# Patient Record
Sex: Female | Born: 1950
Health system: Southern US, Community
[De-identification: ages and names within clinical notes are randomized; demographics above are authoritative.]

## PROBLEM LIST (undated history)

## (undated) DIAGNOSIS — Z8742 Personal history of other diseases of the female genital tract: Secondary | ICD-10-CM

## (undated) DIAGNOSIS — C689 Malignant neoplasm of urinary organ, unspecified: Secondary | ICD-10-CM

## (undated) DIAGNOSIS — T4145XA Adverse effect of unspecified anesthetic, initial encounter: Secondary | ICD-10-CM

## (undated) DIAGNOSIS — F32A Depression, unspecified: Secondary | ICD-10-CM

## (undated) DIAGNOSIS — I2510 Atherosclerotic heart disease of native coronary artery without angina pectoris: Secondary | ICD-10-CM

## (undated) DIAGNOSIS — Z9989 Dependence on other enabling machines and devices: Secondary | ICD-10-CM

## (undated) DIAGNOSIS — T8859XA Other complications of anesthesia, initial encounter: Secondary | ICD-10-CM

## (undated) DIAGNOSIS — IMO0002 Reserved for concepts with insufficient information to code with codable children: Secondary | ICD-10-CM

## (undated) DIAGNOSIS — T7840XA Allergy, unspecified, initial encounter: Secondary | ICD-10-CM

## (undated) DIAGNOSIS — Z9889 Other specified postprocedural states: Secondary | ICD-10-CM

## (undated) DIAGNOSIS — E119 Type 2 diabetes mellitus without complications: Secondary | ICD-10-CM

## (undated) DIAGNOSIS — K219 Gastro-esophageal reflux disease without esophagitis: Secondary | ICD-10-CM

## (undated) DIAGNOSIS — J449 Chronic obstructive pulmonary disease, unspecified: Secondary | ICD-10-CM

## (undated) DIAGNOSIS — M1990 Unspecified osteoarthritis, unspecified site: Secondary | ICD-10-CM

## (undated) DIAGNOSIS — G4733 Obstructive sleep apnea (adult) (pediatric): Secondary | ICD-10-CM

## (undated) DIAGNOSIS — Z872 Personal history of diseases of the skin and subcutaneous tissue: Secondary | ICD-10-CM

## (undated) DIAGNOSIS — G4730 Sleep apnea, unspecified: Secondary | ICD-10-CM

## (undated) DIAGNOSIS — F329 Major depressive disorder, single episode, unspecified: Secondary | ICD-10-CM

## (undated) DIAGNOSIS — B029 Zoster without complications: Secondary | ICD-10-CM

## (undated) DIAGNOSIS — Z8719 Personal history of other diseases of the digestive system: Secondary | ICD-10-CM

## (undated) DIAGNOSIS — K5730 Diverticulosis of large intestine without perforation or abscess without bleeding: Secondary | ICD-10-CM

## (undated) DIAGNOSIS — E785 Hyperlipidemia, unspecified: Secondary | ICD-10-CM

## (undated) DIAGNOSIS — R112 Nausea with vomiting, unspecified: Secondary | ICD-10-CM

## (undated) DIAGNOSIS — F419 Anxiety disorder, unspecified: Secondary | ICD-10-CM

## (undated) HISTORY — DX: Gastro-esophageal reflux disease without esophagitis: K21.9

## (undated) HISTORY — PX: ESOPHAGOGASTRODUODENOSCOPY: SHX1529

## (undated) HISTORY — DX: Major depressive disorder, single episode, unspecified: F32.9

## (undated) HISTORY — DX: Atherosclerotic heart disease of native coronary artery without angina pectoris: I25.10

## (undated) HISTORY — DX: Obstructive sleep apnea (adult) (pediatric): Z99.89

## (undated) HISTORY — DX: Anxiety disorder, unspecified: F41.9

## (undated) HISTORY — DX: Obstructive sleep apnea (adult) (pediatric): G47.33

## (undated) HISTORY — PX: COLONOSCOPY: SHX174

## (undated) HISTORY — DX: Type 2 diabetes mellitus without complications: E11.9

## (undated) HISTORY — DX: Depression, unspecified: F32.A

## (undated) HISTORY — DX: Zoster without complications: B02.9

## (undated) HISTORY — DX: Sleep apnea, unspecified: G47.30

## (undated) HISTORY — DX: Malignant neoplasm of urinary organ, unspecified: C68.9

## (undated) HISTORY — DX: Allergy, unspecified, initial encounter: T78.40XA

## (undated) HISTORY — PX: SPINE SURGERY: SHX786

---

## 1981-08-26 HISTORY — PX: ABDOMINAL HYSTERECTOMY: SHX81

## 1998-10-05 ENCOUNTER — Inpatient Hospital Stay (HOSPITAL_COMMUNITY): Admission: AD | Admit: 1998-10-05 | Discharge: 1998-10-06 | Payer: Self-pay | Admitting: Cardiology

## 1998-10-06 HISTORY — PX: CARDIOVASCULAR STRESS TEST: SHX262

## 2000-04-07 ENCOUNTER — Ambulatory Visit (HOSPITAL_COMMUNITY): Admission: RE | Admit: 2000-04-07 | Discharge: 2000-04-07 | Payer: Self-pay | Admitting: Gastroenterology

## 2000-04-07 ENCOUNTER — Encounter: Payer: Self-pay | Admitting: Gastroenterology

## 2000-05-09 ENCOUNTER — Encounter: Payer: Self-pay | Admitting: Gastroenterology

## 2000-05-09 ENCOUNTER — Ambulatory Visit (HOSPITAL_COMMUNITY): Admission: RE | Admit: 2000-05-09 | Discharge: 2000-05-09 | Payer: Self-pay | Admitting: Gastroenterology

## 2000-10-03 ENCOUNTER — Encounter (INDEPENDENT_AMBULATORY_CARE_PROVIDER_SITE_OTHER): Payer: Self-pay

## 2000-10-03 ENCOUNTER — Other Ambulatory Visit: Admission: RE | Admit: 2000-10-03 | Discharge: 2000-10-03 | Payer: Self-pay | Admitting: Otolaryngology

## 2000-10-24 HISTORY — PX: SEPTOPLASTY: SUR1290

## 2001-10-16 ENCOUNTER — Other Ambulatory Visit: Admission: RE | Admit: 2001-10-16 | Discharge: 2001-10-16 | Payer: Self-pay | Admitting: Family Medicine

## 2001-12-15 ENCOUNTER — Encounter: Payer: Self-pay | Admitting: Gastroenterology

## 2001-12-15 ENCOUNTER — Ambulatory Visit (HOSPITAL_COMMUNITY): Admission: RE | Admit: 2001-12-15 | Discharge: 2001-12-15 | Payer: Self-pay | Admitting: Gastroenterology

## 2004-06-14 ENCOUNTER — Ambulatory Visit: Payer: Self-pay | Admitting: Family Medicine

## 2004-08-17 ENCOUNTER — Ambulatory Visit: Payer: Self-pay | Admitting: Family Medicine

## 2004-09-24 ENCOUNTER — Ambulatory Visit: Payer: Self-pay | Admitting: Family Medicine

## 2004-10-22 ENCOUNTER — Ambulatory Visit: Payer: Self-pay | Admitting: Family Medicine

## 2005-01-22 ENCOUNTER — Ambulatory Visit: Payer: Self-pay | Admitting: Family Medicine

## 2005-09-23 ENCOUNTER — Ambulatory Visit: Payer: Self-pay | Admitting: Internal Medicine

## 2005-11-25 ENCOUNTER — Ambulatory Visit: Payer: Self-pay | Admitting: Family Medicine

## 2006-03-20 ENCOUNTER — Ambulatory Visit: Payer: Self-pay | Admitting: Family Medicine

## 2006-05-09 ENCOUNTER — Ambulatory Visit: Payer: Self-pay | Admitting: Family Medicine

## 2006-05-15 ENCOUNTER — Ambulatory Visit: Payer: Self-pay | Admitting: Family Medicine

## 2006-06-02 ENCOUNTER — Ambulatory Visit: Payer: Self-pay | Admitting: Family Medicine

## 2006-08-13 ENCOUNTER — Ambulatory Visit: Payer: Self-pay | Admitting: Chiropractic Medicine

## 2006-12-26 ENCOUNTER — Telehealth (INDEPENDENT_AMBULATORY_CARE_PROVIDER_SITE_OTHER): Payer: Self-pay | Admitting: *Deleted

## 2006-12-29 ENCOUNTER — Ambulatory Visit: Payer: Self-pay | Admitting: Family Medicine

## 2006-12-29 DIAGNOSIS — B029 Zoster without complications: Secondary | ICD-10-CM

## 2006-12-29 HISTORY — DX: Zoster without complications: B02.9

## 2007-01-23 ENCOUNTER — Telehealth: Payer: Self-pay | Admitting: Family Medicine

## 2007-05-26 ENCOUNTER — Telehealth (INDEPENDENT_AMBULATORY_CARE_PROVIDER_SITE_OTHER): Payer: Self-pay | Admitting: *Deleted

## 2007-06-30 ENCOUNTER — Ambulatory Visit: Payer: Self-pay | Admitting: Family Medicine

## 2007-06-30 ENCOUNTER — Telehealth: Payer: Self-pay | Admitting: Family Medicine

## 2007-07-20 ENCOUNTER — Telehealth (INDEPENDENT_AMBULATORY_CARE_PROVIDER_SITE_OTHER): Payer: Self-pay | Admitting: *Deleted

## 2007-09-03 ENCOUNTER — Telehealth (INDEPENDENT_AMBULATORY_CARE_PROVIDER_SITE_OTHER): Payer: Self-pay | Admitting: *Deleted

## 2007-09-22 ENCOUNTER — Ambulatory Visit: Payer: Self-pay | Admitting: Family Medicine

## 2008-03-15 ENCOUNTER — Encounter: Payer: Self-pay | Admitting: Family Medicine

## 2008-03-15 ENCOUNTER — Telehealth: Payer: Self-pay | Admitting: Family Medicine

## 2008-03-15 DIAGNOSIS — K219 Gastro-esophageal reflux disease without esophagitis: Secondary | ICD-10-CM | POA: Insufficient documentation

## 2008-03-15 DIAGNOSIS — F341 Dysthymic disorder: Secondary | ICD-10-CM | POA: Insufficient documentation

## 2008-03-15 DIAGNOSIS — E1169 Type 2 diabetes mellitus with other specified complication: Secondary | ICD-10-CM | POA: Insufficient documentation

## 2008-03-15 DIAGNOSIS — K2990 Gastroduodenitis, unspecified, without bleeding: Secondary | ICD-10-CM

## 2008-03-15 DIAGNOSIS — K2980 Duodenitis without bleeding: Secondary | ICD-10-CM | POA: Insufficient documentation

## 2008-03-15 DIAGNOSIS — K2970 Gastritis, unspecified, without bleeding: Secondary | ICD-10-CM | POA: Insufficient documentation

## 2008-03-15 DIAGNOSIS — Z87448 Personal history of other diseases of urinary system: Secondary | ICD-10-CM | POA: Insufficient documentation

## 2008-03-15 DIAGNOSIS — I10 Essential (primary) hypertension: Secondary | ICD-10-CM | POA: Insufficient documentation

## 2008-03-15 DIAGNOSIS — E7800 Pure hypercholesterolemia, unspecified: Secondary | ICD-10-CM

## 2008-06-28 ENCOUNTER — Ambulatory Visit: Payer: Self-pay | Admitting: Family Medicine

## 2008-07-05 ENCOUNTER — Encounter: Payer: Self-pay | Admitting: Family Medicine

## 2008-07-05 ENCOUNTER — Ambulatory Visit: Payer: Self-pay | Admitting: Family Medicine

## 2008-07-07 ENCOUNTER — Encounter (INDEPENDENT_AMBULATORY_CARE_PROVIDER_SITE_OTHER): Payer: Self-pay | Admitting: *Deleted

## 2008-08-29 ENCOUNTER — Ambulatory Visit: Payer: Self-pay | Admitting: Family Medicine

## 2008-11-08 ENCOUNTER — Ambulatory Visit: Payer: Self-pay | Admitting: Family Medicine

## 2008-11-08 LAB — CONVERTED CEMR LAB
ALT: 15 units/L (ref 0–35)
AST: 18 units/L (ref 0–37)
Albumin: 3.4 g/dL — ABNORMAL LOW (ref 3.5–5.2)
Alkaline Phosphatase: 68 units/L (ref 39–117)
BUN: 13 mg/dL (ref 6–23)
Basophils Absolute: 0 10*3/uL (ref 0.0–0.1)
Basophils Relative: 0.2 % (ref 0.0–3.0)
Bilirubin, Direct: 0 mg/dL (ref 0.0–0.3)
CO2: 32 meq/L (ref 19–32)
Calcium: 9 mg/dL (ref 8.4–10.5)
Chloride: 104 meq/L (ref 96–112)
Cholesterol: 177 mg/dL (ref 0–200)
Creatinine, Ser: 0.8 mg/dL (ref 0.4–1.2)
Eosinophils Absolute: 0.3 10*3/uL (ref 0.0–0.7)
Eosinophils Relative: 3.6 % (ref 0.0–5.0)
GFR calc non Af Amer: 77.88 mL/min (ref >60)
Glucose, Bld: 100 mg/dL — ABNORMAL HIGH (ref 70–99)
HCT: 38.8 % (ref 36.0–46.0)
HDL: 54.2 mg/dL (ref >39.00)
Hemoglobin: 13.4 g/dL (ref 12.0–15.0)
LDL Cholesterol: 86 mg/dL (ref 0–99)
Lymphocytes Relative: 25.2 % (ref 12.0–46.0)
Lymphs Abs: 2.2 10*3/uL (ref 0.7–4.0)
MCHC: 34.5 g/dL (ref 30.0–36.0)
MCV: 91.7 fL (ref 78.0–100.0)
Monocytes Absolute: 0.6 10*3/uL (ref 0.1–1.0)
Monocytes Relative: 7.2 % (ref 3.0–12.0)
Neutro Abs: 5.6 10*3/uL (ref 1.4–7.7)
Neutrophils Relative %: 63.8 % (ref 43.0–77.0)
Platelets: 246 10*3/uL (ref 150.0–400.0)
Potassium: 3.9 meq/L (ref 3.5–5.1)
RBC: 4.24 M/uL (ref 3.87–5.11)
RDW: 12.4 % (ref 11.5–14.6)
Sodium: 143 meq/L (ref 135–145)
TSH: 1.28 microintl units/mL (ref 0.35–5.50)
Total Bilirubin: 0.7 mg/dL (ref 0.3–1.2)
Total CHOL/HDL Ratio: 3
Total Protein: 7.1 g/dL (ref 6.0–8.3)
Triglycerides: 183 mg/dL — ABNORMAL HIGH (ref 0.0–149.0)
VLDL: 36.6 mg/dL (ref 0.0–40.0)
WBC: 8.7 10*3/uL (ref 4.5–10.5)

## 2008-11-15 ENCOUNTER — Encounter: Payer: Self-pay | Admitting: Family Medicine

## 2008-11-15 ENCOUNTER — Other Ambulatory Visit: Admission: RE | Admit: 2008-11-15 | Discharge: 2008-11-15 | Payer: Self-pay | Admitting: Family Medicine

## 2008-11-15 ENCOUNTER — Ambulatory Visit: Payer: Self-pay | Admitting: Family Medicine

## 2008-11-17 ENCOUNTER — Encounter (INDEPENDENT_AMBULATORY_CARE_PROVIDER_SITE_OTHER): Payer: Self-pay | Admitting: *Deleted

## 2008-11-22 ENCOUNTER — Ambulatory Visit: Payer: Self-pay | Admitting: Family Medicine

## 2008-11-22 ENCOUNTER — Encounter (INDEPENDENT_AMBULATORY_CARE_PROVIDER_SITE_OTHER): Payer: Self-pay | Admitting: *Deleted

## 2008-11-22 LAB — CONVERTED CEMR LAB
OCCULT 1: NEGATIVE
OCCULT 2: NEGATIVE
OCCULT 3: NEGATIVE

## 2009-02-07 ENCOUNTER — Ambulatory Visit: Payer: Self-pay | Admitting: Family Medicine

## 2009-04-28 ENCOUNTER — Ambulatory Visit: Payer: Self-pay | Admitting: Family Medicine

## 2009-04-28 LAB — CONVERTED CEMR LAB: Rapid Strep: NEGATIVE

## 2009-05-05 ENCOUNTER — Telehealth (INDEPENDENT_AMBULATORY_CARE_PROVIDER_SITE_OTHER): Payer: Self-pay | Admitting: Internal Medicine

## 2009-05-22 ENCOUNTER — Ambulatory Visit: Payer: Self-pay | Admitting: Family Medicine

## 2009-05-22 DIAGNOSIS — T7840XA Allergy, unspecified, initial encounter: Secondary | ICD-10-CM | POA: Insufficient documentation

## 2009-06-16 ENCOUNTER — Telehealth: Payer: Self-pay | Admitting: Internal Medicine

## 2009-08-03 ENCOUNTER — Encounter: Payer: Self-pay | Admitting: Family Medicine

## 2009-10-09 ENCOUNTER — Ambulatory Visit: Payer: Self-pay | Admitting: Family Medicine

## 2009-11-14 ENCOUNTER — Ambulatory Visit: Payer: Self-pay | Admitting: Family Medicine

## 2009-11-14 LAB — CONVERTED CEMR LAB
ALT: 15 units/L (ref 0–35)
Albumin: 3.4 g/dL — ABNORMAL LOW (ref 3.5–5.2)
Alkaline Phosphatase: 72 units/L (ref 39–117)
Basophils Relative: 0.7 % (ref 0.0–3.0)
Bilirubin, Direct: 0.1 mg/dL (ref 0.0–0.3)
CO2: 31 meq/L (ref 19–32)
Chloride: 105 meq/L (ref 96–112)
Creatinine, Ser: 0.8 mg/dL (ref 0.4–1.2)
Eosinophils Relative: 4 % (ref 0.0–5.0)
Hemoglobin: 11.7 g/dL — ABNORMAL LOW (ref 12.0–15.0)
LDL Cholesterol: 74 mg/dL (ref 0–99)
MCHC: 33.4 g/dL (ref 30.0–36.0)
MCV: 91.3 fL (ref 78.0–100.0)
Monocytes Absolute: 0.6 10*3/uL (ref 0.1–1.0)
Neutro Abs: 4.5 10*3/uL (ref 1.4–7.7)
Neutrophils Relative %: 58.5 % (ref 43.0–77.0)
Potassium: 4 meq/L (ref 3.5–5.1)
RBC: 3.84 M/uL — ABNORMAL LOW (ref 3.87–5.11)
Sodium: 141 meq/L (ref 135–145)
Total CHOL/HDL Ratio: 3
Total Protein: 7.2 g/dL (ref 6.0–8.3)
Triglycerides: 159 mg/dL — ABNORMAL HIGH (ref 0.0–149.0)
WBC: 7.8 10*3/uL (ref 4.5–10.5)

## 2009-11-20 ENCOUNTER — Ambulatory Visit: Payer: Self-pay | Admitting: Family Medicine

## 2009-12-08 ENCOUNTER — Ambulatory Visit: Payer: Self-pay | Admitting: Family Medicine

## 2009-12-08 LAB — CONVERTED CEMR LAB: OCCULT 1: NEGATIVE

## 2009-12-08 LAB — FECAL OCCULT BLOOD, GUAIAC: Fecal Occult Blood: NEGATIVE

## 2009-12-11 ENCOUNTER — Encounter (INDEPENDENT_AMBULATORY_CARE_PROVIDER_SITE_OTHER): Payer: Self-pay | Admitting: *Deleted

## 2009-12-19 ENCOUNTER — Telehealth: Payer: Self-pay | Admitting: Family Medicine

## 2009-12-27 ENCOUNTER — Ambulatory Visit: Payer: Self-pay | Admitting: Family Medicine

## 2010-01-02 ENCOUNTER — Ambulatory Visit: Payer: Self-pay | Admitting: Family Medicine

## 2010-01-02 ENCOUNTER — Encounter: Payer: Self-pay | Admitting: Family Medicine

## 2010-01-03 ENCOUNTER — Encounter (INDEPENDENT_AMBULATORY_CARE_PROVIDER_SITE_OTHER): Payer: Self-pay | Admitting: *Deleted

## 2010-03-01 ENCOUNTER — Ambulatory Visit: Payer: Self-pay | Admitting: Family Medicine

## 2010-03-01 DIAGNOSIS — J309 Allergic rhinitis, unspecified: Secondary | ICD-10-CM

## 2010-03-01 DIAGNOSIS — G4733 Obstructive sleep apnea (adult) (pediatric): Secondary | ICD-10-CM | POA: Insufficient documentation

## 2010-04-03 ENCOUNTER — Telehealth: Payer: Self-pay | Admitting: Family Medicine

## 2010-04-03 ENCOUNTER — Encounter (INDEPENDENT_AMBULATORY_CARE_PROVIDER_SITE_OTHER): Payer: Self-pay | Admitting: *Deleted

## 2010-04-05 ENCOUNTER — Ambulatory Visit: Payer: Self-pay | Admitting: Pulmonary Disease

## 2010-05-09 ENCOUNTER — Encounter: Payer: Self-pay | Admitting: Pulmonary Disease

## 2010-05-09 ENCOUNTER — Ambulatory Visit (HOSPITAL_BASED_OUTPATIENT_CLINIC_OR_DEPARTMENT_OTHER): Admission: RE | Admit: 2010-05-09 | Discharge: 2010-05-09 | Payer: Self-pay | Admitting: Pulmonary Disease

## 2010-05-25 ENCOUNTER — Telehealth (INDEPENDENT_AMBULATORY_CARE_PROVIDER_SITE_OTHER): Payer: Self-pay | Admitting: *Deleted

## 2010-05-25 ENCOUNTER — Ambulatory Visit: Payer: Self-pay | Admitting: Pulmonary Disease

## 2010-06-12 ENCOUNTER — Ambulatory Visit: Payer: Self-pay | Admitting: Pulmonary Disease

## 2010-07-23 ENCOUNTER — Ambulatory Visit: Payer: Self-pay | Admitting: Pulmonary Disease

## 2010-08-02 ENCOUNTER — Ambulatory Visit: Payer: Self-pay | Admitting: Family Medicine

## 2010-08-02 ENCOUNTER — Telehealth: Payer: Self-pay | Admitting: Family Medicine

## 2010-09-20 ENCOUNTER — Ambulatory Visit
Admission: RE | Admit: 2010-09-20 | Discharge: 2010-09-20 | Payer: Self-pay | Source: Home / Self Care | Attending: Family Medicine | Admitting: Family Medicine

## 2010-09-25 NOTE — Progress Notes (Signed)
Summary: pt needs ov with kc to discuss sleep study results.   Phone Note Outgoing Call   Call placed by: Carver Fila,  May 25, 2010 9:02 AM Call placed to: Patient Summary of Call: [facility] x1. Pt needs ov with kc to discuss sleep study results.  Carver Fila  May 25, 2010 9:02 AM   Follow-up for Phone Call        left message with husband to give me a call back to schedule a follow up with kc to discuss sleep study results.  Carver Fila  May 28, 2010 9:14 AM   Additional Follow-up for Phone Call Additional follow up Details #1::        [email or web address] x 3 Mindy Silva  May 29, 2010 5:22 PM   pt called back and is coming in on 10/18 at 11:15 a.m. Carver Fila  May 30, 2010 9:39 AM

## 2010-09-25 NOTE — Assessment & Plan Note (Signed)
Summary: CHECK ? MOLE ON BACK   Vital Signs:  Patient profile:   60 year old female Weight:      187.75 pounds Temp:     98.4 degrees F oral Pulse rate:   76 / minute Pulse rhythm:   regular BP sitting:   136 / 66  (left arm) Cuff size:   regular  Vitals Entered By: Sydell Axon LPN ([birth year] 28, 1882 12:01 PM) CC: Check mole on back, itches at times   History of Present Illness: Pt here for itchy lesion she can feel on her back which due to the itching, she has scratched for the last week or so. She cannot see it so wanted it checked. She has no other problems and feels well.  Problems Prior to Update: 1)  Uri  (ICD-465.9) 2)  Allergy  (ICD-995.3) 3)  Special Screening Malig Neoplasms Other Sites  (ICD-V76.49) 4)  Health Maintenance Exam  (ICD-V70.0) 5)  Other Screening Mammogram  (ICD-V76.12) 6)  Neck Pain w/ Ue Numbness  (ICD-723.1) 7)  Hematuria, Hx of  (ICD-V13.09) 8)  Duodenitis  (ICD-535.60) 9)  Gastritis  (ICD-535.50) 10)  Gerd  (ICD-530.81) 11)  Essential Hypertension  (ICD-401.9) 12)  Hypercholesterolemia  (ICD-272.0) 13)  Anxiety Depression  (ICD-300.4) 14)  Dermatitis  (ICD-692.9) 15)  Herpes Zoster, Uncomplicated  (ICD-053.9)  Medications Prior to Update: 1)  Lipitor 10 Mg  Tabs (Atorvastatin Calcium) .Marland Kitchen.. 1 By Mouth At Bedtime 2)  Prilosec 40 Mg  Cpdr (Omeprazole) .... 45 Mins Prior To Brkfst Daily By Mouth 3)  Menest 0.625 Mg  Tabs (Esterified Estrogens) .... One Tab By Mouth Daily 4)  Amlodipine Besylate 10 Mg Tabs (Amlodipine Besylate) .... One Tab By Mouth At Night 5)  Prozac 20 Mg  Caps (Fluoxetine Hcl) .Marland Kitchen.. 1 Daily By Mouth 6)  Maxzide-25 37.5-25 Mg  Tabs (Triamterene-Hctz) .... Take One By Mouth Daily 7)  Guaifenesin 400 Mg Tabs (Guaifenesin) .... As Needed 8)  Clarithromycin 500 Mg Tabs (Clarithromycin) .... One Tab By Mouth Two Times A Day  Allergies: 1)  ! Ampicillin (Ampicillin) 2)  ! Vibramycin (Doxycycline Hyclate) 3)  ! Doxycycline Hyclate  (Doxycycline Hyclate) 4)  ! Tetracycline Hcl (Tetracycline Hcl) 5)  ! Toprol Xl  Physical Exam  General:  alert, well-developed, well-nourished, and well-hydrated.  Mildly congested. Head:  Normocephalic and atraumatic without obvious abnormalities. No apparent alopecia or balding. Sinuses NT. Eyes:  Conjunctiva clear bilaterally.  Skin:  5mm maculopapulr poorly demarcated reasonably round flesh-colored lesion in the mid back. No dimpling noted.   Impression & Recommendations:  Problem # 1:  MOLE OF SKIN OF BACK (ICD-216.9) Assessment New Looks benign. Will follow. Reassured. Remind when next seen to look again.  Complete Medication List: 1)  Lipitor 10 Mg Tabs (Atorvastatin calcium) .Marland Kitchen.. 1 by mouth at bedtime 2)  Prilosec 40 Mg Cpdr (Omeprazole) .... 45 mins prior to brkfst daily by mouth 3)  Menest 0.625 Mg Tabs (Esterified estrogens) .... One tab by mouth daily 4)  Amlodipine Besylate 10 Mg Tabs (Amlodipine besylate) .... One tab by mouth at night 5)  Prozac 20 Mg Caps (Fluoxetine hcl) .Marland Kitchen.. 1 daily by mouth 6)  Maxzide-25 37.5-25 Mg Tabs (Triamterene-hctz) .... Take one by mouth daily 7)  Guaifenesin 400 Mg Tabs (Guaifenesin) .... As needed  Current Allergies (reviewed today): ! AMPICILLIN (AMPICILLIN) ! VIBRAMYCIN (DOXYCYCLINE HYCLATE) ! DOXYCYCLINE HYCLATE (DOXYCYCLINE HYCLATE) ! TETRACYCLINE HCL (TETRACYCLINE HCL) ! TOPROL XL

## 2010-09-25 NOTE — Progress Notes (Signed)
Summary: Rx Fluroxetine  Phone Note Refill Request Message from:  [facility] on April 03, 2010 8:42 AM  Refills Requested: Medication #1:  PROZAC 20 MG  CAPS 1 daily by mouth No last refill date sent.   Method Requested: Electronic Initial call taken by: Sydell Axon LPN,  April 03, 2010 8:42 AM  Follow-up for Phone Call       Follow-up by: Crawford Givens MD,  April 03, 2010 9:26 AM    Prescriptions: PROZAC 20 MG  CAPS (FLUOXETINE HCL) 1 daily by mouth  #90 x 3   Entered and Authorized by:   Crawford Givens MD   Signed by:   Crawford Givens MD on 04/03/2010   Method used:   Faxed to ...       [facility] (mail-order)             , [place]         Ph: [phone]       Fax: [phone]   RxID:   [id number]

## 2010-09-25 NOTE — Miscellaneous (Signed)
  Clinical Lists Changes  Orders: Added new Service order of Est. Patient Level IV (99214) - Signed 

## 2010-09-25 NOTE — Letter (Signed)
Summary: Results Follow up Letter  [place] at [facility]  [address] [place], [place] [address]   Phone: [phone]  Fax: [phone]    01/03/2010 MRN: [id number]  [facility] [address] [place], [place]  [address]  Dear Ms. Wessels,  The following are the results of your recent test(s):  Test         Result    Pap Smear:        Normal _____  Not Normal _____ Comments: ______________________________________________________ Cholesterol: LDL(Bad cholesterol):         Your goal is less than:         HDL (Good cholesterol):       Your goal is more than: Comments:  ______________________________________________________ Mammogram:        Normal _X____  Not Normal _____ Comments: Please repeat in one year.  ___________________________________________________________________ Hemoccult:        Normal _____  Not normal _______ Comments:    _____________________________________________________________________ Other Tests:    We routinely do not discuss normal results over the telephone.  If you desire a copy of the results, or you have any questions about this information we can discuss them at your next office visit.   Sincerely,     Laurita Quint, MD

## 2010-09-25 NOTE — Progress Notes (Signed)
Summary: needs order for mammogram  Phone Note Call from Patient Call back at [phone]   Caller: Patient Call For: Shaune Leeks MD Summary of Call: Pt needs order for mammogram, she goes to [facility] clinic.   Initial call taken by: Lowella Petties CMA,  December 19, 2009 11:46 AM  Follow-up for Phone Call        Mammogram ordered...left message to inform pt.Daine Gip  December 20, 2009 5:02 PM Follow-up by: Daine Gip,  December 20, 2009 5:02 PM

## 2010-09-25 NOTE — Assessment & Plan Note (Signed)
Summary: consult for possible osa   Copy to:  Karleen Hampshire Copland Primary Provider/Referring Provider:  Hannah Beat  CC:  Sleep Consult.  History of Present Illness: The pt is a 60 y/o female who I have been asked to see for possible osa.  She has been noted to have loud snoring, as well as pauses in her breathing during sleep.  She goes to bed at 11pm, and arises at 6:30 am to start her day.  She is not rested upon arising, and has frequent awakenings at night.  She has significant sleep pressure with inactivity during the day, and has fallen asleep in front of her computer at work.  She is not satisfied with her degree of alertness and concentration  during the day.  She can doze if she sits down in the evening, and gets sleepy driving longer distances.  Her weight is up 10 pounds over the last 2 years, and her epworth score today is 17.    Medications Prior to Update: 1)  Lipitor 10 Mg  Tabs (Atorvastatin Calcium) .Marland Kitchen.. 1 By Mouth At Bedtime 2)  Prilosec 40 Mg  Cpdr (Omeprazole) .... 45 Mins Prior To Brkfst Daily By Mouth 3)  Menest 0.625 Mg  Tabs (Esterified Estrogens) .... One Tab By Mouth Daily 4)  Amlodipine Besylate 10 Mg Tabs (Amlodipine Besylate) .... One Tab By Mouth At Night 5)  Prozac 20 Mg  Caps (Fluoxetine Hcl) .Marland Kitchen.. 1 Daily By Mouth 6)  Maxzide-25 37.5-25 Mg  Tabs (Triamterene-Hctz) .... Take One By Mouth Daily 7)  Guaifenesin 400 Mg Tabs (Guaifenesin) .... As Needed 8)  Fluticasone Propionate 50 Mcg/act  Susp (Fluticasone Propionate) .... 2 Sprays Each Nostril Once Daily  Allergies (verified): 1)  ! Ampicillin (Ampicillin) 2)  ! Vibramycin (Doxycycline Hyclate) 3)  ! Doxycycline Hyclate (Doxycycline Hyclate) 4)  ! Tetracycline Hcl (Tetracycline Hcl) 5)  ! Toprol Xl  Past History:  Past Medical History: Reviewed history from 03/01/2010 and no changes required. ALLERGY (ICD-995.3) GERD (ICD-530.81) ESSENTIAL HYPERTENSION (ICD-401.9) HYPERCHOLESTEROLEMIA  (ICD-272.0) ANXIETY DEPRESSION (ICD-300.4)    Past Surgical History: NSVD X 2 (BREECH, MACRO) HYSTERECTOMY OVARIES INTACT  ENDOMETRIOSIS :(1983) ABD. ULTRASOUND -- NORMAL:(02/2000) EGD HIATAL HERNIA , ESOPH. :(03/2000) SEPTOPLASTY WITHWINDOWS(?) DR. CROSSLEY:(10/2000) HEMATURIA- FH  TRANSITIONAL  CELL CA OF KIDNEY ? WORK UP NEG (DR. SCOTT DONALDSON )(03/27/2001) Appendectomy 1983  Family History: Father: dec 62  ANEURYSM ? IN THROAT; DM; + MI  Mother: A 34 :(1974) KIDNEY and  LYMPHOMA CA/ NEPRECTOMY TUMOR OF LUNG Now smoking, deceased, T-cell cancer in kidney BROTHER A 64  MI AT 50YOA ETOH Smoke again BROTHER A 49 T-Cell RENAL CANCER AT 38 YOA REMOVED SISTER A 61 ELEVATED CHOLESTEROL;   DM SMOKER CV: + GM MI// + FATHER MI M AUNTS MI BROTHER MILD MI HBP: + BROTHERS X 2, THROUGHOUT FAMILY DM: + SISTER// + FATHER GOUT/ARTHRITIS: PROSTATE CANCER: MOTHER AND BROTHER RENAL CELL , CANCER  //LYMPHOMA BREAST/OVARIAN/UTERINE CANCER:  COLON CANCER: DEPRESSION: + SELF ETOH/DRUG ABUSE: NEGATIVE OTHER : NEGATIVE STROKE  Social History: Marital Status: DIVORCED and now single. 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  2 ppd.  quit 1988.   Review of Systems       The patient complains of hand/feet swelling and joint stiffness or pain.  The patient denies shortness of breath with activity, shortness of  breath at rest, productive cough, non-productive cough, coughing up blood, chest pain, irregular heartbeats, acid heartburn, indigestion, loss of appetite, weight change, abdominal pain, difficulty swallowing, sore throat, tooth/dental problems, headaches, nasal congestion/difficulty breathing through nose, sneezing, itching, ear ache, anxiety, depression, rash, change in color of mucus, and fever.    Vital Signs:  Patient profile:   60  year old female Height:      64 inches Weight:      185.13 pounds BMI:     31.89 O2 Sat:      94 % on Room air Temp:     97.8 degrees F oral Pulse rate:   67 / minute BP sitting:   132 / 64  (left arm) Cuff size:   regular  Vitals Entered By: Arman Filter LPN (April 05, 2010 2:58 PM)  O2 Flow:  Room air CC: Sleep Consult Comments Medications reviewed with patient Arman Filter LPN  April 05, 2010 2:58 PM    Physical Exam  General:  ow female in nad Eyes:  PERRLA and EOMI.   Nose:  deviated septum to left with narrowing right clear Mouth:  mild to moderate elongation of soft palate, normal uvula Neck:  no jvd, tmg, LN Lungs:  clear to auscultation Heart:  rrr, no mrg Abdomen:  soft and nontender, bs+ Extremities:  no edema noted, pulses intact distally no cyanosis Neurologic:  alert and oriented, moves all 4.   Impression & Recommendations:  Problem # 1:  OBSTRUCTIVE SLEEP APNEA (ICD-327.23) the pt's history is very suggestive of osa.  She has loud snoring, an abnormal breathing pattern during sleep, nonrestorative sleep, and inappropriate daytime sleepiness.  I have had a long discussion with the pt about sleep apnea, including its impact on QOL and CV health.  I think she needs to have a sleep study for diagnosis, and the pt is agreeable.    Other Orders: Consultation Level IV (04540) Sleep Disorder Referral (Sleep Disorder)  Patient Instructions: 1)  will set up for a sleep study, and will schedule followup once results available.

## 2010-09-25 NOTE — Assessment & Plan Note (Signed)
Summary: rov for osa   Visit Type:  Follow-up Copy to:  Karleen Hampshire Copland Primary Provider/Referring Provider:  Karleen Hampshire Copland  CC:  5 week f/u. pt states she wears cpap 5 days a week x 4-6 hrs a night. pt states she has air seeping through the mask. Pt states her mask feels loose occas then feels to tight. .  History of Present Illness: The pt comes in today for f/u of her severe osa.  She was started on cpap last visit, and has been trying to wear compliantly.  She is having issues with mask fit and leaks, and this is hurting compliance.  When she is able to wear most of the night, she sees a big difference in quality of sleep and daytime alertness.  She has no issue with pressure or humidity.  I have reminded her that she is on a moderate pressure level, and that we still need to optimize this for her.  Current Medications (verified): 1)  Lipitor 10 Mg  Tabs (Atorvastatin Calcium) .Marland Kitchen.. 1 By Mouth At Bedtime 2)  Prilosec 40 Mg  Cpdr (Omeprazole) .... 45 Mins Prior To Brkfst Daily By Mouth 3)  Menest 0.625 Mg  Tabs (Esterified Estrogens) .... One Tab By Mouth Daily 4)  Amlodipine Besylate 10 Mg Tabs (Amlodipine Besylate) .... One Tab By Mouth At Night 5)  Prozac 20 Mg  Caps (Fluoxetine Hcl) .Marland Kitchen.. 1 Daily By Mouth 6)  Maxzide-25 37.5-25 Mg  Tabs (Triamterene-Hctz) .... Take One By Mouth Daily 7)  Guaifenesin 400 Mg Tabs (Guaifenesin) .... As Needed 8)  Fluticasone Propionate 50 Mcg/act  Susp (Fluticasone Propionate) .... 2 Sprays Each Nostril Once Daily  Allergies (verified): 1)  ! Ampicillin (Ampicillin) 2)  ! Vibramycin (Doxycycline Hyclate) 3)  ! Doxycycline Hyclate (Doxycycline Hyclate) 4)  ! Tetracycline Hcl (Tetracycline Hcl) 5)  ! Toprol Xl  Past History:  Past medical, surgical, family and social histories (including risk factors) reviewed, and no changes noted (except as noted below).  Past Medical History: Reviewed history from 03/01/2010 and no changes required. ALLERGY  (ICD-995.3) GERD (ICD-530.81) ESSENTIAL HYPERTENSION (ICD-401.9) HYPERCHOLESTEROLEMIA (ICD-272.0) ANXIETY DEPRESSION (ICD-300.4)    Past Surgical History: Reviewed history from 04/05/2010 and no changes required. NSVD X 2 (BREECH, MACRO) HYSTERECTOMY OVARIES INTACT  ENDOMETRIOSIS :(1983) ABD. ULTRASOUND -- NORMAL:(02/2000) EGD HIATAL HERNIA , ESOPH. :(03/2000) SEPTOPLASTY WITHWINDOWS(?) DR. CROSSLEY:(10/2000) HEMATURIA- FH  TRANSITIONAL  CELL CA OF KIDNEY ? WORK UP NEG (DR. SCOTT DONALDSON )(03/27/2001) Appendectomy 1983  Family History: Reviewed history from 04/05/2010 and no changes required. Father: dec 62  ANEURYSM ? IN THROAT; DM; + MI  Mother: A 48 :(1974) KIDNEY and  LYMPHOMA CA/ NEPRECTOMY TUMOR OF LUNG Now smoking, deceased, T-cell cancer in kidney BROTHER A 64  MI AT 50YOA ETOH Smoke again BROTHER A 49 T-Cell RENAL CANCER AT 38 YOA REMOVED SISTER A 61 ELEVATED CHOLESTEROL;   DM SMOKER CV: + GM MI// + FATHER MI M AUNTS MI BROTHER MILD MI HBP: + BROTHERS X 2, THROUGHOUT FAMILY DM: + SISTER// + FATHER GOUT/ARTHRITIS: PROSTATE CANCER: MOTHER AND BROTHER RENAL CELL , CANCER  //LYMPHOMA BREAST/OVARIAN/UTERINE CANCER:  COLON CANCER: DEPRESSION: + SELF ETOH/DRUG ABUSE: NEGATIVE OTHER : NEGATIVE STROKE  Social History: Reviewed history from 04/05/2010 and no changes required. Marital Status: DIVORCED and now single. 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  2 ppd.  quit 1988.   Review of Systems       The patient complains of productive cough, headaches, nasal congestion/difficulty breathing through nose, sneezing, and joint stiffness or pain.  The patient denies shortness of breath with activity, shortness of breath at rest, non-productive cough, coughing up blood, chest pain, irregular heartbeats, acid heartburn,  indigestion, loss of appetite, weight change, abdominal pain, difficulty swallowing, sore throat, tooth/dental problems, itching, ear ache, anxiety, depression, hand/feet swelling, rash, change in color of mucus, and fever.    Vital Signs:  Patient profile:   60 year old female Height:      64 inches Weight:      182.50 pounds BMI:     31.44 O2 Sat:      98 % on Room air Temp:     97.8 degrees F oral Pulse rate:   63 / minute BP sitting:   126 / 68  (left arm) Cuff size:   regular  Vitals Entered By: Carver Fila (July 23, 2010 12:07 PM)  O2 Flow:  Room air CC: 5 week f/u. pt states she wears cpap 5 days a week x 4-6 hrs a night. pt states she has air seeping through the mask. Pt states her mask feels loose occas then feels to tight.  Comments meds and allergies updated Phone number updated Carver Fila  July 23, 2010 12:08 PM    Physical Exam  General:  ow female in nad Nose:  no skin breakdown or pressure necrosis from cpap mask Lungs:  clear Extremities:  no edema noted, no cyanosis  Neurologic:  alert and oriented, does not appear sleepy moves all 4.   Impression & Recommendations:  Problem # 1:  OBSTRUCTIVE SLEEP APNEA (ICD-327.23) the pt is trying to wear cpap as much as possible, but is having issues with mask fit.  When she is able to wear consistently, her sleep and daytime alertness is much improved.  We need to work on mask fit, and will send her to the sleep center for evaluation.  Once this is done, will need to optimize pressure for her with auto setting.  I have also encouraged the pt to work on weight loss. Care Plan:  At this point, will arrange for the patient's machine to be changed over to auto mode for 2 weeks to optimize their pressure.  I will review the downloaded data once sent by dme, and also evaluate for compliance, leaks, and residual osa.  I will call the patient and dme to discuss the results, and have the patient's machine set appropriately.   This will serve as the pt's cpap pressure titration.  Other Orders: Est. Patient Level IV ([address]) DME Referral (DME) Sleep Disorder Referral (Sleep Disorder)  Patient Instructions: 1)  will refer to sleep center to work on mask fit 2)  will have dme change your machine over to automatic mode for the next 2 weeks to optimize your pressure.  Will call you with the results. 3)  work on weight loss 4)  if doing well, will see you back in 6mos.

## 2010-09-25 NOTE — Letter (Signed)
Summary: Nadara Eaton letter  [place] at [facility]  [address] [place], [place] [address]   Phone: [phone]  Fax: [phone]       04/03/2010 MRN: [id number]  [facility] [address] [place], [place]  [address]  Dear Ms. Crigler,  [facility] [place], and [place] announce the retirement of Arta Silence, M.D., from full-time practice at the [facility] office effective February 22, 2010 and his plans of returning part-time.  It is important to Dr. Hetty Ely and to our practice that you understand that [facility] has seven physicians in our office for your health care needs.  We will continue to offer the same exceptional care that you have today.    Dr. Hetty Ely has spoken to many of you about his plans for retirement and returning part-time in the fall.   We will continue to work with you through the transition to schedule appointments for you in the office and meet the high standards that [place] is committed to.   Again, it is with great pleasure that we share the news that Dr. Hetty Ely will return to [facility] in October of 2011 with a reduced schedule.    If you have any questions, or would like to request an appointment with one of our physicians, please call us at [phone] and press the option for Scheduling an appointment.  We take pleasure in providing you with excellent patient care and look forward to seeing you at your next office visit.  Our [facility] Physicians are:  Tillman Abide, M.D. Laurita Quint, M.D. Roxy Manns, M.D. Kerby Nora, M.D. Hannah Beat, M.D. Ruthe Mannan, M.D. We proudly welcomed Raechel Ache, M.D. and Eustaquio Boyden, M.D. to the practice in July/August 2011.  Sincerely,   [facility]

## 2010-09-25 NOTE — Letter (Signed)
Summary: Results Follow up Letter  [place] at [facility]  [address] [place], [place] [address]   Phone: [phone]  Fax: [phone]    12/11/2009 MRN: [id number]  [facility] [address] [place], [place]  [address]  Dear Ms. Sundell,  The following are the results of your recent test(s):  Test         Result    Pap Smear:        Normal _____  Not Normal _____ Comments: ______________________________________________________ Cholesterol: LDL(Bad cholesterol):         Your goal is less than:         HDL (Good cholesterol):       Your goal is more than: Comments:  ______________________________________________________ Mammogram:        Normal _____  Not Normal _____ Comments:  ___________________________________________________________________ Hemoccult:        Normal _X____  Not normal _______ Comments:  Please repeat in one year.  _____________________________________________________________________ Other Tests:    We routinely do not discuss normal results over the telephone.  If you desire a copy of the results, or you have any questions about this information we can discuss them at your next office visit.   Sincerely,     Laurita Quint, MD

## 2010-09-25 NOTE — Assessment & Plan Note (Signed)
Summary: SINUS SYMPTOMS   Vital Signs:  Patient profile:   60 year old female Weight:      186 pounds Temp:     98.6 degrees F oral Pulse rate:   76 / minute Pulse rhythm:   regular BP sitting:   140 / 70  (left arm) Cuff size:   regular  Vitals Entered By: Sydell Axon LPN (October 09, 2009 11:52 AM) CC: Sinus pressure, sinus congestion, diarrhea and starting with a non-productive cough this morning, Hypertension Management   History of Present Illness: Pt has had congestive sxs for a month. She has been using [employer] and it has not cleared. Shehas not had fever or chills. She has headache right sided periorbital. No ear pain, minimal rhinitis but lots of nasal coingestion. Some ST with drainage, minimal cough, nonproductive wth some SOB, minimal nausea, no vomiting, diarrhea began yesterday.   Hypertension History:      Positive major cardiovascular risk factors include female age 6 years old or older, hyperlipidemia, and hypertension.  Negative major cardiovascular risk factors include non-tobacco-user status.     Problems Prior to Update: 1)  Allergy  (ICD-995.3) 2)  Special Screening Malig Neoplasms Other Sites  (ICD-V76.49) 3)  Health Maintenance Exam  (ICD-V70.0) 4)  Other Screening Mammogram  (ICD-V76.12) 5)  Neck Pain w/ Ue Numbness  (ICD-723.1) 6)  Hematuria, Hx of  (ICD-V13.09) 7)  Duodenitis  (ICD-535.60) 8)  Gastritis  (ICD-535.50) 9)  Gerd  (ICD-530.81) 10)  Essential Hypertension  (ICD-401.9) 11)  Hypercholesterolemia  (ICD-272.0) 12)  Anxiety Depression  (ICD-300.4) 13)  Dermatitis  (ICD-692.9) 14)  Herpes Zoster, Uncomplicated  (ICD-053.9)  Medications Prior to Update: 1)  Lipitor 10 Mg  Tabs (Atorvastatin Calcium) .Marland Kitchen.. 1 By Mouth At Bedtime 2)  Prilosec 40 Mg  Cpdr (Omeprazole) .... 45 Mins Prior To Brkfst Daily By Mouth 3)  Menest 0.625 Mg  Tabs (Esterified Estrogens) .... One Tab By Mouth Daily 4)  Amlodipine Besylate 10 Mg Tabs (Amlodipine  Besylate) .... One Tab By Mouth At Night 5)  Prozac 20 Mg  Caps (Fluoxetine Hcl) .Marland Kitchen.. 1 Daily By Mouth 6)  Maxzide-25 37.5-25 Mg  Tabs (Triamterene-Hctz) .... Take One By Mouth Daily 7)  Nasonex 50 Mcg/act Susp (Mometasone Furoate) .... One Inhalation Each Nostril Once A Day.  Allergies: 1)  ! Ampicillin (Ampicillin) 2)  ! Vibramycin (Doxycycline Hyclate) 3)  ! Doxycycline Hyclate (Doxycycline Hyclate) 4)  ! Tetracycline Hcl (Tetracycline Hcl) 5)  ! Toprol Xl  Physical Exam  General:  alert, well-developed, well-nourished, and well-hydrated.  Mildly congested. Head:  Normocephalic and atraumatic without obvious abnormalities. No apparent alopecia or balding. Sinuses exquisitely tender over right max and frontal. Eyes:  Conjunctiva clear bilaterally.  Ears:  External ear exam shows no significant lesions or deformities.  Otoscopic examination reveals clear canals, tympanic membranes are intact bilaterally without bulging, retraction, inflammation or discharge. Hearing is grossly normal bilaterally. Nose:  External nasal examination shows no deformity or inflammation. Nasal mucosa are pink and moist without lesions or exudates. Mouth:  Oral mucosa and oropharynx without lesions or exudates.  Teeth in good repair. Neck:  No deformities, masses, or tenderness noted. Lungs:  moist harsh cough, no crackles and no wheezes.   Heart:  Normal rate and regular rhythm. S1 and S2 normal without gallop, murmur, click, rub or other extra sounds.   Impression & Recommendations:  Problem # 1:  SINUSITIS - ACUTE-NOS (ICD-461.9) Assessment New Cont everything she is doing.  Add Biaxin generic for 2 weeks and add Vicks at night. The following medications were removed from the medication list:    Nasonex 50 Mcg/act Susp (Mometasone furoate) ..... One inhalation each nostril once a day. Her updated medication list for this problem includes:    Guaifenesin 400 Mg Tabs (Guaifenesin) .Marland Kitchen... As needed     Clarithromycin 500 Mg Tabs (Clarithromycin) ..... One tab by mouth two times a day  Complete Medication List: 1)  Lipitor 10 Mg Tabs (Atorvastatin calcium) .Marland Kitchen.. 1 by mouth at bedtime 2)  Prilosec 40 Mg Cpdr (Omeprazole) .... 45 mins prior to brkfst daily by mouth 3)  Menest 0.625 Mg Tabs (Esterified estrogens) .... One tab by mouth daily 4)  Amlodipine Besylate 10 Mg Tabs (Amlodipine besylate) .... One tab by mouth at night 5)  Prozac 20 Mg Caps (Fluoxetine hcl) .Marland Kitchen.. 1 daily by mouth 6)  Maxzide-25 37.5-25 Mg Tabs (Triamterene-hctz) .... Take one by mouth daily 7)  Guaifenesin 400 Mg Tabs (Guaifenesin) .... As needed 8)  Clarithromycin 500 Mg Tabs (Clarithromycin) .... One tab by mouth two times a day  Hypertension Assessment/Plan:      The patient's hypertensive risk group is category B: At least one risk factor (excluding diabetes) with no target organ damage.  Her calculated 10 year risk of coronary heart disease is 9 %.  Today's blood pressure is 140/70.    Patient Instructions: 1)  RTC or call if sxs increase. Prescriptions: CLARITHROMYCIN 500 MG TABS (CLARITHROMYCIN) one tab by mouth two times a day  #28 x 0   Entered and Authorized by:   Shaune Leeks MD   Signed by:   Shaune Leeks MD on 10/09/2009   Method used:   Electronically to        [facility] [address] (retail)       [address]       [place], [place]  [address]       Ph: [phone]       Fax: [phone]   RxID:   [id number]   Current Allergies (reviewed today): ! AMPICILLIN (AMPICILLIN) ! VIBRAMYCIN (DOXYCYCLINE HYCLATE) ! DOXYCYCLINE HYCLATE (DOXYCYCLINE HYCLATE) ! TETRACYCLINE HCL (TETRACYCLINE HCL) ! TOPROL XL

## 2010-09-25 NOTE — Assessment & Plan Note (Signed)
Summary: CPX/DLO   Vital Signs:  Patient profile:   60 year old female Weight:      185.75 pounds Temp:     98.1 degrees F oral Pulse rate:   68 / minute Pulse rhythm:   regular BP sitting:   132 / 68  (left arm) Cuff size:   regular  Vitals Entered By: Sydell Axon LPN (November 20, 2009 1:55 PM) CC: 30 minute checkup, hemoccult cards given to patient, has had a hysterectomy   History of Present Illness: Pt's grandaughter has a cold and was with her over the weekend. She has now started  coughing. She is taking Guaifenesin. She is slightly congested but otherwise doing well.  Shre otherwise has no complaints.  Preventive Screening-Counseling & Management  Alcohol-Tobacco     Alcohol drinks/day: <1     Alcohol type: wine     Smoking Status: quit     Packs/Day: 11/2     Year Started: 1969     Year Quit: 1988     Pack years: 27  Caffeine-Diet-Exercise     Caffeine use/day: 2     Does Patient Exercise: no  Problems Prior to Update: 1)  Allergy  (ICD-995.3) 2)  Special Screening Malig Neoplasms Other Sites  (ICD-V76.49) 3)  Health Maintenance Exam  (ICD-V70.0) 4)  Other Screening Mammogram  (ICD-V76.12) 5)  Neck Pain w/ Ue Numbness  (ICD-723.1) 6)  Hematuria, Hx of  (ICD-V13.09) 7)  Duodenitis  (ICD-535.60) 8)  Gastritis  (ICD-535.50) 9)  Gerd  (ICD-530.81) 10)  Essential Hypertension  (ICD-401.9) 11)  Hypercholesterolemia  (ICD-272.0) 12)  Anxiety Depression  (ICD-300.4) 13)  Dermatitis  (ICD-692.9) 14)  Herpes Zoster, Uncomplicated  (ICD-053.9)  Medications Prior to Update: 1)  Lipitor 10 Mg  Tabs (Atorvastatin Calcium) .Marland Kitchen.. 1 By Mouth At Bedtime 2)  Prilosec 40 Mg  Cpdr (Omeprazole) .... 45 Mins Prior To Brkfst Daily By Mouth 3)  Menest 0.625 Mg  Tabs (Esterified Estrogens) .... One Tab By Mouth Daily 4)  Amlodipine Besylate 10 Mg Tabs (Amlodipine Besylate) .... One Tab By Mouth At Night 5)  Prozac 20 Mg  Caps (Fluoxetine Hcl) .Marland Kitchen.. 1 Daily By Mouth 6)  Maxzide-25  37.5-25 Mg  Tabs (Triamterene-Hctz) .... Take One By Mouth Daily 7)  Guaifenesin 400 Mg Tabs (Guaifenesin) .... As Needed 8)  Clarithromycin 500 Mg Tabs (Clarithromycin) .... One Tab By Mouth Two Times A Day  Allergies: 1)  ! Ampicillin (Ampicillin) 2)  ! Vibramycin (Doxycycline Hyclate) 3)  ! Doxycycline Hyclate (Doxycycline Hyclate) 4)  ! Tetracycline Hcl (Tetracycline Hcl) 5)  ! Toprol Xl  Past History:  Past Surgical History: Last updated: 03/15/2008 NSVD X 2 (BREECH, MACRO) HYSTERECTOMY OVARIES INTACT  ENDOMETRIOSIS :(1983) ABD. ULTRASOUND -- NORMAL:(02/2000) EGD HIATAL HERNIA , ESOPH. :(03/2000) SEPTOPLASTY WITHWINDOWS(?) DR. CROSSLEY:(10/2000) HEMATURIA- FH  TRANSITIONAL  CELL CA OF KIDNEY ? WORK UP NEG (DR. SCOTT DONALDSON )(03/27/2001)  Family History: Last updated: 11/20/2009 Father: dec 62  ANEURYSM ? IN THROAT; DM; + MI  Mother: A 52 :(1974) KIDNEY and  LYMPHOMA CA/ NEPRECTOMY TUMOR OF LUNG Now smoking BROTHER A 62  MI AT 50YOA ETOH Smoke again BROTHER A 49 RENAL CANCER AT 38 YOA REMOVED SISTER A 61 ELEVATED CHOLESTEROL;   DM SMOKER CV: + GM MI// + FATHER MI M AUNTS MI BROTHER MILD MI HBP: + BROTHERS X 2, THROUGHOUT FAMILY DM: + SISTER// + FATHER GOUT/ARTHRITIS: PROSTATE CANCER: MOTHER AND BROTHER RENAL CELL , CANCER  //LYMPHOMA BREAST/OVARIAN/UTERINE CANCER:  COLON CANCER: DEPRESSION: + SELF ETOH/DRUG ABUSE: NEGATIVE OTHER : NEGATIVE STROKE  Social History: Last updated: 11/15/2008 Marital Status: DIVORCED Daughter lives w/ her  Children: 2 CHILDREN //1 DAUGHTER LIVES WITH MOTHER/SON LIVES WITH FATHER Occupation: [employer]  Risk Factors: Alcohol Use: <1 (11/20/2009) Caffeine Use: 2 (11/20/2009) Exercise: no (11/20/2009)  Risk Factors: Smoking Status: quit (11/20/2009) Packs/Day: 11/2 (11/20/2009)  Family History: Father: dec 62  ANEURYSM ? IN THROAT; DM; + MI  Mother: A 27 :(1974) KIDNEY and  LYMPHOMA CA/ NEPRECTOMY TUMOR OF LUNG Now  smoking BROTHER A 62  MI AT 50YOA ETOH Smoke again BROTHER A 49 RENAL CANCER AT 38 YOA REMOVED SISTER A 61 ELEVATED CHOLESTEROL;   DM SMOKER CV: + GM MI// + FATHER MI M AUNTS MI BROTHER MILD MI HBP: + BROTHERS X 2, THROUGHOUT FAMILY DM: + SISTER// + FATHER GOUT/ARTHRITIS: PROSTATE CANCER: MOTHER AND BROTHER RENAL CELL , CANCER  //LYMPHOMA BREAST/OVARIAN/UTERINE CANCER:  COLON CANCER: DEPRESSION: + SELF ETOH/DRUG ABUSE: NEGATIVE OTHER : NEGATIVE STROKE  Review of Systems General:  Denies chills, fatigue, fever, sweats, weakness, and weight loss. Eyes:  Denies blurring, discharge, and eye pain. ENT:  Denies decreased hearing, earache, and ringing in ears. CV:  Denies chest pain or discomfort, fainting, fatigue, palpitations, shortness of breath with exertion, swelling of feet, and swelling of hands. Resp:  Denies cough, shortness of breath, and wheezing. GI:  Denies abdominal pain, bloody stools, change in bowel habits, constipation, dark tarry stools, diarrhea, excessive appetite, loss of appetite, nausea, vomiting, vomiting blood, and yellowish skin color. GU:  Complains of nocturia; denies discharge, incontinence, urinary frequency, and urinary hesitancy; one to two. MS:  Complains of joint pain and low back pain; denies muscle aches, cramps, and stiffness; rigtht knee  right hand. Derm:  Denies dryness, itching, and rash. Neuro:  Denies numbness, poor balance, tingling, and tremors; mild foot neuropathy. She has plantar fasciitis.  Physical Exam  General:  alert, well-developed, well-nourished, and well-hydrated.  Mildly congested. Head:  Normocephalic and atraumatic without obvious abnormalities. No apparent alopecia or balding. Sinuses NT. Eyes:  Conjunctiva clear bilaterally.  Ears:  External ear exam shows no significant lesions or deformities.  Otoscopic examination reveals clear canals, tympanic membranes are intact bilaterally without bulging, retraction, inflammation or  discharge. Hearing is grossly normal bilaterally. Nose:  External nasal examination shows no deformity or inflammation. Nasal mucosa are pink and moist without lesions or exudates. Mouth:  Oral mucosa and oropharynx without lesions or exudates.  Teeth in good repair. Neck:  No deformities, masses, or tenderness noted. Chest Wall:  No deformities, masses, or tenderness noted. Breasts:  No mass, nodules, thickening, tenderness, bulging, retraction, inflamation, nipple discharge or skin changes noted.   Lungs:  Moist harsh cough, no crackles and no wheezes.   Heart:  Normal rate and regular rhythm. S1 and S2 normal without gallop, murmur, click, rub or other extra sounds. Abdomen:  Bowel sounds positive,abdomen soft and non-tender without masses, organomegaly or hernias noted. Rectal:  No external abnormalities noted. Normal sphincter tone. No rectal masses or tenderness. G neg. Genitalia:  Bimanual only done Introitus wnl, Uterus and Cervix absent, Adnexa nontender w/o mass, ovaries not felt.  Msk:  No deformity or scoliosis noted of thoracic or lumbar spine.   Pulses:  R and L carotid,radial,femoral,dorsalis pedis and posterior tibial pulses are full and equal bilaterally Extremities:  No clubbing, cyanosis, edema, or deformity noted with normal full range of motion of all joints.   Neurologic:  No cranial nerve deficits noted. Station and gait are normal. Sensory, motor and coordinative functions appear intact. Skin:  Intact without suspicious lesions or rashes Cervical Nodes:  No lymphadenopathy noted Axillary Nodes:  No palpable lymphadenopathy Inguinal Nodes:  No significant adenopathy Psych:  Cognition and judgment appear intact. Alert and cooperative with normal attention span and concentration. No apparent delusions, illusions, hallucinations   Impression & Recommendations:  Problem # 1:  HEALTH MAINTENANCE EXAM (ICD-V70.0) Assessment Comment Only Discussed diet and exercise.  Discussed labs and weight loss.  Problem # 2:  OTHER SCREENING MAMMOGRAM (ICD-V76.12) Assessment: Unchanged Call and reschedule mammo she did not go for.  Problem # 3:  NECK PAIN W/ UE NUMBNESS (ICD-723.1) Assessment: Improved  Reasonably stable. Has minimal discomfort today and most days.  Discussed exercises and use of moist heat or cold and medication.   Problem # 4:  GERD (ICD-530.81) Assessment: Unchanged  Stable and well controlled on Prilosec. Her updated medication list for this problem includes:    Prilosec 40 Mg Cpdr (Omeprazole) .Marland KitchenMarland KitchenMarland KitchenMarland Kitchen 45 mins prior to brkfst daily by mouth  Diagnostics Reviewed:  Discussed lifestyle modifications, diet, antacids/medications, and preventive measures. Handout provided.   Problem # 5:  ESSENTIAL HYPERTENSION (ICD-401.9) Assessment: Unchanged Stable. Her updated medication list for this problem includes:    Amlodipine Besylate 10 Mg Tabs (Amlodipine besylate) ..... One tab by mouth at night    Maxzide-25 37.5-25 Mg Tabs (Triamterene-hctz) .Marland Kitchen... Take one by mouth daily  BP today: 132/68 Prior BP: 140/70 (10/09/2009)  Prior 10 Yr Risk Heart Disease: 9 % (10/09/2009)  Labs Reviewed: K+: 4.0 (11/14/2009) Creat: : 0.8 (11/14/2009)   Chol: 169 (11/14/2009)   HDL: 62.90 (11/14/2009)   LDL: 74 (11/14/2009)   TG: 159.0 (11/14/2009)  Problem # 6:  HYPERCHOLESTEROLEMIA (ICD-272.0) Assessment: Unchanged Well controlled. Cont Lip[itor and discussed diet. Her updated medication list for this problem includes:    Lipitor 10 Mg Tabs (Atorvastatin calcium) .Marland Kitchen... 1 by mouth at bedtime  Labs Reviewed: SGOT: 18 (11/14/2009)   SGPT: 15 (11/14/2009)  Prior 10 Yr Risk Heart Disease: 9 % (10/09/2009)   HDL:62.90 (11/14/2009), 54.20 (11/08/2008)  LDL:74 (11/14/2009), 86 (11/08/2008)  Chol:169 (11/14/2009), 177 (11/08/2008)  Trig:159.0 (11/14/2009), 183.0 (11/08/2008)  Problem # 7:  ANXIETY DEPRESSION (ICD-300.4) Assessment: Unchanged Stable, cont  meds. She has tried tapering in the past and did not go well.  Cont.  Problem # 8:  HERPES ZOSTER, UNCOMPLICATED (ICD-053.9) Assessment: Unchanged Suggested getting Zostavax...at drug store is cheapest.  Problem # 9:  URI (ICD-465.9) Assessment: New Cont Taking  Guaifenesin by [facility] or [facility] and getting MUCOUS RELIEF EXPECTORANT (400mg ), take 11/2 tabs by mouth AM and NOON. Tyl as needed. Gargle and lozenge as discussed. Call  if worsens. Drink lots of fluids anytime taking Guaifenesin.  Her updated medication list for this problem includes:    Guaifenesin 400 Mg Tabs (Guaifenesin) .Marland Kitchen... As needed  Complete Medication List: 1)  Lipitor 10 Mg Tabs (Atorvastatin calcium) .Marland Kitchen.. 1 by mouth at bedtime 2)  Prilosec 40 Mg Cpdr (Omeprazole) .... 45 mins prior to brkfst daily by mouth 3)  Menest 0.625 Mg Tabs (Esterified estrogens) .... One tab by mouth daily 4)  Amlodipine Besylate 10 Mg Tabs (Amlodipine besylate) .... One tab by mouth at night 5)  Prozac 20 Mg Caps (Fluoxetine hcl) .Marland Kitchen.. 1 daily by mouth 6)  Maxzide-25 37.5-25 Mg Tabs (Triamterene-hctz) .... Take one by mouth daily 7)  Guaifenesin 400 Mg Tabs (Guaifenesin) .Marland KitchenMarland KitchenMarland Kitchen  As needed 8)  Clarithromycin 500 Mg Tabs (Clarithromycin) .... One tab by mouth two times a day  PAP Screening:    Last PAP smear:  11/15/2008  Mammogram Screening:    Last Mammogram:  07/05/2008  Osteoporosis Risk Assessment:  Risk Factors for Fracture or Low Bone Density:   Race (White or Asian):     yes   Smoking status:       quit  Immunization & Chemoprophylaxis:    Tetanus vaccine: Td  (02/11/2004)  Patient Instructions: 1)  RTC one year, sooner as needed.

## 2010-09-25 NOTE — Assessment & Plan Note (Signed)
Summary: SINUS PROBLEMS   Vital Signs:  Patient profile:   60 year old female Height:      64 inches Weight:      180.0 pounds BMI:     31.01 Temp:     98.5 degrees F oral Pulse rate:   76 / minute Pulse rhythm:   regular BP sitting:   138 / 70  (left arm) Cuff size:   regular  Vitals Entered By: Benny Lennert CMA Duncan Dull) (March 01, 2010 9:02 AM)  History of Present Illness: Chief complaint sinus problems  60 year old female:  sinusitis R sided eye bad, throat pain, diarrhea, throwing up.  Sinuses have been bothering her about all the the time  AR Nasonex does help. Takes some Allegra if she needs it. Also the neti pot.     ? OSA - snores really bad. told stops breathing in the night. wakes up 4-5 times during the night.   Acute Visit History:      The patient complains of diarrhea, earache, headache, nasal discharge, sinus problems, and sore throat.  These symptoms began 1 week ago.  She denies abdominal pain, chest pain, cough, fever, and vomiting.        She complains of sinus pressure, teeth aching, ears being blocked, nasal congestion, purulent drainage, and frontal headache.  The patient has had a past history of sinusitis and previous sinus surgery.        Urine output has been normal.  She is tolerating clear liquids.        Allergies: 1)  ! Ampicillin (Ampicillin) 2)  ! Vibramycin (Doxycycline Hyclate) 3)  ! Doxycycline Hyclate (Doxycycline Hyclate) 4)  ! Tetracycline Hcl (Tetracycline Hcl) 5)  ! Toprol Xl  Past History:  Past medical, surgical, family and social histories (including risk factors) reviewed, and no changes noted (except as noted below).  Past Medical History: ALLERGY (ICD-995.3) GERD (ICD-530.81) ESSENTIAL HYPERTENSION (ICD-401.9) HYPERCHOLESTEROLEMIA (ICD-272.0) ANXIETY DEPRESSION (ICD-300.4)    Past Surgical History: Reviewed history from 03/15/2008 and no changes required. NSVD X 2 (BREECH, MACRO) HYSTERECTOMY OVARIES INTACT   ENDOMETRIOSIS :(1983) ABD. ULTRASOUND -- NORMAL:(02/2000) EGD HIATAL HERNIA , ESOPH. :(03/2000) SEPTOPLASTY WITHWINDOWS(?) DR. CROSSLEY:(10/2000) HEMATURIA- FH  TRANSITIONAL  CELL CA OF KIDNEY ? WORK UP NEG (DR. SCOTT DONALDSON )(03/27/2001)  Family History: Reviewed history from 11/20/2009 and no changes required. Father: dec 62  ANEURYSM ? IN THROAT; DM; + MI  Mother: A 69 :(1974) KIDNEY and  LYMPHOMA CA/ NEPRECTOMY TUMOR OF LUNG Now smoking BROTHER A 62  MI AT 50YOA ETOH Smoke again BROTHER A 47 RENAL CANCER AT 38 YOA REMOVED SISTER A 61 ELEVATED CHOLESTEROL;   DM SMOKER CV: + GM MI// + FATHER MI M AUNTS MI BROTHER MILD MI HBP: + BROTHERS X 2, THROUGHOUT FAMILY DM: + SISTER// + FATHER GOUT/ARTHRITIS: PROSTATE CANCER: MOTHER AND BROTHER RENAL CELL , CANCER  //LYMPHOMA BREAST/OVARIAN/UTERINE CANCER:  COLON CANCER: DEPRESSION: + SELF ETOH/DRUG ABUSE: NEGATIVE OTHER : NEGATIVE STROKE  Social History: Reviewed history from 11/15/2008 and no changes required. Marital Status: DIVORCED Daughter lives w/ her  Children: 2 CHILDREN //1 DAUGHTER LIVES WITH MOTHER/SON LIVES WITH FATHER Occupation: [employer]  Review of Systems      See HPI General:  See HPI; Complains of fatigue and sleep disorder. ENT:  See HPI. GI:  Denies nausea and vomiting.  Physical Exam  Additional Exam:  Gen: WDWN, NAD; alert,appropriate and cooperative throughout exam  HEENT: Normocephalic and atraumatic. Throat clear, w/o  exudate, no LAD, R TM clear, L TM - good landmarks, No fluid present. rhinnorhea.  Left frontal and maxillary sinuses: nonender Right frontal and maxillary sinuses: Tender, R max  Neck: No ant or post LAD  CV: RRR, No M/G/R  Pulm: Breathing comfortably in no resp distress. no w/c/r  Abd: S,NT,ND,+BS  Extr: no c/c/e  Psych: full affect, pleasant    Impression & Recommendations:  Problem # 1:  SINUSITIS - ACUTE-NOS (ICD-461.9) Assessment New  Her updated medication list for  this problem includes:    Guaifenesin 400 Mg Tabs (Guaifenesin) .Marland Kitchen... As needed    Azithromycin 250 Mg Tabs (Azithromycin) .Marland Kitchen... 2 by  mouth today and then 1 daily for 4 days    Fluticasone Propionate 50 Mcg/act Susp (Fluticasone propionate) .Marland Kitchen... 2 sprays each nostril once daily  Instructed on treatment. Call if symptoms persist or worsen.   Problem # 2:  ALLERGIC RHINITIS (ICD-477.9) Assessment: Deteriorated  Her updated medication list for this problem includes:    Fluticasone Propionate 50 Mcg/act Susp (Fluticasone propionate) .Marland Kitchen... 2 sprays each nostril once daily  Problem # 3:  SLEEP APNEA (ICD-780.57) Concern for OSA given history as above, ? stopping breathing with sleeping, bad snoring, waking up multiple time in night  consult sleep medicine  Complete Medication List: 1)  Lipitor 10 Mg Tabs (Atorvastatin calcium) .Marland Kitchen.. 1 by mouth at bedtime 2)  Prilosec 40 Mg Cpdr (Omeprazole) .... 45 mins prior to brkfst daily by mouth 3)  Menest 0.625 Mg Tabs (Esterified estrogens) .... One tab by mouth daily 4)  Amlodipine Besylate 10 Mg Tabs (Amlodipine besylate) .... One tab by mouth at night 5)  Prozac 20 Mg Caps (Fluoxetine hcl) .Marland Kitchen.. 1 daily by mouth 6)  Maxzide-25 37.5-25 Mg Tabs (Triamterene-hctz) .... Take one by mouth daily 7)  Guaifenesin 400 Mg Tabs (Guaifenesin) .... As needed 8)  Azithromycin 250 Mg Tabs (Azithromycin) .... 2 by  mouth today and then 1 daily for 4 days 9)  Fluticasone Propionate 50 Mcg/act Susp (Fluticasone propionate) .... 2 sprays each nostril once daily  Other Orders: Sleep Disorder Referral (Sleep Disorder)  Patient Instructions: 1)  Referral Appointment Information 2)  Day/Date: 3)  Time: 4)  Place/MD: 5)  Address: 6)  Phone/Fax: 7)  Patient given appointment information. Information/Orders faxed/mailed.  Prescriptions: FLUTICASONE PROPIONATE 50 MCG/ACT  SUSP (FLUTICASONE PROPIONATE) 2 sprays each nostril once daily  #1 vial x 3   Entered and  Authorized by:   Hannah Beat MD   Signed by:   Hannah Beat MD on 03/01/2010   Method used:   Electronically to        [facility] [address] (retail)       [address], [address]       [place], [place]  [address]       Ph: [phone]       Fax: [phone]   RxID:   [id number] AZITHROMYCIN 250 MG  TABS (AZITHROMYCIN) 2 by  mouth today and then 1 daily for 4 days  #6 x 0   Entered and Authorized by:   Hannah Beat MD   Signed by:   Hannah Beat MD on 03/01/2010   Method used:   Electronically to        [facility] [address] (retail)       [address], [address]       [place]  [place], [place]  [address]       Ph: [phone]       Fax: [phone]   RxID:   [id number]   Current Allergies (reviewed today): ! AMPICILLIN (AMPICILLIN) ! VIBRAMYCIN (DOXYCYCLINE HYCLATE) ! DOXYCYCLINE HYCLATE (DOXYCYCLINE HYCLATE) ! TETRACYCLINE HCL (TETRACYCLINE HCL) ! TOPROL XL

## 2010-09-25 NOTE — Assessment & Plan Note (Signed)
Summary: rov to review sleep study results.   Visit Type:  Follow-up Copy to:  Karleen Hampshire Copland Primary Wynn Alldredge/Referring Siren Porrata:  Karleen Hampshire Copland  CC:  Jennifer Pineda here to discuss sleep study results. .  History of Present Illness: the Jennifer Pineda comes in today for f/u of her recent sleep study.  She was found to have an AHI of 42/hr with desat as low as 73%.  I have gone over the study with her in detail, and answered all of her questions.    Current Medications (verified): 1)  Lipitor 10 Mg  Tabs (Atorvastatin Calcium) .Marland Kitchen.. 1 By Mouth At Bedtime 2)  Prilosec 40 Mg  Cpdr (Omeprazole) .... 45 Mins Prior To Brkfst Daily By Mouth 3)  Menest 0.625 Mg  Tabs (Esterified Estrogens) .... One Tab By Mouth Daily 4)  Amlodipine Besylate 10 Mg Tabs (Amlodipine Besylate) .... One Tab By Mouth At Night 5)  Prozac 20 Mg  Caps (Fluoxetine Hcl) .Marland Kitchen.. 1 Daily By Mouth 6)  Maxzide-25 37.5-25 Mg  Tabs (Triamterene-Hctz) .... Take One By Mouth Daily 7)  Guaifenesin 400 Mg Tabs (Guaifenesin) .... As Needed 8)  Fluticasone Propionate 50 Mcg/act  Susp (Fluticasone Propionate) .... 2 Sprays Each Nostril Once Daily  Allergies (verified): 1)  ! Ampicillin (Ampicillin) 2)  ! Vibramycin (Doxycycline Hyclate) 3)  ! Doxycycline Hyclate (Doxycycline Hyclate) 4)  ! Tetracycline Hcl (Tetracycline Hcl) 5)  ! Toprol Xl  Past History:  Past medical, surgical, family and social histories (including risk factors) reviewed, and no changes noted (except as noted below).  Past Medical History: Reviewed history from 03/01/2010 and no changes required. ALLERGY (ICD-995.3) GERD (ICD-530.81) ESSENTIAL HYPERTENSION (ICD-401.9) HYPERCHOLESTEROLEMIA (ICD-272.0) ANXIETY DEPRESSION (ICD-300.4)    Past Surgical History: Reviewed history from 04/05/2010 and no changes required. NSVD X 2 ([facility], MACRO) HYSTERECTOMY OVARIES INTACT  ENDOMETRIOSIS :(1983) ABD. ULTRASOUND -- NORMAL:(02/2000) EGD HIATAL HERNIA , ESOPH.  :(03/2000) SEPTOPLASTY WITHWINDOWS(?) DR. CROSSLEY:(10/2000) HEMATURIA- FH  TRANSITIONAL  CELL CA OF KIDNEY ? WORK UP NEG (DR. SCOTT DONALDSON )(03/27/2001) Appendectomy 1983  Family History: Reviewed history from 04/05/2010 and no changes required. Father: dec 62  ANEURYSM ? IN THROAT; DM; + MI  Mother: A 56 :(1974) KIDNEY and  LYMPHOMA CA/ NEPRECTOMY TUMOR OF LUNG Now smoking, deceased, T-cell cancer in kidney BROTHER A 64  MI AT 50YOA ETOH Smoke again BROTHER A 49 T-Cell RENAL CANCER AT 38 YOA REMOVED SISTER A 61 ELEVATED CHOLESTEROL;   DM SMOKER CV: + GM MI// + FATHER MI M AUNTS MI BROTHER MILD MI HBP: + BROTHERS X 2, THROUGHOUT FAMILY DM: + SISTER// + FATHER GOUT/ARTHRITIS: PROSTATE CANCER: MOTHER AND BROTHER RENAL CELL , CANCER  //LYMPHOMA BREAST/OVARIAN/UTERINE CANCER:  COLON CANCER: DEPRESSION: + SELF ETOH/DRUG ABUSE: NEGATIVE OTHER : NEGATIVE STROKE  Social History: Reviewed history from 04/05/2010 and no changes required. Marital Status: DIVORCED and now single. 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  started at age 31.  2 ppd.  quit 1988.   Review of Systems       The patient complains of productive cough, nasal congestion/difficulty breathing through nose, sneezing, and joint stiffness or pain.  The patient denies shortness of breath with activity, shortness of breath at rest, non-productive cough, coughing up blood, chest pain, irregular heartbeats, acid heartburn, indigestion, loss of appetite, weight change, abdominal pain, difficulty swallowing, sore throat, tooth/dental problems, headaches, itching, ear ache,  anxiety, depression, hand/feet swelling, rash, change in color of mucus, and fever.    Vital Signs:  Patient profile:   60 year old female Height:      64 inches Weight:      182.38 pounds BMI:     31.42 O2 Sat:       99 % on Room air Temp:     97.8 degrees F oral Pulse rate:   63 / minute BP sitting:   142 / 70  (left arm) Cuff size:   regular  Vitals Entered By: Carver Fila (June 12, 2010 11:18 AM)  O2 Flow:  Room air CC: Jennifer Pineda here to discuss sleep study results.  Comments meds and allergies updated Phone number updated Carver Fila  June 12, 2010 11:18 AM    Physical Exam  General:  ow female in nad Nose:  no discharge or purulence. Extremities:  no edema or cyanosis  Neurologic:  alert, does not appear sleepy, moves all 4.   Impression & Recommendations:  Problem # 1:  OBSTRUCTIVE SLEEP APNEA (ICD-327.23) the Jennifer Pineda has severe osa by her recent sleep study, and will require cpap and weight loss if we are to treat this aggressively.  She is agreeable to this.   I will set the patient up on cpap at a moderate pressure level to allow for desensitization, and will troubleshoot the device over the next 4-6weeks if needed.  The Jennifer Pineda is to call me if having issues with tolerance.  Will then optimize the pressure once patient is able to wear cpap on a consistent basis.  Other Orders: DME Referral (DME) Est. Patient Level III ([address])  Patient Instructions: 1)  will start on cpap with moderate pressure level...please call if having tolerance issues. 2)  work on weight loss 3)  followup with me in 4-5 weeks.

## 2010-09-25 NOTE — Progress Notes (Signed)
Summary: need to change abx  Phone Note From Pharmacy   Caller: [facility] Summary of Call: Generic clarithromycin is not available right now and pt has no insurance so she cant afford the brand name.  Pharmacy is asking if that can be changed to something else. Initial call taken by: Lowella Petties CMA, AAMA,  August 02, 2010 3:19 PM  Follow-up for Phone Call        Use erythromyucin 333mg  three times a day for two weeks. Follow-up by: Shaune Leeks MD,  August 02, 2010 3:47 PM  Additional Follow-up for Phone Call Additional follow up Details #1::        Medicine called to pharmacy, changed in emr. Additional Follow-up by: Lowella Petties CMA, AAMA,  August 02, 2010 5:21 PM    New/Updated Medications: ERY-TAB 333 MG TBEC (ERYTHROMYCIN BASE) take one by mouth three times a day for 2 weeks  Prior Medications: LIPITOR 10 MG  TABS (ATORVASTATIN CALCIUM) 1 by mouth at bedtime PRILOSEC 40 MG  CPDR (OMEPRAZOLE) 45 mins prior to brkfst daily by mouth MENEST 0.625 MG  TABS (ESTERIFIED ESTROGENS) one tab by mouth daily AMLODIPINE BESYLATE 10 MG TABS (AMLODIPINE BESYLATE) one tab by mouth at night PROZAC 20 MG  CAPS (FLUOXETINE HCL) 1 daily by mouth MAXZIDE-25 37.5-25 MG  TABS (TRIAMTERENE-HCTZ) take one by mouth daily GUAIFENESIN 400 MG TABS (GUAIFENESIN) as needed FLUTICASONE PROPIONATE 50 MCG/ACT  SUSP (FLUTICASONE PROPIONATE) 2 sprays each nostril once daily Current Allergies: ! AMPICILLIN (AMPICILLIN) ! VIBRAMYCIN (DOXYCYCLINE HYCLATE) ! DOXYCYCLINE HYCLATE (DOXYCYCLINE HYCLATE) ! TETRACYCLINE HCL (TETRACYCLINE HCL) ! TOPROL XL

## 2010-09-25 NOTE — Assessment & Plan Note (Signed)
Summary: sinus infection/alc   Vital Signs:  Patient profile:   60 year old female Weight:      183.4 pounds Temp:     99.1 degrees F oral Pulse rate:   60 / minute Pulse rhythm:   regular BP sitting:   128 / 70  (left arm) Cuff size:   regular  Vitals Entered By: Sydell Axon LPN (August 02, 2010 9:04 AM) CC: Head and face hurts and nose is bleeding, ? sinus infection   History of Present Illness: Pt here for maxillary pain on the right and mild generalized headache. She has been warm and chilled but has not taken her temperature. She also has mild cough, minimally productive, some sneezing but no itchy eyes. She has been taking Advil and Guaifenesin.  Problems Prior to Update: 1)  Obstructive Sleep Apnea  (ICD-327.23) 2)  Allergic Rhinitis  (ICD-477.9) 3)  Sinusitis - Acute-nos  (ICD-461.9) 4)  Sleep Apnea  (ICD-780.57) 5)  Mole of Skin of Back  (ICD-216.9) 6)  Allergy  (ICD-995.3) 7)  Special Screening Malig Neoplasms Other Sites  (ICD-V76.49) 8)  Health Maintenance Exam  (ICD-V70.0) 9)  Other Screening Mammogram  (ICD-V76.12) 10)  Neck Pain w/ Ue Numbness  (ICD-723.1) 11)  Hematuria, Hx of  (ICD-V13.09) 12)  Duodenitis  (ICD-535.60) 13)  Gastritis  (ICD-535.50) 14)  Gerd  (ICD-530.81) 15)  Essential Hypertension  (ICD-401.9) 16)  Hypercholesterolemia  (ICD-272.0) 17)  Anxiety Depression  (ICD-300.4) 18)  Dermatitis  (ICD-692.9) 19)  Herpes Zoster, Uncomplicated  (ICD-053.9)  Medications Prior to Update: 1)  Lipitor 10 Mg  Tabs (Atorvastatin Calcium) .Marland Kitchen.. 1 By Mouth At Bedtime 2)  Prilosec 40 Mg  Cpdr (Omeprazole) .... 45 Mins Prior To Brkfst Daily By Mouth 3)  Menest 0.625 Mg  Tabs (Esterified Estrogens) .... One Tab By Mouth Daily 4)  Amlodipine Besylate 10 Mg Tabs (Amlodipine Besylate) .... One Tab By Mouth At Night 5)  Prozac 20 Mg  Caps (Fluoxetine Hcl) .Marland Kitchen.. 1 Daily By Mouth 6)  Maxzide-25 37.5-25 Mg  Tabs (Triamterene-Hctz) .... Take One By Mouth Daily 7)   Guaifenesin 400 Mg Tabs (Guaifenesin) .... As Needed 8)  Fluticasone Propionate 50 Mcg/act  Susp (Fluticasone Propionate) .... 2 Sprays Each Nostril Once Daily  Current Medications (verified): 1)  Lipitor 10 Mg  Tabs (Atorvastatin Calcium) .Marland Kitchen.. 1 By Mouth At Bedtime 2)  Prilosec 40 Mg  Cpdr (Omeprazole) .... 45 Mins Prior To Brkfst Daily By Mouth 3)  Menest 0.625 Mg  Tabs (Esterified Estrogens) .... One Tab By Mouth Daily 4)  Amlodipine Besylate 10 Mg Tabs (Amlodipine Besylate) .... One Tab By Mouth At Night 5)  Prozac 20 Mg  Caps (Fluoxetine Hcl) .Marland Kitchen.. 1 Daily By Mouth 6)  Maxzide-25 37.5-25 Mg  Tabs (Triamterene-Hctz) .... Take One By Mouth Daily 7)  Guaifenesin 400 Mg Tabs (Guaifenesin) .... As Needed 8)  Fluticasone Propionate 50 Mcg/act  Susp (Fluticasone Propionate) .... 2 Sprays Each Nostril Once Daily 9)  Clarithromycin 500 Mg Tabs (Clarithromycin) .... One Tab By Mouth Two Times A Day  Allergies: 1)  ! Ampicillin (Ampicillin) 2)  ! Vibramycin (Doxycycline Hyclate) 3)  ! Doxycycline Hyclate (Doxycycline Hyclate) 4)  ! Tetracycline Hcl (Tetracycline Hcl) 5)  ! Toprol Xl  Physical Exam  General:  alert, well-developed, well-nourished, and well-hydrated.  Mildly congested. Head:  Normocephalic and atraumatic without obvious abnormalities. No apparent alopecia or balding. Sinuses tender in max distrib, R>L. Eyes:  Conjunctiva clear bilaterally except mild palpebral erythema.Marland Kitchen  Ears:  External ear exam shows no significant lesions or deformities.  Otoscopic examination reveals clear canals, tympanic membranes are intact bilaterally without bulging, retraction, inflammation or discharge. Hearing is grossly normal bilaterally. Nose:  External nasal examination shows no deformity or inflammation. Nasal mucosa are pink and moist without lesions or exudates. Mouth:  Oral mucosa and oropharynx without lesions or exudates.  Teeth in good repair. Neck:  No deformities, masses, or tenderness  noted. Chest Wall:  No deformities, masses, or tenderness noted. Lungs:  Normal respiratory effort, chest expands symmetrically. Lungs are clear to auscultation, no crackles or wheezes. Heart:  Normal rate and regular rhythm. S1 and S2 normal without gallop, murmur, click, rub or other extra sounds.   Impression & Recommendations:  Problem # 1:  SINUSITIS - ACUTE-NOS (ICD-461.9) Assessment [address] [place], cont Guaif, Advil with food and nasal inh. Her updated medication list for this problem includes:    Guaifenesin 400 Mg Tabs (Guaifenesin) .Marland Kitchen... As needed    Fluticasone Propionate 50 Mcg/act Susp (Fluticasone propionate) .Marland Kitchen... 2 sprays each nostril once daily    Clarithromycin 500 Mg Tabs (Clarithromycin) ..... One tab by mouth two times a day  Instructed on treatment. Call if symptoms persist or worsen.   Complete Medication List: 1)  Lipitor 10 Mg Tabs (Atorvastatin calcium) .Marland Kitchen.. 1 by mouth at bedtime 2)  Prilosec 40 Mg Cpdr (Omeprazole) .... 45 mins prior to brkfst daily by mouth 3)  Menest 0.625 Mg Tabs (Esterified estrogens) .... One tab by mouth daily 4)  Amlodipine Besylate 10 Mg Tabs (Amlodipine besylate) .... One tab by mouth at night 5)  Prozac 20 Mg Caps (Fluoxetine hcl) .Marland Kitchen.. 1 daily by mouth 6)  Maxzide-25 37.5-25 Mg Tabs (Triamterene-hctz) .... Take one by mouth daily 7)  Guaifenesin 400 Mg Tabs (Guaifenesin) .... As needed 8)  Fluticasone Propionate 50 Mcg/act Susp (Fluticasone propionate) .... 2 sprays each nostril once daily 9)  Clarithromycin 500 Mg Tabs (Clarithromycin) .... One tab by mouth two times a day  Patient Instructions: 1)  RTC if sxs don't resolve. Prescriptions: CLARITHROMYCIN 500 MG TABS (CLARITHROMYCIN) one tab by mouth two times a day  #28 x 0   Entered and Authorized by:   Shaune Leeks MD   Signed by:   Shaune Leeks MD on 08/02/2010   Method used:   Electronically to        [facility] (retail)       [address], [address]       [place], [place]  [address]       Ph: [phone]       Fax: [phone]   RxID:   [id number]    Orders Added: 1)  Est. Patient Level III [[address]]    Current Allergies (reviewed today): ! AMPICILLIN (AMPICILLIN) ! VIBRAMYCIN (DOXYCYCLINE HYCLATE) ! DOXYCYCLINE HYCLATE (DOXYCYCLINE HYCLATE) ! TETRACYCLINE HCL (TETRACYCLINE HCL) ! TOPROL XL

## 2010-09-27 NOTE — Assessment & Plan Note (Signed)
Summary: ?SINUS INFECTION/CLE   Vital Signs:  Patient profile:   60 year old female Weight:      191.16 pounds Temp:     76 degrees F oral Pulse rate:   76 / minute Pulse rhythm:   regular BP sitting:   138 / 60  (left arm) Cuff size:   large  Vitals Entered By: Sydell Axon LPN (September 20, 2010 2:41 PM) CC: ? sinus infection, sneezing, stuffy nose, sinus pressure and drainage   History of Present Illness: Pt here again for facial congestion. She has had bloody discharge from the nose. She denies fever or chills, some headache in the paranasal area. She has no ear pain, significant clear runny nose, blowing the nose delivers bloody yellow/green mucous. She has ST and PND, minimal cough. There is no N/V or diarrhea. She has taken guaif and used Neti Pot and Aleve and nose spray ( Fluticasone) She has had septoplasty with antral windows done 10 years ago.  Problems Prior to Update: 1)  Sinusitis - Acute-nos  (ICD-461.9) 2)  Obstructive Sleep Apnea  (ICD-327.23) 3)  Allergic Rhinitis  (ICD-477.9) 4)  Mole of Skin of Back  (ICD-216.9) 5)  Allergy  (ICD-995.3) 6)  Special Screening Malig Neoplasms Other Sites  (ICD-V76.49) 7)  Health Maintenance Exam  (ICD-V70.0) 8)  Other Screening Mammogram  (ICD-V76.12) 9)  Neck Pain w/ Ue Numbness  (ICD-723.1) 10)  Hematuria, Hx of  (ICD-V13.09) 11)  Duodenitis  (ICD-535.60) 12)  Gastritis  (ICD-535.50) 13)  Gerd  (ICD-530.81) 14)  Essential Hypertension  (ICD-401.9) 15)  Hypercholesterolemia  (ICD-272.0) 16)  Anxiety Depression  (ICD-300.4) 17)  Dermatitis  (ICD-692.9) 18)  Herpes Zoster, Uncomplicated  (ICD-053.9)  Medications Prior to Update: 1)  Lipitor 10 Mg  Tabs (Atorvastatin Calcium) .Marland Kitchen.. 1 By Mouth At Bedtime 2)  Prilosec 40 Mg  Cpdr (Omeprazole) .... 45 Mins Prior To Brkfst Daily By Mouth 3)  Menest 0.625 Mg  Tabs (Esterified Estrogens) .... One Tab By Mouth Daily 4)  Amlodipine Besylate 10 Mg Tabs (Amlodipine Besylate) .... One  Tab By Mouth At Night 5)  Prozac 20 Mg  Caps (Fluoxetine Hcl) .Marland Kitchen.. 1 Daily By Mouth 6)  Maxzide-25 37.5-25 Mg  Tabs (Triamterene-Hctz) .... Take One By Mouth Daily 7)  Guaifenesin 400 Mg Tabs (Guaifenesin) .... As Needed 8)  Fluticasone Propionate 50 Mcg/act  Susp (Fluticasone Propionate) .... 2 Sprays Each Nostril Once Daily 9)  Ery-Tab 333 Mg Tbec (Erythromycin Base) .... Take One By Mouth Three Times A Day For 2 Weeks  Allergies: 1)  ! Ampicillin (Ampicillin) 2)  ! Vibramycin (Doxycycline Hyclate) 3)  ! Doxycycline Hyclate (Doxycycline Hyclate) 4)  ! Tetracycline Hcl (Tetracycline Hcl) 5)  ! Toprol Xl  Physical Exam  General:  alert, well-developed, well-nourished, and well-hydrated.  Mildly congested. Head:  Normocephalic and atraumatic without obvious abnormalities. No apparent alopecia or balding. Sinuses tender in max distrib, bilaterally. Eyes:  Conjunctiva clear bilaterally except mild palpebral erythema..  Ears:  External ear exam shows no significant lesions or deformities.  Otoscopic examination reveals clear canals, tympanic membranes are intact bilaterally without bulging, retraction, inflammation or discharge. Hearing is grossly normal bilaterally. Nose:  External nasal examination shows no deformity or inflammation. Nasal mucosa are pink and moist without lesions or exudates. Mild congestion. Mouth:  Oral mucosa and oropharynx without lesions or exudates.  Teeth in good repair. Neck:  No deformities, masses, or tenderness noted. Lungs:  Normal respiratory effort, chest expands symmetrically. Lungs are clear  to auscultation, no crackles or wheezes. Heart:  Normal rate and regular rhythm. S1 and S2 normal without gallop, murmur, click, rub or other extra sounds.   Impression & Recommendations:  Problem # 1:  SINUSITIS - ACUTE-NOS (ICD-461.9) Assessment Deteriorated  Recurrent. Repeat E mycin for two weeks. Cont Guaif AM and Noon. Cont Aleve after brfst and supper.  Prednisone in AM for four days. See Allergy due to frequent recurrent sinus congestion and eventual infection. Medications were removed from the medication list:    Ery-tab 333 Mg Tbec (Erythromycin base) .Marland Kitchen... Take one by mouth three times a day for 2 weeks Her updated medication list for this problem includes:    Guaifenesin 400 Mg Tabs (Guaifenesin) .Marland Kitchen... As needed    Fluticasone Propionate 50 Mcg/act Susp (Fluticasone propionate) .Marland Kitchen... 2 sprays each nostril once daily    Erythromycin Base 333 Mg Tbec (Erythromycin base) ..... One tab by mouth three times a day  Orders: Allergy Referral  (Allergy)  Instructed on treatment. Call if symptoms persist or worsen.   Complete Medication List: 1)  Lipitor 10 Mg Tabs (Atorvastatin calcium) .Marland Kitchen.. 1 by mouth at bedtime 2)  Prilosec 40 Mg Cpdr (Omeprazole) .... 45 mins prior to brkfst daily by mouth 3)  Menest 0.625 Mg Tabs (Esterified estrogens) .... One tab by mouth daily 4)  Amlodipine Besylate 10 Mg Tabs (Amlodipine besylate) .... One tab by mouth at night 5)  Prozac 20 Mg Caps (Fluoxetine hcl) .Marland Kitchen.. 1 daily by mouth 6)  Maxzide-25 37.5-25 Mg Tabs (Triamterene-hctz) .... Take one by mouth daily 7)  Guaifenesin 400 Mg Tabs (Guaifenesin) .... As needed 8)  Fluticasone Propionate 50 Mcg/act Susp (Fluticasone propionate) .... 2 sprays each nostril once daily 9)  Erythromycin Base 333 Mg Tbec (Erythromycin base) .... One tab by mouth three times a day 10)  Prednisone 50 Mg Tabs (Prednisone) .... One tab by mouth in am  Patient Instructions: 1)  Refer to allergist. Prescriptions: PREDNISONE 50 MG TABS (PREDNISONE) one tab by mouth in AM  #4 x 0   Entered and Authorized by:   Shaune Leeks MD   Signed by:   Shaune Leeks MD on 09/20/2010   Method used:   Electronically to        [facility] (retail)       [address], [address]       [place], [place]  [address]       Ph: [phone]        Fax: [phone]   RxID:   [id number] ERYTHROMYCIN BASE 333 MG TBEC (ERYTHROMYCIN BASE) one tab by mouth three times a day  #42 x 0   Entered and Authorized by:   Shaune Leeks MD   Signed by:   Shaune Leeks MD on 09/20/2010   Method used:   Electronically to        [facility] (retail)       [address], [address]       [place], [place]  [address]       Ph: [phone]       Fax: [phone]   RxID:   [id number]    Orders Added: 1)  Allergy Referral  [Allergy] 2)  Est. Patient Level III [[address]]    Current Allergies (reviewed today): ! AMPICILLIN (AMPICILLIN) ! VIBRAMYCIN (DOXYCYCLINE HYCLATE) ! DOXYCYCLINE HYCLATE (  DOXYCYCLINE HYCLATE) ! TETRACYCLINE HCL (TETRACYCLINE HCL) ! TOPROL XL  Appended Document: ?SINUS INFECTION/CLE    Clinical Lists Changes  Medications: Rx of FLUTICASONE PROPIONATE 50 MCG/ACT  SUSP (FLUTICASONE PROPIONATE) 2 sprays each nostril once daily;  #1 vial x 3;  Signed;  Entered by: Shaune Leeks MD;  Authorized by: Shaune Leeks MD;  Method used: Electronically to [facility], [place], [place], [place]  [address], Ph: [phone], Fax: [phone]    Prescriptions: FLUTICASONE PROPIONATE 50 MCG/ACT  SUSP (FLUTICASONE PROPIONATE) 2 sprays each nostril once daily  #1 vial x 3   Entered and Authorized by:   Shaune Leeks MD   Signed by:   Shaune Leeks MD on 09/20/2010   Method used:   Electronically to        [facility] (retail)       [address], [address]       [place], [place]  [address]       Ph: [phone]       Fax: [phone]   RxID:   [id number]

## 2010-10-03 ENCOUNTER — Encounter: Payer: Self-pay | Admitting: Family Medicine

## 2010-10-03 ENCOUNTER — Emergency Department: Payer: Self-pay | Admitting: Emergency Medicine

## 2010-10-03 ENCOUNTER — Ambulatory Visit: Payer: Self-pay | Admitting: Family Medicine

## 2010-10-03 DIAGNOSIS — J019 Acute sinusitis, unspecified: Secondary | ICD-10-CM

## 2010-10-03 DIAGNOSIS — R0602 Shortness of breath: Secondary | ICD-10-CM

## 2010-10-03 DIAGNOSIS — J209 Acute bronchitis, unspecified: Secondary | ICD-10-CM

## 2010-10-03 DIAGNOSIS — J159 Unspecified bacterial pneumonia: Secondary | ICD-10-CM

## 2010-10-04 ENCOUNTER — Encounter: Payer: Self-pay | Admitting: Family Medicine

## 2010-10-09 ENCOUNTER — Encounter: Payer: Self-pay | Admitting: Family Medicine

## 2010-10-11 NOTE — Assessment & Plan Note (Signed)
Summary: cough,chest congestion/jbb   Vital Signs:  Patient Profile:   60 Years Old Female CC:      Cold & URI symptoms Height:     64 inches (163.19 cm) Weight:      182 pounds BMI:     31.35 O2 Sat:      96 % O2 treatment:    Room Air Temp:     98.4 degrees F oral Pulse rate:   79 / minute Pulse rhythm:   regular Resp:     20 per minute BP sitting:   153 / 76  (right arm)  Pt. in pain?   no  Vitals Entered By: Levonne Spiller EMT-P (October 03, 2010 1:06 PM)              Is Patient Diabetic? No  Does patient need assistance? Functional Status Self care Ambulation Normal      Current Allergies: ! AMPICILLIN (AMPICILLIN) ! VIBRAMYCIN (DOXYCYCLINE HYCLATE) ! DOXYCYCLINE HYCLATE (DOXYCYCLINE HYCLATE) ! TETRACYCLINE HCL (TETRACYCLINE HCL) ! TOPROL XL Lab Results    Ordered by:  Dr. Maryln Manuel    Date tests performed: 10/03/2010    Performed by:  Levonne Spiller EMT-P    R. Flu (A & B): Neg  History of Present Illness Chief Complaint: Cold & URI symptoms  REVIEW OF SYSTEMS Constitutional Symptoms       Complains of fever, chills, and fatigue.     Denies night sweats, weight loss, and weight gain.  Eyes       Denies change in vision, eye pain, eye discharge, glasses, contact lenses, and eye surgery. Ear/Nose/Throat/Mouth       Complains of sinus problems, sore throat, and hoarseness.      Denies hearing loss/aids, change in hearing, ear pain, ear discharge, dizziness, frequent runny nose, frequent nose bleeds, and tooth pain or bleeding.  Respiratory       Complains of productive cough, wheezing, shortness of breath, and bronchitis.      Denies dry cough, asthma, and emphysema/COPD.      Comments: Yellow colored sputum Cardiovascular       Denies murmurs, chest pain, and tires easily with exhertion.    Gastrointestinal       Denies stomach pain, nausea/vomiting, diarrhea, constipation, blood in bowel movements, and indigestion. Genitourniary       Denies painful  urination, blood or discharge from vagina, kidney stones, and loss of urinary control. Neurological       Denies paralysis, seizures, and fainting/blackouts. Musculoskeletal       Denies muscle pain, joint pain, joint stiffness, decreased range of motion, redness, swelling, muscle weakness, and gout.  Skin       Denies bruising, unusual mles/lumps or sores, and hair/skin or nail changes.  Psych       Denies mood changes, temper/anger issues, anxiety/stress, speech problems, depression, and sleep problems.  Past History:  Family History: Last updated: 04/05/2010 Father: dec 62  ANEURYSM ? IN THROAT; DM; + MI  Mother: A 28 :(1974) KIDNEY and  LYMPHOMA CA/ NEPRECTOMY TUMOR OF LUNG Now smoking, deceased, T-cell cancer in kidney BROTHER A 64  MI AT 50YOA ETOH Smoke again BROTHER A 49 T-Cell RENAL CANCER AT 38 YOA REMOVED SISTER A 61 ELEVATED CHOLESTEROL;   DM SMOKER CV: + GM MI// + FATHER MI M AUNTS MI BROTHER MILD MI HBP: + BROTHERS X 2, THROUGHOUT FAMILY DM: + SISTER// + FATHER GOUT/ARTHRITIS: PROSTATE CANCER: MOTHER AND BROTHER RENAL CELL , CANCER  //  LYMPHOMA BREAST/OVARIAN/UTERINE CANCER:  COLON CANCER: DEPRESSION: + SELF ETOH/DRUG ABUSE: NEGATIVE OTHER : NEGATIVE STROKE  Social History: Last updated: 04/05/2010 Marital Status: DIVORCED and now single. 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  started at age 13.  2 ppd.  quit 1988.   Risk Factors: Alcohol Use: <1 (11/20/2009) Caffeine Use: 2 (11/20/2009) Exercise: no (11/20/2009)  Risk Factors: Smoking Status: quit (11/20/2009) Packs/Day: 11/2 (11/20/2009)  Past Medical History: Reviewed history from 03/01/2010 and no changes required. ALLERGY (ICD-995.3) GERD (ICD-530.81) ESSENTIAL HYPERTENSION (ICD-401.9) HYPERCHOLESTEROLEMIA (ICD-272.0) ANXIETY DEPRESSION (ICD-300.4)     Past Surgical History: Reviewed history from 04/05/2010 and no changes required. NSVD X 2 ([facility], MACRO) HYSTERECTOMY OVARIES INTACT  ENDOMETRIOSIS :(1983) ABD. ULTRASOUND -- NORMAL:(02/2000) EGD HIATAL HERNIA , ESOPH. :(03/2000) SEPTOPLASTY WITHWINDOWS(?) DR. CROSSLEY:(10/2000) HEMATURIA- FH  TRANSITIONAL  CELL CA OF KIDNEY ? WORK UP NEG (DR. SCOTT DONALDSON )(03/27/2001) Appendectomy 1983  Family History: Reviewed history from 04/05/2010 and no changes required. Father: dec 62  ANEURYSM ? IN THROAT; DM; + MI  Mother: A 31 :(1974) KIDNEY and  LYMPHOMA CA/ NEPRECTOMY TUMOR OF LUNG Now smoking, deceased, T-cell cancer in kidney BROTHER A 64  MI AT 50YOA ETOH Smoke again BROTHER A 49 T-Cell RENAL CANCER AT 38 YOA REMOVED SISTER A 61 ELEVATED CHOLESTEROL;   DM SMOKER CV: + GM MI// + FATHER MI M AUNTS MI BROTHER MILD MI HBP: + BROTHERS X 2, THROUGHOUT FAMILY DM: + SISTER// + FATHER GOUT/ARTHRITIS: PROSTATE CANCER: MOTHER AND BROTHER RENAL CELL , CANCER  //LYMPHOMA BREAST/OVARIAN/UTERINE CANCER:  COLON CANCER: DEPRESSION: + SELF ETOH/DRUG ABUSE: NEGATIVE OTHER : NEGATIVE STROKE  Social History: Reviewed history from 04/05/2010 and no changes required. Marital Status: DIVORCED and now single. 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  started at age 65.  2 ppd.  quit 1988.  Physical Exam General appearance: well developed, well nourished, mild distress, loud deep chest cough Head: normocephalic, atraumatic Eyes: conjunctivae and lids normal Pupils: equal, round, reactive to light Ears: normal, no lesions or deformities Nasal: swollen red turbinates with congestion Oral/Pharynx: tongue normal, posterior pharynx with erythema, no exudate Neck: neck supple,  trachea midline, no masses Chest/Lungs: inspiratory/expiratory wheezing and anterior  rales heard, ant upper airway congestion noises Heart: regular rate and  rhythm, no murmur Abdomen: soft, non-tender without obvious organomegaly Extremities: normal extremities Neurological: grossly intact and non-focal Skin: no obvious rashes or lesions MSE: oriented to time, place, and person Assessment Problems:   SINUSITIS - ACUTE-NOS (ICD-461.9) OBSTRUCTIVE SLEEP APNEA (ICD-327.23) ALLERGIC RHINITIS (ICD-477.9) MOLE OF SKIN OF BACK (ICD-216.9) ALLERGY (ICD-995.3) SPECIAL SCREENING MALIG NEOPLASMS OTHER SITES (ICD-V76.49) HEALTH MAINTENANCE EXAM (ICD-V70.0) OTHER SCREENING MAMMOGRAM (ICD-V76.12) NECK PAIN W/ UE NUMBNESS (ICD-723.1) HEMATURIA, HX OF (ICD-V13.09) DUODENITIS (ICD-535.60) GASTRITIS (ICD-535.50) GERD (ICD-530.81) ESSENTIAL HYPERTENSION (ICD-401.9) HYPERCHOLESTEROLEMIA (ICD-272.0) ANXIETY DEPRESSION (ICD-300.4) DERMATITIS (ICD-692.9) HERPES ZOSTER, UNCOMPLICATED (ICD-053.9) New Problems: ? of BACTERIAL PNEUMONIA (ICD-482.9) SHORTNESS OF BREATH (ICD-786.05) ACUTE SINUSITIS, UNSPECIFIED (ICD-461.9) ACUTE BRONCHITIS (ICD-466.0)   Patient Education: Patient and/or caregiver instructed in the following: rest, fluids. The risks, benefits and possible side effects were clearly explained and discussed with the patient.  The patient verbalized clear understanding.  The patient was given instructions to return if symptoms don't improve, worsen or new changes develop.  If it is not during clinic hours and the patient cannot get back to this clinic then the patient was told to seek medical care at an available urgent care or emergency department.  The patient verbalized understanding.   Demonstrates willingness to comply.  Plan New Medications/Changes: LORATADINE 10 MG TABS (LORATADINE) take 1 by mouth daily  #30 x 0, 10/03/2010, Clanford Johnson MD VENTOLIN HFA 108 (90 BASE) MCG/ACT AERS (ALBUTEROL SULFATE) 2 puffs inhaled every 3 hours as needed for cough, wheezing,  congestion  #1 x 1, 10/03/2010, Clanford Johnson MD PREDNISONE 20 MG TABS (PREDNISONE) take 3 tabs by mouth daily x 3 days then 2 tabs by mouth daily x 4 days  #17 x 0, 10/03/2010, Clanford Johnson MD LEVOFLOXACIN 500 MG TABS (LEVOFLOXACIN) take 1 by mouth daily until completed  #10 x 0, 10/03/2010, Clanford Johnson MD  Planning Comments:   [facility] to [facility] to get Chest XRAY PA/LAT  Follow Up: Follow up in 1-2 days if no improvement, Follow up on an as needed basis, Follow up with Primary Physician  The patient and/or caregiver has been counseled thoroughly with regard to medications prescribed including dosage, schedule, interactions, rationale for use, and possible side effects and they verbalize understanding.  Diagnoses and expected course of recovery discussed and will return if not improved as expected or if the condition worsens. Patient and/or caregiver verbalized understanding.  Prescriptions: LORATADINE 10 MG TABS (LORATADINE) take 1 by mouth daily  #30 x 0   Entered and Authorized by:   Standley Dakins MD   Signed by:   Standley Dakins MD on 10/03/2010   Method used:   Electronically to        [facility] [address] (retail)       [address], [address]       [place], [place]  [address]       Ph: [phone]       Fax: [phone]   RxID:   [id number] VENTOLIN HFA 108 (90 BASE) MCG/ACT AERS (ALBUTEROL SULFATE) 2 puffs inhaled every 3 hours as needed for cough, wheezing, congestion  #1 x 1   Entered and Authorized by:   Standley Dakins MD   Signed by:   Standley Dakins MD on 10/03/2010   Method used:   Electronically to        [facility] [address] (retail)       [address], [address]       [place], [place]  [address]       Ph: [phone]       Fax: [phone]   RxID:   [id number] PREDNISONE 20 MG TABS (PREDNISONE) take 3 tabs by mouth daily x 3 days then 2 tabs by mouth daily x 4 days   #17 x 0   Entered and Authorized by:   Standley Dakins MD   Signed by:   Standley Dakins MD on 10/03/2010   Method used:   Electronically to        [facility] [address] (retail)       [address], [address]       [place], [place]  [address]       Ph: [phone]       Fax: [phone]   RxID:   [id number] LEVOFLOXACIN 500 MG TABS (LEVOFLOXACIN) take 1  by mouth daily until completed  #10 x 0   Entered and Authorized by:   Standley Dakins MD   Signed by:   Standley Dakins MD on 10/03/2010   Method used:   Electronically to        [facility] [address] (retail)       [address], [address]       [place], [place]  [address]       Ph: [phone]       Fax: [phone]   RxID:   [id number]   Patient Instructions: 1)  Go to the pharmacy and pick up your prescription (s).  It may take up to 30 mins for electronic prescriptions to be delivered to the pharmacy.  Please call if your pharmacy has not received your prescriptions after 30 minutes.   2)  Go to get your xrays done.  Please call us at [phone] if you have not heard about your results in 3 days.   3)  Take your antibiotic as prescribed until ALL of it is gone, but stop if you develop a rash or swelling and contact our office as soon as possible. 4)  Acute sinusitis symptoms for less than 10 days are not helped by antibiotics.Use warm moist compresses, and over the counter decongestants ( only as directed). Call if no improvement in 5-7 days, sooner if increasing pain, fever, or new symptoms. 5)  Acute bronchitis symptoms for less than 10 days are not helped by antibiotics. take over the counter cough medications. call if no improvment in  5-7 days, sooner if increasing cough, fever, or new symptoms( shortness of breath, chest pain). 6)  Return or go to the ER if no improvement or symptoms getting worse.   7)  The patient was informed that there is no  on-call provider or services available at this clinic during off-hours (when the clinic is closed).  If the patient developed a problem or concern that required immediate attention, the patient was advised to go the the nearest available urgent care or emergency department for medical care.  The patient verbalized understanding.    8)  We will be calling you about your xray results soon, please call us if you have not heard about your results in 3 days.    Medication Administration  Injection # 1:    Medication: Solumedrol up to 125mg     Diagnosis: Resp. S.O.B.    Route: IM    Site: RUOQ gluteus    Exp Date: 02/22/2013    Lot #: [id number]    Patient tolerated injection without complications    Given by: Levonne Spiller EMT-P (October 03, 2010 1:57 PM)  Medication # 1:    Medication: Albuterol Sulfate Sol 3.0mg  unit dose    Diagnosis: RESP. S.O.B.    Dose: 2    Route: inhaled    Exp Date: 06/26/2011    Lot #: [id number]    Mfr: DEY    Patient tolerated medication without complications    Given by: Levonne Spiller EMT-P (October 03, 2010 2:02 PM)  Medication # 2:    Medication: Ipratropium inhalation sol. 0.5mg  unit dose    Diagnosis: RESP. S.O.B.    Dose: 2    Route: inhaled    Exp Date: 06/26/2011    Lot #: [id number]    Mfr: DEY    Patient tolerated medication without complications    Given by: Perlie Gold  Laveda Norman EMT-P (October 03, 2010 2:03 PM)    The patient's respiratory status improved after the nebulizer treatments.  The patient was monitored in the office for 25 mins and repeat pulse ox was 99% on RA.   The patient was advised to Return or go to the ER if no improvement or symptoms getting worse.  The patient verbalized clear understanding.     Appended Document: cough,chest congestion/jbb Pt. was notified of her current chest xray results. Pt. also advised this office that the med given to her, Levoflaxacin, Had caused a severe allergic reaction. Pt. went to [facility] for care on  10/03/2010. Pt. was sent home from [facility] and was given a Z-pak for her infection. / rwt

## 2010-10-11 NOTE — Letter (Signed)
Summary: Out of Work  [facility]  [address]   [place], [place] [address]   Phone: [phone]  Fax: [phone]    October 03, 2010   Employee:  TRAMAINE SNELL    To Whom It May Concern:   For Medical reasons, please excuse the above named employee from work for the following dates:  Start:   October 03, 2010   End:   October 08, 2010  If you need additional information, please feel free to contact our office.         Sincerely,    Standley Dakins MD

## 2010-10-11 NOTE — Letter (Signed)
Summary: out of work excuse  out of work excuse   Imported By: Erskine Squibb Breitmeier 10/04/2010 13:00:20  _____________________________________________________________________  External Attachment:    Type:   Image     Comment:   External Document

## 2010-10-11 NOTE — Miscellaneous (Signed)
Summary: Reaction to Levofloxacin  Clinical Lists Changes  Allergies: Added new allergy or adverse reaction of LEVAQUIN  When we called to speak with the patient we learned that she had an allergic reaction to the levofloxacin antibiotic.  She had to go to the ER and be treated.  She did fine and was sent home on Zithromax and seems to be tolerating it well at this time.  Rodney Langton, MD, CDE, [employer]

## 2010-10-14 ENCOUNTER — Encounter: Payer: Self-pay | Admitting: Pulmonary Disease

## 2010-10-23 NOTE — Miscellaneous (Signed)
Summary: optimal pressure 17cm for cpap   Clinical Lists Changes  Orders: Added new Referral order of DME Referral (DME) - Signed auto shows optimal pressure 17cm, but only averaged 3hrs a night

## 2010-11-01 NOTE — Letter (Signed)
Summary: [facility]  [facility]   Imported By: Maryln Gottron 10/22/2010 14:59:36  _____________________________________________________________________  External Attachment:    Type:   Image     Comment:   External Document

## 2010-11-29 ENCOUNTER — Other Ambulatory Visit: Payer: Self-pay | Admitting: *Deleted

## 2010-11-29 MED ORDER — ESTERIFIED ESTROGENS 0.625 MG PO TABS: 0.6250 mg | ORAL_TABLET | Freq: Every day | ORAL | Status: DC

## 2010-12-10 ENCOUNTER — Encounter: Payer: Self-pay | Admitting: Family Medicine

## 2010-12-12 ENCOUNTER — Ambulatory Visit (INDEPENDENT_AMBULATORY_CARE_PROVIDER_SITE_OTHER): Payer: BC Managed Care – PPO | Admitting: Family Medicine

## 2010-12-12 ENCOUNTER — Encounter: Payer: Self-pay | Admitting: Family Medicine

## 2010-12-12 DIAGNOSIS — J069 Acute upper respiratory infection, unspecified: Secondary | ICD-10-CM

## 2010-12-12 MED ORDER — AZITHROMYCIN 250 MG PO TABS: ORAL_TABLET | ORAL | Status: AC

## 2010-12-12 MED ORDER — HYDROCOD POLST-CHLORPHEN POLST 10-8 MG/5ML PO LQCR: 5.0000 mL | Freq: Every evening | ORAL | Status: DC | PRN

## 2010-12-12 NOTE — Assessment & Plan Note (Signed)
Chest congestion biggest problem. Pt tolerating thusfar. See instructions.

## 2010-12-12 NOTE — Progress Notes (Signed)
  Subjective:    Patient ID: Jennifer Pineda, female    DOB: 02/02/1951, 60 y.o.   MRN: [id number]  HPI Pt here for cough and congestion. She has no fever or chills but did over the weekend. She has no headache, no ear pain, no rhinitis, some ST from the drainage. She has cough with some production occas that is white to yellow that has gradually worsened. She denies N/V, some diarrhea off and on. She is minimally achey except in the chest and the middle of the back. She is also very tired. She has had trouble sleepinhg due to the cough, at times noyt being able to quit. She was seen by allergy in Feb and given Advair which she has been using regularly..can't tell that this has made a difference.     Review of SystemsNoncontributory except as above.       Objective:   Physical Exam  Constitutional: She appears well-developed and well-nourished. No distress.       Congested slightly in the nose.  HENT:  Head: Normocephalic and atraumatic.  Right Ear: External ear normal.  Left Ear: External ear normal.  Nose: Nose normal.  Mouth/Throat: Oropharynx is clear and moist. No oropharyngeal exudate.  Eyes: Conjunctivae and EOM are normal. Pupils are equal, round, and reactive to light.  Neck: Normal range of motion. Neck supple. No thyromegaly present.  Cardiovascular: Normal rate, regular rhythm and normal heart sounds.   Pulmonary/Chest: Effort normal and breath sounds normal. She has no wheezes. She has no rales.       Slightly wet cough with overt chest congestion.  Lymphadenopathy:    She has no cervical adenopathy.  Skin: She is not diaphoretic.          Assessment & Plan:

## 2010-12-12 NOTE — Patient Instructions (Signed)
Take Zithro Take Guaifenesin (400mg ), take 11/2 tabs by mouth AM and NOON. Get GUAIFENESIN by  [facility] or [facility] and getting MUCOUS RELIEF EXPECTORANT/CONGESTION. DO NOT GET MUCINEX (Timed Release Guaifenesin)  Drink lots of fluids. Tylenol 1-2 tabs po q4h prn Keep lozenge in mouth all day long while awake to decrease reactive cough.  Take Tussionex at night as needed. Cont Advair.

## 2011-01-08 ENCOUNTER — Other Ambulatory Visit: Payer: Self-pay | Admitting: *Deleted

## 2011-01-08 MED ORDER — FLUOXETINE HCL 20 MG PO CAPS: 20.0000 mg | ORAL_CAPSULE | Freq: Every day | ORAL | Status: DC

## 2011-01-08 MED ORDER — ATORVASTATIN CALCIUM 10 MG PO TABS: 10.0000 mg | ORAL_TABLET | Freq: Every day | ORAL | Status: DC

## 2011-01-08 MED ORDER — OMEPRAZOLE 40 MG PO CPDR: 40.0000 mg | DELAYED_RELEASE_CAPSULE | Freq: Every day | ORAL | Status: DC

## 2011-01-08 NOTE — Telephone Encounter (Signed)
Faxed form from [facility] is on your desk, they are asking for new scripts for meds.

## 2011-01-09 ENCOUNTER — Encounter: Payer: Self-pay | Admitting: Pulmonary Disease

## 2011-01-09 ENCOUNTER — Other Ambulatory Visit: Payer: Self-pay | Admitting: *Deleted

## 2011-01-09 MED ORDER — OMEPRAZOLE 40 MG PO CPDR: 40.0000 mg | DELAYED_RELEASE_CAPSULE | Freq: Every day | ORAL | Status: DC

## 2011-01-09 MED ORDER — FLUOXETINE HCL 20 MG PO CAPS: 20.0000 mg | ORAL_CAPSULE | Freq: Every day | ORAL | Status: DC

## 2011-01-09 MED ORDER — ATORVASTATIN CALCIUM 10 MG PO TABS: 10.0000 mg | ORAL_TABLET | Freq: Every day | ORAL | Status: DC

## 2011-01-09 NOTE — Telephone Encounter (Signed)
Completed form faxed to [facility] as instructed

## 2011-01-10 NOTE — Telephone Encounter (Signed)
Forms faxed by Rene Kocher.

## 2011-01-16 ENCOUNTER — Encounter: Payer: Self-pay | Admitting: Pulmonary Disease

## 2011-01-16 ENCOUNTER — Ambulatory Visit (INDEPENDENT_AMBULATORY_CARE_PROVIDER_SITE_OTHER): Payer: BC Managed Care – PPO | Admitting: Pulmonary Disease

## 2011-01-16 VITALS — BP 124/60 | HR 64 | Temp 98.0°F | Ht 64.0 in | Wt 189.6 lb

## 2011-01-16 DIAGNOSIS — G4733 Obstructive sleep apnea (adult) (pediatric): Secondary | ICD-10-CM

## 2011-01-16 NOTE — Progress Notes (Signed)
  Subjective:    Patient ID: Jennifer Pineda, female    DOB: 30-Sep-1950, 60 y.o.   MRN: [id number]  HPI The pt comes in today for f/u of her severe osa.  She is wearing cpap compliantly, but is continuing to have issues with mask leaks.  She has tried different types without success.  She has no issues with pressure tolerance.  When she is able to wear, sees a big difference in her sleep    Review of Systems  Constitutional: Negative for fever and unexpected weight change.  HENT: Positive for congestion, sore throat, sneezing, trouble swallowing and sinus pressure. Negative for ear pain, nosebleeds, rhinorrhea, dental problem and postnasal drip.   Eyes: Positive for redness and itching.  Respiratory: Positive for shortness of breath and wheezing. Negative for cough and chest tightness.   Cardiovascular: Positive for leg swelling. Negative for palpitations.  Gastrointestinal: Negative for nausea and vomiting.  Genitourinary: Negative for dysuria.  Musculoskeletal: Positive for joint swelling.  Skin: Negative for rash.  Neurological: Positive for headaches.  Hematological: Does not bruise/bleed easily.  Psychiatric/Behavioral: Negative for dysphoric mood. The patient is not nervous/anxious.        Objective:   Physical Exam Ow female in nad No skin breakdown or pressure necrosis from cpap mask LE without edema, no cyanosis Alert, not sleepy, moves all 4        Assessment & Plan:

## 2011-01-16 NOTE — Patient Instructions (Signed)
Will try changing to a nasal mask or nasal pillows Please call me in 2 weeks and let me know how things are going Continue to work on weight loss Make an appt to followup with me in one year, but we will troubleshoot the cpap issues as needed.

## 2011-01-16 NOTE — Assessment & Plan Note (Signed)
The pt is tolerating cpap well from a pressure standpoint, but is still having mask issues.  She has tried different full face masks without success.  I think we should try a nasal mask to see if she tolerates better.  I have also asked her to work more aggressively on weight loss.

## 2011-02-05 ENCOUNTER — Other Ambulatory Visit: Payer: Self-pay | Admitting: *Deleted

## 2011-02-05 MED ORDER — TRIAMTERENE-HCTZ 37.5-25 MG PO TABS: 1.0000 | ORAL_TABLET | Freq: Every day | ORAL | Status: DC

## 2011-02-26 ENCOUNTER — Other Ambulatory Visit: Payer: Self-pay | Admitting: *Deleted

## 2011-02-26 MED ORDER — AMLODIPINE BESYLATE 10 MG PO TABS: 10.0000 mg | ORAL_TABLET | Freq: Every day | ORAL | Status: DC

## 2011-02-26 NOTE — Telephone Encounter (Signed)
Received faxed refill request from pharmacy. Refill sent in electronically. 

## 2011-03-13 ENCOUNTER — Encounter: Payer: Self-pay | Admitting: Family Medicine

## 2011-03-13 ENCOUNTER — Ambulatory Visit (INDEPENDENT_AMBULATORY_CARE_PROVIDER_SITE_OTHER): Payer: BC Managed Care – PPO | Admitting: Family Medicine

## 2011-03-13 VITALS — BP 160/70 | HR 68 | Temp 98.7°F | Wt 187.0 lb

## 2011-03-13 DIAGNOSIS — J069 Acute upper respiratory infection, unspecified: Secondary | ICD-10-CM

## 2011-03-13 MED ORDER — AZITHROMYCIN 250 MG PO TABS: 250.0000 mg | ORAL_TABLET | Freq: Every day | ORAL | Status: AC

## 2011-03-13 NOTE — Progress Notes (Signed)
Jennifer Pineda, a 60 y.o. female presents today in the office for the following:    Has a history of chronic bronchitis, maybe asthma. Takes an inhaler. Has been sick a few days  No wheezing  Some white phlegm. Eating and drinking ok.   Patent presents with runny nose, sneezing, cough, sore throat, malaise and minimal / low-grade fever .   ? recent exposure to others with similar symptoms.   The patent denies sore throat as the primary complaint. Denies sthortness of breath/wheezing, high fever, chest pain, rhinits for more than 14 days, significant myalgia, otalgia, facial pain, abdominal pain, changes in bowel or bladder.  PMH, PHS, Allergies, Problem List, Medications, Family History, and Social History have all been reviewed.  ROS: as above, eating and drinking - tolerating PO. Urinating normally. No excessive vomitting or diarrhea. O/w as above.  PHYSICAL EXAM  Blood pressure 160/70, pulse 68, temperature 98.7 F (37.1 C), temperature source Oral, weight 187 lb (84.823 kg).  PE: GEN: WDWN, Non-toxic, Atraumatic, normocephalic. A and O x 3. HEENT: Oropharynx clear without exudate, MMM, no significant LAD, mild rhinnorhea Ears: TM clear, COL visualized with good landmarks CV: RRR, no m/g/r. Pulm: CTA B, no wheezes, rhonchi, or crackles, normal respiratory effort. EXT: no c/c/e Psych: well oriented, neither depressed nor anxious in appearance  A/P: 1. URI. Supportive care reviewed with patient. See patient instruction section. Given h/o pulmonary disease dx at allergist -- i am going to give some azithro to hold in case worsens. Discussed I suspect viral and treat with supportive care.

## 2011-04-17 ENCOUNTER — Other Ambulatory Visit: Payer: Self-pay | Admitting: *Deleted

## 2011-04-17 MED ORDER — FLUOXETINE HCL 20 MG PO CAPS: 20.0000 mg | ORAL_CAPSULE | Freq: Every day | ORAL | Status: DC

## 2011-04-17 MED ORDER — ATORVASTATIN CALCIUM 10 MG PO TABS: 10.0000 mg | ORAL_TABLET | Freq: Every day | ORAL | Status: DC

## 2011-04-18 ENCOUNTER — Other Ambulatory Visit: Payer: Self-pay | Admitting: Family Medicine

## 2011-04-18 DIAGNOSIS — I10 Essential (primary) hypertension: Secondary | ICD-10-CM

## 2011-04-18 DIAGNOSIS — K2980 Duodenitis without bleeding: Secondary | ICD-10-CM

## 2011-04-18 DIAGNOSIS — E7800 Pure hypercholesterolemia, unspecified: Secondary | ICD-10-CM

## 2011-04-25 ENCOUNTER — Other Ambulatory Visit (INDEPENDENT_AMBULATORY_CARE_PROVIDER_SITE_OTHER): Payer: BC Managed Care – PPO

## 2011-04-25 DIAGNOSIS — I10 Essential (primary) hypertension: Secondary | ICD-10-CM

## 2011-04-25 DIAGNOSIS — K2980 Duodenitis without bleeding: Secondary | ICD-10-CM

## 2011-04-25 DIAGNOSIS — E7800 Pure hypercholesterolemia, unspecified: Secondary | ICD-10-CM

## 2011-04-25 LAB — CBC WITH DIFFERENTIAL/PLATELET
Basophils Absolute: 0 10*3/uL (ref 0.0–0.1)
Eosinophils Absolute: 0.4 10*3/uL (ref 0.0–0.7)
Hemoglobin: 10.2 g/dL — ABNORMAL LOW (ref 12.0–15.0)
Lymphocytes Relative: 28.5 % (ref 12.0–46.0)
Lymphs Abs: 2.7 10*3/uL (ref 0.7–4.0)
MCHC: 32.1 g/dL (ref 30.0–36.0)
MCV: 77.3 fl — ABNORMAL LOW (ref 78.0–100.0)
Monocytes Absolute: 0.7 10*3/uL (ref 0.1–1.0)
Neutro Abs: 5.6 10*3/uL (ref 1.4–7.7)
RDW: 17.3 % — ABNORMAL HIGH (ref 11.5–14.6)

## 2011-04-25 LAB — BASIC METABOLIC PANEL
CO2: 29 mEq/L (ref 19–32)
Calcium: 8.6 mg/dL (ref 8.4–10.5)
Chloride: 103 mEq/L (ref 96–112)
Glucose, Bld: 138 mg/dL — ABNORMAL HIGH (ref 70–99)
Sodium: 139 mEq/L (ref 135–145)

## 2011-04-25 LAB — HEPATIC FUNCTION PANEL
ALT: 14 U/L (ref 0–35)
AST: 19 U/L (ref 0–37)
Bilirubin, Direct: 0 mg/dL (ref 0.0–0.3)
Total Bilirubin: 0.1 mg/dL — ABNORMAL LOW (ref 0.3–1.2)

## 2011-04-25 LAB — LIPID PANEL: VLDL: 18.2 mg/dL (ref 0.0–40.0)

## 2011-05-01 ENCOUNTER — Ambulatory Visit (INDEPENDENT_AMBULATORY_CARE_PROVIDER_SITE_OTHER): Payer: BC Managed Care – PPO | Admitting: Family Medicine

## 2011-05-01 ENCOUNTER — Encounter: Payer: Self-pay | Admitting: Family Medicine

## 2011-05-01 DIAGNOSIS — F341 Dysthymic disorder: Secondary | ICD-10-CM

## 2011-05-01 DIAGNOSIS — I10 Essential (primary) hypertension: Secondary | ICD-10-CM

## 2011-05-01 DIAGNOSIS — G4733 Obstructive sleep apnea (adult) (pediatric): Secondary | ICD-10-CM

## 2011-05-01 DIAGNOSIS — R739 Hyperglycemia, unspecified: Secondary | ICD-10-CM

## 2011-05-01 DIAGNOSIS — E7800 Pure hypercholesterolemia, unspecified: Secondary | ICD-10-CM

## 2011-05-01 DIAGNOSIS — J309 Allergic rhinitis, unspecified: Secondary | ICD-10-CM

## 2011-05-01 DIAGNOSIS — B029 Zoster without complications: Secondary | ICD-10-CM

## 2011-05-01 DIAGNOSIS — M129 Arthropathy, unspecified: Secondary | ICD-10-CM

## 2011-05-01 DIAGNOSIS — E119 Type 2 diabetes mellitus without complications: Secondary | ICD-10-CM | POA: Insufficient documentation

## 2011-05-01 DIAGNOSIS — M1990 Unspecified osteoarthritis, unspecified site: Secondary | ICD-10-CM

## 2011-05-01 DIAGNOSIS — E118 Type 2 diabetes mellitus with unspecified complications: Secondary | ICD-10-CM | POA: Insufficient documentation

## 2011-05-01 DIAGNOSIS — R7309 Other abnormal glucose: Secondary | ICD-10-CM

## 2011-05-01 HISTORY — DX: Type 2 diabetes mellitus without complications: E11.9

## 2011-05-01 NOTE — Patient Instructions (Signed)
RTC one year with new doctor for Comp Exam and reasess of hyperglycemia.

## 2011-05-01 NOTE — Assessment & Plan Note (Signed)
Per Dr Shelle Iron. Is trying to deal with air leaks. Hasn't really lost weight but knows she needs to .

## 2011-05-01 NOTE — Progress Notes (Signed)
  Subjective:    Patient ID: Jennifer Pineda, female    DOB: Aug 19, 1951, 60 y.o.   MRN: [id number]  HPI Pt here for Comp Exasm. She has had hysterectomy not for cerviacl cancer in the past w/o abnormal Pap since. She has recently been worked up, diagnosed and treated for Sleep Apnea. She is still having leak. Has tried multiple masks. Her son also uses CPAP. She also has a bony protuberance on her right distal phalanx of the thumb....has had injection but came back.    Review of Systems  Constitutional: Negative for fever, chills, diaphoresis, activity change, appetite change, fatigue and unexpected weight change.  HENT: Negative for hearing loss, ear pain, nosebleeds, rhinorrhea, trouble swallowing, tinnitus and ear discharge.   Eyes: Negative for pain, discharge, redness and visual disturbance.  Respiratory: Positive for chest tightness, shortness of breath and wheezing. Negative for cough.        Stopped smoking in 1988, continues to have noise.  Cardiovascular: Negative for chest pain, palpitations and leg swelling.       SOB with exertion.  Gastrointestinal: Negative for nausea, vomiting, abdominal pain, diarrhea, constipation, blood in stool and abdominal distention.  Genitourinary: Negative for dysuria and frequency.  Musculoskeletal: Negative for myalgias and back pain. Arthralgias: Arthritis of hands most R>L. R ankle from sprain long ago, Knees also.   Skin: Negative for rash.  Neurological: Negative for dizziness, tremors, syncope and numbness.  Hematological: Negative for adenopathy. Does not bruise/bleed easily.  Psychiatric/Behavioral: Negative for hallucinations and agitation. The patient is not nervous/anxious.        Objective:   Physical Exam  Constitutional: She is oriented to person, place, and time. She appears well-developed and well-nourished. No distress.  HENT:  Head: Normocephalic and atraumatic.  Left Ear: External ear normal.  Nose: Nose normal.    Mouth/Throat: Oropharynx is clear and moist. No oropharyngeal exudate.  Eyes: Conjunctivae and EOM are normal. Pupils are equal, round, and reactive to light. No scleral icterus.  Neck: Normal range of motion. Neck supple. No thyromegaly present.  Cardiovascular: Normal rate, regular rhythm and normal heart sounds.  Exam reveals no friction rub.   No murmur heard. Pulmonary/Chest: Effort normal and breath sounds normal. No respiratory distress. She has no wheezes. She has no rales.  Abdominal: Soft. Bowel sounds are normal. She exhibits no mass. There is no tenderness.  Genitourinary:       Introitus nml, ut/cx absent, adnexa NT w/o mass. Rectal not done.  Musculoskeletal: Normal range of motion. She exhibits no edema and no tenderness.       Mild swelling and disfigurement of fingers, R hand > L.  Lymphadenopathy:    She has no cervical adenopathy.  Neurological: She is alert and oriented to person, place, and time. She has normal reflexes.  Skin: Skin is warm and dry. No rash noted. She is not diaphoretic. No erythema.  Psychiatric: She has a normal mood and affect. Her behavior is normal. Judgment and thought content normal.          Assessment & Plan:  HMPE

## 2011-05-01 NOTE — Assessment & Plan Note (Signed)
Doing great. Cont Prozac. Has declined stopping in the past. Remembers feeling really poorly.

## 2011-05-01 NOTE — Assessment & Plan Note (Signed)
Adequate on her meds. Cont prn.

## 2011-05-01 NOTE — Assessment & Plan Note (Signed)
Says was not fasting when done but sugar high enough to be a concern. Avoid sweets and carbs. Check next year to reasess. Weight loss will help avoid diabetes or high sugar.

## 2011-05-01 NOTE — Assessment & Plan Note (Signed)
Hers looks to be rheumatoid but slow organizing. Discussed being seen by Rheum when sxs worsen.  Hers is worst in hands, knees and ankles. Discussed Glucosamine/chondroitin (Triflex) and Hyaluronic acid supplementation as a 6 month trial for knees and poss ankles.

## 2011-05-01 NOTE — Assessment & Plan Note (Signed)
Suggest Zostavax eventually.

## 2011-05-01 NOTE — Assessment & Plan Note (Signed)
Good control. Cont curr meds. 

## 2011-05-01 NOTE — Assessment & Plan Note (Signed)
Great nos. Cont Lipitor.

## 2011-05-07 ENCOUNTER — Ambulatory Visit: Payer: Self-pay | Admitting: Family Medicine

## 2011-05-07 ENCOUNTER — Encounter: Payer: Self-pay | Admitting: Family Medicine

## 2011-05-09 ENCOUNTER — Encounter: Payer: Self-pay | Admitting: *Deleted

## 2011-05-10 ENCOUNTER — Other Ambulatory Visit: Payer: Self-pay | Admitting: Family Medicine

## 2011-05-10 ENCOUNTER — Other Ambulatory Visit: Payer: BC Managed Care – PPO

## 2011-05-10 DIAGNOSIS — Z0000 Encounter for general adult medical examination without abnormal findings: Secondary | ICD-10-CM

## 2011-05-14 ENCOUNTER — Encounter: Payer: Self-pay | Admitting: *Deleted

## 2011-07-12 ENCOUNTER — Other Ambulatory Visit: Payer: Self-pay | Admitting: Family Medicine

## 2011-08-07 ENCOUNTER — Encounter: Payer: Self-pay | Admitting: Family Medicine

## 2011-08-07 ENCOUNTER — Ambulatory Visit (INDEPENDENT_AMBULATORY_CARE_PROVIDER_SITE_OTHER): Payer: BC Managed Care – PPO | Admitting: Family Medicine

## 2011-08-07 VITALS — BP 136/64 | HR 60 | Temp 98.3°F | Wt 187.2 lb

## 2011-08-07 DIAGNOSIS — J45909 Unspecified asthma, uncomplicated: Secondary | ICD-10-CM | POA: Insufficient documentation

## 2011-08-07 DIAGNOSIS — R05 Cough: Secondary | ICD-10-CM

## 2011-08-07 MED ORDER — PREDNISONE 20 MG PO TABS: 40.0000 mg | ORAL_TABLET | Freq: Every day | ORAL | Status: DC

## 2011-08-07 MED ORDER — ALBUTEROL SULFATE (2.5 MG/3ML) 0.083% IN NEBU
2.5000 mg | INHALATION_SOLUTION | Freq: Once | RESPIRATORY_TRACT | Status: AC
  Administered 2011-08-07: 2.5 mg via RESPIRATORY_TRACT

## 2011-08-07 MED ORDER — IPRATROPIUM BROMIDE 0.02 % IN SOLN
0.5000 mg | Freq: Once | RESPIRATORY_TRACT | Status: AC
  Administered 2011-08-07: 0.5 mg via RESPIRATORY_TRACT

## 2011-08-07 MED ORDER — AZITHROMYCIN 250 MG PO TABS: ORAL_TABLET | ORAL | Status: AC

## 2011-08-07 NOTE — Patient Instructions (Addendum)
I think you have asthma flare from initial sinusitis, could also be developing bronchitis. Treat with prednisone course x 7 days as well as another zpack to help cover lungs. Use albuterol regularly every 4-6 hours for the next 3 days. Let us know if not improving as expected or any fever >101, or worsening productive cough or worsening chest tightness.

## 2011-08-07 NOTE — Progress Notes (Signed)
  Subjective:    Patient ID: Jennifer Pineda, female    DOB: Oct 16, 1950, 60 y.o.   MRN: [id number]  HPI CC: cough, congestion  61 yo pt of Dr. Kathie Rhodes' new to me with h/o extrinsic asthma and allergic rhinitis presents with 1 wk h/o cough and chest tightness, also dizziness with rapid position changes.  Ribs hurting from coughing.  Throat feels swollen and sore.  Feeling SOB, tight, wheezy.  Right sided ear pain.  Last 2 days having thick sputum from cough.  Chest tightness similar to tightness she has felt in past when with bronchitis.  No fevers/chills, abd pain, nausea/vomiting, myalgia, arthralgia, tooth pain.  Using albuterol which does help SOB.  Hasn't tried anything OTC yet.  2 wks ago had sinus infection, seen by Dr. Gary Fleet, treated with zpack.  Sinus sxs improved.  No smokers at home.  Daughter is coughing as well.  Review of Systems per HPI    Objective:   Physical Exam  Nursing note and vitals reviewed. Constitutional: She appears well-developed and well-nourished. No distress.  HENT:  Head: Normocephalic and atraumatic.  Right Ear: Hearing, tympanic membrane, external ear and ear canal normal.  Left Ear: Hearing, tympanic membrane, external ear and ear canal normal.  Nose: No mucosal edema or rhinorrhea. Right sinus exhibits no maxillary sinus tenderness and no frontal sinus tenderness. Left sinus exhibits no maxillary sinus tenderness and no frontal sinus tenderness.  Mouth/Throat: Uvula is midline, oropharynx is clear and moist and mucous membranes are normal. No oropharyngeal exudate, posterior oropharyngeal edema, posterior oropharyngeal erythema or tonsillar abscesses.       Congested behind TMs  Eyes: Conjunctivae and EOM are normal. Pupils are equal, round, and reactive to light. No scleral icterus.  Neck: Normal range of motion. Neck supple.  Cardiovascular: Normal rate, regular rhythm, normal heart sounds and intact distal pulses.   No murmur heard. Pulmonary/Chest:  Effort normal and breath sounds normal. No respiratory distress. She has no wheezes. She has no rales.  Lymphadenopathy:    She has no cervical adenopathy.  Skin: Skin is warm and dry. No rash noted.  after alb/atrovent neb, jittery, no change in breathing.  Lungs remain clear.     Assessment & Plan:

## 2011-08-07 NOTE — Assessment & Plan Note (Addendum)
Does not know baseline peak flow. Expected peak flow for age/height = 410 L/min. Actual 260, 63% predicted. After alb/atrovent neb - no change. Will treat as asthma exac with prednisone course. Several allergies to abx (PCN, levo, doxy).  Provided with another zpack in case worsening productive cough, or fever.

## 2011-10-11 ENCOUNTER — Other Ambulatory Visit: Payer: Self-pay | Admitting: Family Medicine

## 2011-10-17 ENCOUNTER — Encounter: Payer: Self-pay | Admitting: Family Medicine

## 2011-10-17 ENCOUNTER — Ambulatory Visit (INDEPENDENT_AMBULATORY_CARE_PROVIDER_SITE_OTHER)
Admission: RE | Admit: 2011-10-17 | Discharge: 2011-10-17 | Disposition: A | Discharge status: Home / Self Care | Payer: BC Managed Care – PPO | Source: Ambulatory Visit | Attending: Family Medicine | Admitting: Family Medicine

## 2011-10-17 ENCOUNTER — Ambulatory Visit (INDEPENDENT_AMBULATORY_CARE_PROVIDER_SITE_OTHER): Payer: BC Managed Care – PPO | Admitting: Family Medicine

## 2011-10-17 VITALS — BP 138/66 | HR 76 | Temp 98.5°F | Wt 189.5 lb

## 2011-10-17 DIAGNOSIS — R05 Cough: Secondary | ICD-10-CM

## 2011-10-17 MED ORDER — PREDNISONE 20 MG PO TABS: ORAL_TABLET | ORAL | Status: DC

## 2011-10-17 NOTE — Assessment & Plan Note (Addendum)
Prior thought asthma exac.  Never fully resolved however.  2+ mo h/o cough. Reports compliant with advair 115/21 2 puffs bid as well as flonase, omeprazole. Treat with steroid taper today. ?GERD contributing - increase PPI to bid dosing. Xray today given remote smoker and h/o continued cough - clear on my read.

## 2011-10-17 NOTE — Progress Notes (Signed)
  Subjective:    Patient ID: Jennifer Pineda, female    DOB: 07-09-1951, 61 y.o.   MRN: [id number]  HPI CC: cough  61 yo with h/o asthma, GERD, former smoker, on estrogen presents with continued cough.  Seen here 12/12 with conecern for asthma flare after sinusitis prior treated with zpack by allergist.  I treated with prednisone course as well as provided with zpack to fill in case deterioration.  Thinks prednisone helped.  Filled abx, helped.  Never fully resolved.  Has had chronic cough since then.  occ SOB and wheezy, esp after coughing fits.  Some white sputum production.  Takes omeprazole for GERD.  H/o HH as well.  Takes flonase regularly for allergic rhinitis.  Otherwise feeling fine.  No congestion.  No fevers/chills, heartburn sxs (states PPI controls sxs well).  Gets coughing after every meal.  Tries to avoid dust,mold,dog hair.  No recent xray.    Past Medical History  Diagnosis Date  . GERD (gastroesophageal reflux disease)   . Essential hypertension   . Hypercholesteremia   . Anxiety and depression   . Hematuria 03/27/2001    FH, transitional cell CA of Kidney ? work up neg (Dr. Barnie Alderman)  . Extrinsic asthma   . Allergic rhinitis    Review of Systems Per HPI    Objective:   Physical Exam  Nursing note and vitals reviewed. Constitutional: She appears well-developed and well-nourished. No distress.  HENT:  Head: Normocephalic and atraumatic.  Right Ear: Hearing, tympanic membrane, external ear and ear canal normal.  Left Ear: Hearing, tympanic membrane, external ear and ear canal normal.  Nose: No mucosal edema or rhinorrhea. Right sinus exhibits no maxillary sinus tenderness and no frontal sinus tenderness. Left sinus exhibits no maxillary sinus tenderness and no frontal sinus tenderness.  Mouth/Throat: Uvula is midline, oropharynx is clear and moist and mucous membranes are normal. No oropharyngeal exudate, posterior oropharyngeal edema, posterior  oropharyngeal erythema or tonsillar abscesses.       white post nasal drainage  Eyes: Conjunctivae and EOM are normal. Pupils are equal, round, and reactive to light. No scleral icterus.  Neck: Normal range of motion. Neck supple.  Cardiovascular: Normal rate, regular rhythm and intact distal pulses.   Murmur (2/6 SEM) heard. Pulmonary/Chest: Effort normal and breath sounds normal. No respiratory distress. She has no wheezes. She has no rales.       Lungs clear  Musculoskeletal: She exhibits edema (tr pedal edema).  Lymphadenopathy:    She has no cervical adenopathy.  Skin: Skin is warm and dry. No rash noted.  Psychiatric: She has a normal mood and affect.      Assessment & Plan:

## 2011-10-17 NOTE — Patient Instructions (Addendum)
I think you have continued asthma. Treat with steroid taper - sent to pharmacy. Push fluids and rest. Continue mucinex. I'd also like you to take omeprazole twice daily for 2 weeks to see if improvement. Let us know if not better, consider seeing Dr. Gary Fleet again.

## 2012-01-17 ENCOUNTER — Ambulatory Visit: Payer: BC Managed Care – PPO | Admitting: Pulmonary Disease

## 2012-03-17 ENCOUNTER — Encounter: Payer: Self-pay | Admitting: Family Medicine

## 2012-03-17 ENCOUNTER — Ambulatory Visit (INDEPENDENT_AMBULATORY_CARE_PROVIDER_SITE_OTHER): Payer: BC Managed Care – PPO | Admitting: Family Medicine

## 2012-03-17 VITALS — BP 128/70 | HR 65 | Temp 98.4°F | Ht 64.0 in | Wt 189.0 lb

## 2012-03-17 DIAGNOSIS — M5430 Sciatica, unspecified side: Secondary | ICD-10-CM

## 2012-03-17 DIAGNOSIS — M5431 Sciatica, right side: Secondary | ICD-10-CM

## 2012-03-17 DIAGNOSIS — M5416 Radiculopathy, lumbar region: Secondary | ICD-10-CM | POA: Insufficient documentation

## 2012-03-17 MED ORDER — NAPROXEN 500 MG PO TABS: ORAL_TABLET | ORAL | Status: DC

## 2012-03-17 MED ORDER — PREDNISONE 20 MG PO TABS: ORAL_TABLET | ORAL | Status: DC

## 2012-03-17 NOTE — Assessment & Plan Note (Addendum)
Anticipate sciatica, discussed this.  No neurological sxs today other than pain. Treat with steroid course and then naprosyn, discussed gi precautions and to take with food.. Has taken steroids in past. Provided with exercises from [facility] pt advisor on piriformis syndrome.

## 2012-03-17 NOTE — Patient Instructions (Addendum)
We have requested records from Dr. Anner Crete. I think you have right sided sciatica. Treat with course of prednisone and then may start naprosyn twice daily with food for 1 week then as needed for back/leg pain (stop over the counter anti inflammatories) If not improving with this treatment, please let me know.  Sciatica Sciatica is a weakness and/or changes in sensation (tingling, jolts, hot and cold, numbness) along the path the sciatic nerve travels. Irritation or damage to lumbar nerve roots is often also referred to as lumbar radiculopathy.  Lumbar radiculopathy (Sciatica) is the most common form of this problem. Radiculopathy can occur in any of the nerves coming out of the spinal cord. The problems caused depend on which nerves are involved. The sciatic nerve is the large nerve supplying the branches of nerves going from the hip to the toes. It often causes a numbness or weakness in the skin and/or muscles that the sciatic nerve serves. It also may cause symptoms (problems) of pain, burning, tingling, or electric shock-like feelings in the path of this nerve. This usually comes from injury to the fibers that make up the sciatic nerve. Some of these symptoms are low back pain and/or unpleasant feelings in the following areas:  From the mid-buttock down the back of the leg to the back of the knee.   And/or the outside of the calf and top of the foot.   And/or behind the inner ankle to the sole of the foot.  CAUSES   Herniated or slipped disc. Discs are the little cushions between the bones in the back.   Pressure by the piriformis muscle in the buttock on the sciatic nerve (Piriformis Syndrome).   Misalignment of the bones in the lower back and buttocks (Sacroiliac Joint Derangement).   Narrowing of the spinal canal that puts pressure on or pinches the fibers that make up the sciatic nerve.   A slipped vertebra that is out of line with those above or beneath it.   Abnormality of the  nervous system itself so that nerve fibers do not transmit signals properly, especially to feet and calves (neuropathy).   Tumor (this is rare).  Your caregiver can usually determine the cause of your sciatica and begin the treatment most likely to help you. TREATMENT  Taking over-the-counter painkillers, physical therapy, rest, exercise, spinal manipulation, and injections of anesthetics and/or steroids may be used. Surgery, acupuncture, and Yoga can also be effective. Mind over matter techniques, mental imagery, and changing factors such as your bed, chair, desk height, posture, and activities are other treatments that may be helpful. You and your caregiver can help determine what is best for you. With proper diagnosis, the cause of most sciatica can be identified and removed. Communication and cooperation between your caregiver and you is essential. If you are not successful immediately, do not be discouraged. With time, a proper treatment can be found that will make you comfortable. HOME CARE INSTRUCTIONS   If the pain is coming from a problem in the back, applying ice to that area for 15 to 20 minutes, 3 to 4 times per day while awake, may be helpful. Put the ice in a plastic bag. Place a towel between the bag of ice and your skin.   You may exercise or perform your usual activities if these do not aggravate your pain, or as suggested by your caregiver.   Only take over-the-counter or prescription medicines for pain, discomfort, or fever as directed by your caregiver.  If your caregiver has given you a follow-up appointment, it is very important to keep that appointment. Not keeping the appointment could result in a chronic or permanent injury, pain, and disability. If there is any problem keeping the appointment, you must call back to this facility for assistance.  SEEK IMMEDIATE MEDICAL CARE IF:   You experience loss of control of bowel or bladder.   You have increasing weakness in the  trunk, buttocks, or legs.   There is numbness in any areas from the hip down to the toes.   You have difficulty walking or keeping your balance.   You have any of the above, with fever or forceful vomiting.  Document Released: 08/06/2001 Document Revised: 08/01/2011 Document Reviewed: 03/25/2008 [facility] Patient Information 2012 [place], [place].

## 2012-03-17 NOTE — Progress Notes (Signed)
  Subjective:    Patient ID: Jennifer Pineda, female    DOB: 04-10-1951, 61 y.o.   MRN: [id number]  HPI CC: R leg pain  Worsening leg pain for last 4 months.  Mild back pain associated with this.  Pain radiates down posterior leg to toes, worse pain at balls of right foot.  Has been going to chiropractor for last 3 months.  Has been receiving adjustments which helps for a few hours, then pain returns.  Told has swollen disks in back.  Has received xrays by chiro showing normal bones according to pt.  Had MRI done by Dr. Anner Crete 5-6 yrs ago.  H/o ruptured disk and bulging disk with prior MRI.  Using 2 aleve bid for pain, not really helping.  This has caused some diarrhea.  Has been taking for 3 weeks regularly.  Also with plantar fasciitis.  No fevers/chills, bowel/bladder incontinence, saddle anesthesia or numbness.  No knee pain or hip pain.  No falls/injury.  Review of Systems Per HPI    Objective:   Physical Exam  Nursing note and vitals reviewed. Constitutional: She appears well-developed and well-nourished. No distress.  Musculoskeletal:       Mild midline spine pain at mid lumbar region. No paraspinous mm pain, some tightness noted on left side. Tender to palpation at R SIJ and greatest R sciatic notch.  No pain with palpation of bilateral GTB. mildly positive SLR on right. Neg FABER, no pain with int/ext rotation at hip. Able to touch toes with only mild pain  Neurological: She has normal strength. No sensory deficit. She displays a negative Romberg sign. Coordination normal.       diminished DTRs bilateral LE Sensation intact. Strength 5/5 BLE. Able to heel and toe walk with some pain.       Assessment & Plan:

## 2012-04-12 ENCOUNTER — Other Ambulatory Visit: Payer: Self-pay | Admitting: Family Medicine

## 2012-04-20 ENCOUNTER — Ambulatory Visit (INDEPENDENT_AMBULATORY_CARE_PROVIDER_SITE_OTHER): Payer: BC Managed Care – PPO | Admitting: Family Medicine

## 2012-04-20 ENCOUNTER — Encounter: Payer: Self-pay | Admitting: Family Medicine

## 2012-04-20 VITALS — BP 140/64 | HR 72 | Temp 98.0°F | Wt 192.2 lb

## 2012-04-20 DIAGNOSIS — M545 Low back pain: Secondary | ICD-10-CM | POA: Insufficient documentation

## 2012-04-20 DIAGNOSIS — M5416 Radiculopathy, lumbar region: Secondary | ICD-10-CM

## 2012-04-20 DIAGNOSIS — IMO0002 Reserved for concepts with insufficient information to code with codable children: Secondary | ICD-10-CM

## 2012-04-20 MED ORDER — LORATADINE 10 MG PO TABS: 10.0000 mg | ORAL_TABLET | Freq: Every day | ORAL | Status: DC

## 2012-04-20 MED ORDER — TRAMADOL HCL 50 MG PO TABS: 50.0000 mg | ORAL_TABLET | Freq: Two times a day (BID) | ORAL | Status: DC | PRN

## 2012-04-20 NOTE — Assessment & Plan Note (Signed)
Affecting ability to do things she enjoys. Previously thought R sided sciatica, failed conservative treatment of steroid course then NSAIDs as well as home exercise program for sciatica. H/o HNP and ruptured disc in past. Will order MRI to evaluate change.  Will again request old records of recent lumbar xray as well as MRI years ago to be able to compare any changes. Treat pain with tramadol. Pt agrees.  To seek urgent care if worsening.

## 2012-04-20 NOTE — Addendum Note (Signed)
Addended by: Eustaquio Boyden on: 04/20/2012 10:46 AM   Modules accepted: Orders

## 2012-04-20 NOTE — Progress Notes (Signed)
  Subjective:    Patient ID: Jennifer Pineda, female    DOB: 1951-05-21, 61 y.o.   MRN: [id number]  HPI CC: f/u R leg pain and lower back.  See prior note for details:  sxs going on for 6 mo now.  Seen here 03/17/2012 with 5 mo h/o back pain, treated as sciatica with steroid course then NSAIDs (naprosyn) as well as piriformis syndrome exercises.  Steroid course and naprosyn (had to cut in 1/2) did help but only minimally.  Exercises hurt, has been walking as well.  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  Continues to see chiropractor (Dr. Anner Crete). H/o ruptured disk and bulging disk with prior MRI (about 5-6 yrs ago), no records available.  From prior note: Worsening R leg pain for last 4 months. Mild back pain associated with this. Pain radiates down posterior leg to toes, worse pain at balls of right foot. Has been going to chiropractor for last 3 months. Has been receiving adjustments which helps for a few hours, then pain returns. Told has swollen disks in back. Has received xrays by chiro showing normal bones according to pt. Had MRI done by Dr. Anner Crete 5-6 yrs ago.  Using 2 aleve bid for pain, not really helping. This has caused some diarrhea. Has been taking for 3 weeks regularly.   Still no fevers/chills, bowel/bladder incontinence, saddle anesthesia or numbness.  No knee pain or hip pain.  No falls/injury.  Past Medical History  Diagnosis Date  . GERD (gastroesophageal reflux disease)   . Essential hypertension   . Hypercholesteremia   . Anxiety and depression   . Hematuria 03/27/2001    FH, transitional cell CA of Kidney ? work up neg (Dr. Barnie Alderman)  . Extrinsic asthma   . Allergic rhinitis     Past Surgical History  Procedure Date  . Partial hysterectomy 1983    ovaries intact endometriosis  . Abd ultrasound 02/2000    normal  . Esophagogastroduodenoscopy 03/2000    hiatal hernia  . Septoplasty 10/2000    Dr.  Haroldine Laws, with windows (?)  . Appendectomy 1983  . Nsvd     x2, (breech, macro)    Review of Systems Per HPI    Objective:   Physical Exam  Nursing note and vitals reviewed. Constitutional: She is oriented to person, place, and time. She appears well-developed and well-nourished. No distress.  Musculoskeletal: She exhibits no edema.       Moderate midline upper lumbar spine tenderness to palpation as well as moderate right paraspinous mm tenderness.  + R sciatic notch pain and mild SIJ pain on right. + SLR test on right with radiculopathy. No pain with rotation at hips or FABER test.  Neurological: She is alert and oriented to person, place, and time. No sensory deficit. She displays a negative Romberg sign. Coordination normal.       Slightly decreased strength on right (4+/5) compared to left. Diminished DTRs symmetrically. Sensation intact throughout. Able to heel and toe walk adequately.       Assessment & Plan:

## 2012-04-20 NOTE — Patient Instructions (Signed)
We will order MRI of back.  Pass by Marion's office to schedule this. May use tramadol as needed for pain, try cutting in 1/2 first.  May cause sedation. You may need to return prior for lumbar xray. Call us if questions or any worsening.

## 2012-04-23 ENCOUNTER — Ambulatory Visit (INDEPENDENT_AMBULATORY_CARE_PROVIDER_SITE_OTHER): Payer: BC Managed Care – PPO | Admitting: Family Medicine

## 2012-04-23 ENCOUNTER — Encounter: Payer: Self-pay | Admitting: Family Medicine

## 2012-04-23 ENCOUNTER — Ambulatory Visit
Admission: RE | Admit: 2012-04-23 | Discharge: 2012-04-23 | Disposition: A | Discharge status: Home / Self Care | Payer: BC Managed Care – PPO | Source: Ambulatory Visit | Attending: Family Medicine | Admitting: Family Medicine

## 2012-04-23 ENCOUNTER — Ambulatory Visit (INDEPENDENT_AMBULATORY_CARE_PROVIDER_SITE_OTHER)
Admission: RE | Admit: 2012-04-23 | Discharge: 2012-04-23 | Disposition: A | Discharge status: Home / Self Care | Payer: BC Managed Care – PPO | Source: Ambulatory Visit | Attending: Family Medicine | Admitting: Family Medicine

## 2012-04-23 VITALS — BP 150/62 | HR 64 | Temp 98.2°F | Wt 192.0 lb

## 2012-04-23 DIAGNOSIS — IMO0002 Reserved for concepts with insufficient information to code with codable children: Secondary | ICD-10-CM

## 2012-04-23 DIAGNOSIS — C7952 Secondary malignant neoplasm of bone marrow: Secondary | ICD-10-CM

## 2012-04-23 DIAGNOSIS — C7951 Secondary malignant neoplasm of bone: Secondary | ICD-10-CM

## 2012-04-23 DIAGNOSIS — C801 Malignant (primary) neoplasm, unspecified: Secondary | ICD-10-CM

## 2012-04-23 DIAGNOSIS — M5416 Radiculopathy, lumbar region: Secondary | ICD-10-CM

## 2012-04-23 DIAGNOSIS — M5126 Other intervertebral disc displacement, lumbar region: Secondary | ICD-10-CM

## 2012-04-23 DIAGNOSIS — R937 Abnormal findings on diagnostic imaging of other parts of musculoskeletal system: Secondary | ICD-10-CM | POA: Insufficient documentation

## 2012-04-23 DIAGNOSIS — D649 Anemia, unspecified: Secondary | ICD-10-CM

## 2012-04-23 LAB — COMPREHENSIVE METABOLIC PANEL
Alkaline Phosphatase: 70 U/L (ref 39–117)
Glucose, Bld: 126 mg/dL — ABNORMAL HIGH (ref 70–99)
Sodium: 137 mEq/L (ref 135–145)
Total Bilirubin: 0.4 mg/dL (ref 0.3–1.2)
Total Protein: 7.4 g/dL (ref 6.0–8.3)

## 2012-04-23 LAB — CBC WITH DIFFERENTIAL/PLATELET
Basophils Absolute: 0 10*3/uL (ref 0.0–0.1)
Eosinophils Relative: 2.7 % (ref 0.0–5.0)
HCT: 39.2 % (ref 36.0–46.0)
Lymphs Abs: 2.6 10*3/uL (ref 0.7–4.0)
MCHC: 32.2 g/dL (ref 30.0–36.0)
MCV: 82.3 fl (ref 78.0–100.0)
Monocytes Absolute: 0.9 10*3/uL (ref 0.1–1.0)
Platelets: 307 10*3/uL (ref 150.0–400.0)
RDW: 17.1 % — ABNORMAL HIGH (ref 11.5–14.6)

## 2012-04-23 LAB — POCT URINALYSIS DIPSTICK
Glucose, UA: NEGATIVE
Leukocytes, UA: NEGATIVE
Nitrite, UA: NEGATIVE

## 2012-04-23 MED ORDER — IOHEXOL 300 MG/ML  SOLN
100.0000 mL | Freq: Once | INTRAMUSCULAR | Status: AC | PRN
  Administered 2012-04-23: 100 mL via INTRAVENOUS

## 2012-04-23 NOTE — Progress Notes (Signed)
Subjective:    Patient ID: Jennifer Pineda, female    DOB: [birth year], 61 y.o.   MRN: [id number]  HPI CC: f/u results  Seen 04/20/2012 with R lumbar radiculopathy, and minimal RLE weakness, MRI ordered.  Tramadol prescribed, helping pain.  I asked pt to return to clinic today to review results of today's MRI with pt:  MRI LUMBAR SPINE WITHOUT CONTRAST  Technique: Multiplanar and multiecho pulse sequences of the lumbar spine were obtained without intravenous contrast.  Comparison: None.  Findings: The patient has multiple osseous lesions including L1, L3, L5, and in the right ilium with a slightly mottled appearance of several other vertebra. All of these findings are consistent with metastatic disease of unknown primary. No pathologic fractures. No extension of tumor of the spinal canal. The L3 vertebra is almost completely replaced by tumor including involvement of the posterior elements.  L5-S1: There is a prominent soft disc protrusion central and to the right compressing the right S1 and S2 nerves.  T11-12 through L2-3: The discs are normal.  L3-4: Small annular tear and disc bulge in the left neural foramen without neural impingement.  L4-5: Small diffuse disc bulge with annular tear at the level of the left neural foramen. Hypertrophy of the ligamenta flava causes slight narrowing of the spinal canal and lateral recesses.  IMPRESSION:  1. Numerous osseous metastases of unknown primary.  2. Focal prominent soft disc protrusion at L5-S1 to the right compressing the right S1 and S2 nerves.  Critical Value/emergent results were called by telephone at the time of interpretation on 04/23/2012 at 11:30 am to Dr. Sharen Hones, who verbally acknowledged these results.   mammogram - birads1 04/2011 Colon cancer screening - iFOB negative 04/2011.  Pt quit smoking 1988  Family history of kidney cancer as well as lung cancer and bone cancer/lymphoma.  Personal h/o hematuria, told 2/2 bladder  infection.  LMP - 1983 - hysterectomy for endometriosis.  Bilateral ovaries remain.  Medications and allergies reviewed and updated in chart.  Past histories reviewed and updated if relevant as below. Patient Active Problem List  Diagnosis  . HYPERCHOLESTEROLEMIA  . ANXIETY DEPRESSION  . OBSTRUCTIVE SLEEP APNEA  . ESSENTIAL HYPERTENSION  . ALLERGIC RHINITIS  . GERD  . GASTRITIS  . DUODENITIS  . DERMATITIS  . ALLERGY  . Arthritis  . Hyperglycemia  . Cough  . Right lumbar radiculopathy   Past Medical History  Diagnosis Date  . GERD (gastroesophageal reflux disease)   . Essential hypertension   . Hypercholesteremia   . Anxiety and depression   . Hematuria 03/27/2001    FH, transitional cell CA of Kidney ? work up neg (Dr. Barnie Alderman)  . Extrinsic asthma   . Allergic rhinitis   . HNP (herniated nucleus pulposus) ~2005    history per pt   Past Surgical History  Procedure Date  . Partial hysterectomy 1983    ovaries intact endometriosis  . Abd ultrasound 02/2000    normal  . Esophagogastroduodenoscopy 03/2000    hiatal hernia  . Septoplasty 10/2000    Dr. Haroldine Laws, with windows (?)  . Appendectomy 1983  . Nsvd     x2, (breech, macro)   History  Substance Use Topics  . Smoking status: Former Smoker -- 1.5 packs/day for 19 years    Quit date: 08/26/1986  . Smokeless tobacco: Never Used  . Alcohol Use: 1.0 oz/week    2 drink(s) per week     occassionally   Family History  Problem  Relation Age of Onset  . Cancer Mother     kidney and lymphoma CA, neprectomy tumor of lung, non smoker  . Kidney disease Mother     T-cell cancer in kidney  . Aneurysm Father     ? in throat  . Diabetes Father   . Heart disease Father     MI  . Diabetes Sister   . Hyperlipidemia Sister   . Heart disease Brother 50    MI  . Alcohol abuse Brother     smokes again  . Hypertension Brother   . Heart disease Maternal Aunt     MI  . Cancer Brother 8    T-cell renal  cancer/ removed  . Hypertension Brother    Allergies  Allergen Reactions  . Ampicillin   . Doxycycline Hyclate   . Levofloxacin     REACTION: Swelling of throat  . Metoprolol Succinate   . Tetracycline Hcl    Current Outpatient Prescriptions on File Prior to Visit  Medication Sig Dispense Refill  . ADVAIR HFA 115-21 MCG/ACT inhaler Inhale 2 puffs into the lungs 2 (two) times daily.       Marland Kitchen amLODipine (NORVASC) 10 MG tablet Take 1 tablet (10 mg total) by mouth at bedtime.  90 tablet  0  . atorvastatin (LIPITOR) 10 MG tablet TAKE ONE TABLET BY MOUTH AT BEDTIME  90 tablet  0  . FLUoxetine (PROZAC) 20 MG capsule TAKE ONE CAPSULE BY MOUTH EVERY DAY  90 capsule  0  . fluticasone (FLONASE) 50 MCG/ACT nasal spray 2 sprays by Nasal route daily.        Marland Kitchen loratadine (CLARITIN) 10 MG tablet Take 1 tablet (10 mg total) by mouth daily.  30 tablet  11  . MENEST 0.625 MG tablet TAKE ONE TABLET BY MOUTH EVERY DAY  90 each  2  . omeprazole (PRILOSEC) 40 MG capsule Daily 45 minutes prior to breakfast  90 capsule  3  . traMADol (ULTRAM) 50 MG tablet Take 1 tablet (50 mg total) by mouth 2 (two) times daily as needed for pain.  40 tablet  0  . triamterene-hydrochlorothiazide (MAXZIDE-25) 37.5-25 MG per tablet TAKE ONE TABLET BY MOUTH EVERY DAY  90 tablet  2  . naproxen (NAPROSYN) 500 MG tablet Take one po bid x 1 week then prn pain, take with food  60 tablet  0     Review of Systems No fevers/chills, weight changes, chest pain, tightness, NS, coughing, leg swelling, LOC, blood in stool or urine, abd pain, n/v/d/c, vaginal bleeding. Occasional shortness of breath, takes advair and sees Dr. Gary Fleet allergist.    Objective:   Physical Exam WDWN CF, NAD    Assessment & Plan:

## 2012-04-23 NOTE — Assessment & Plan Note (Signed)
Due to herniation to right at L5/S1 compressing S1/S2 nerve roots. W/u metastatic disease first, asked her to contact me if worsening weakness,numbness,pain.   Tramadol controlling pain adequately.

## 2012-04-23 NOTE — Patient Instructions (Signed)
Blood work today - pass by lab for this as well as urinalysis. Pass by Marion's office to schedule CT scan of entire body to find primary.

## 2012-04-23 NOTE — Assessment & Plan Note (Signed)
Discussed MRI findings, provided with copy of report. Discussed implications of metastatic cancer diagnosis. Will start with search for primary - pan CT as well as CBC with periph smear and CMP, UA today. Hopeful to direct therapy once primary found.

## 2012-04-24 ENCOUNTER — Telehealth (HOSPITAL_COMMUNITY): Payer: Self-pay

## 2012-04-24 ENCOUNTER — Telehealth: Payer: Self-pay | Admitting: Family Medicine

## 2012-04-24 ENCOUNTER — Other Ambulatory Visit: Payer: Self-pay | Admitting: Radiology

## 2012-04-24 ENCOUNTER — Other Ambulatory Visit (INDEPENDENT_AMBULATORY_CARE_PROVIDER_SITE_OTHER): Payer: BC Managed Care – PPO

## 2012-04-24 DIAGNOSIS — C801 Malignant (primary) neoplasm, unspecified: Secondary | ICD-10-CM

## 2012-04-24 DIAGNOSIS — C7952 Secondary malignant neoplasm of bone marrow: Secondary | ICD-10-CM

## 2012-04-24 DIAGNOSIS — C7951 Secondary malignant neoplasm of bone: Secondary | ICD-10-CM

## 2012-04-24 LAB — PATHOLOGIST SMEAR REVIEW

## 2012-04-24 NOTE — Telephone Encounter (Signed)
I spoke with Jennifer Pineda in regards to her apt.  She will be here at the [facility] at 630 Tuesday 04-28-12.  She had a good understanding.

## 2012-04-24 NOTE — Telephone Encounter (Signed)
Spoke with pt, discussed results of CT scans as well as recent blood work.  Pathologist review pending. I have asked her to return today for SPEP/UPEP. Will schedule bone biopsy.  Called [phone] and left message, placed order in chart.

## 2012-04-24 NOTE — Telephone Encounter (Signed)
Per Dr Sharen Hones, patient has been scheduled for Biopsy with Dr Corliss Skains on 04/28/2012 .

## 2012-04-27 ENCOUNTER — Encounter (HOSPITAL_COMMUNITY): Payer: Self-pay | Admitting: Pharmacy Technician

## 2012-04-28 ENCOUNTER — Encounter (HOSPITAL_COMMUNITY): Payer: Self-pay

## 2012-04-28 ENCOUNTER — Ambulatory Visit (HOSPITAL_COMMUNITY)
Admission: RE | Admit: 2012-04-28 | Discharge: 2012-04-28 | Disposition: A | Discharge status: Home / Self Care | Payer: BC Managed Care – PPO | Source: Ambulatory Visit | Attending: Family Medicine | Admitting: Family Medicine

## 2012-04-28 DIAGNOSIS — I10 Essential (primary) hypertension: Secondary | ICD-10-CM | POA: Insufficient documentation

## 2012-04-28 DIAGNOSIS — M545 Low back pain, unspecified: Secondary | ICD-10-CM | POA: Insufficient documentation

## 2012-04-28 DIAGNOSIS — K219 Gastro-esophageal reflux disease without esophagitis: Secondary | ICD-10-CM | POA: Insufficient documentation

## 2012-04-28 DIAGNOSIS — C7951 Secondary malignant neoplasm of bone: Secondary | ICD-10-CM

## 2012-04-28 DIAGNOSIS — R937 Abnormal findings on diagnostic imaging of other parts of musculoskeletal system: Secondary | ICD-10-CM | POA: Insufficient documentation

## 2012-04-28 LAB — CBC
HCT: 37.3 % (ref 36.0–46.0)
Hemoglobin: 12.1 g/dL (ref 12.0–15.0)
MCHC: 32.4 g/dL (ref 30.0–36.0)
MCV: 81.1 fL (ref 78.0–100.0)
RDW: 15.9 % — ABNORMAL HIGH (ref 11.5–15.5)

## 2012-04-28 MED ORDER — HYDRALAZINE HCL 20 MG/ML IJ SOLN
INTRAMUSCULAR | Status: AC
  Filled 2012-04-28: qty 1

## 2012-04-28 MED ORDER — MIDAZOLAM HCL 5 MG/5ML IJ SOLN
INTRAMUSCULAR | Status: AC | PRN
  Administered 2012-04-28 (×3): 1 mg via INTRAVENOUS
  Administered 2012-04-28: 0.5 mg via INTRAVENOUS

## 2012-04-28 MED ORDER — FENTANYL CITRATE 0.05 MG/ML IJ SOLN
INTRAMUSCULAR | Status: AC
  Filled 2012-04-28: qty 4

## 2012-04-28 MED ORDER — NALOXONE HCL 0.4 MG/ML IJ SOLN
INTRAMUSCULAR | Status: AC
  Filled 2012-04-28: qty 1

## 2012-04-28 MED ORDER — FLUMAZENIL 0.5 MG/5ML IV SOLN
INTRAVENOUS | Status: AC
  Filled 2012-04-28: qty 5

## 2012-04-28 MED ORDER — FENTANYL CITRATE 0.05 MG/ML IJ SOLN
INTRAMUSCULAR | Status: AC | PRN
  Administered 2012-04-28 (×3): 25 ug via INTRAVENOUS
  Administered 2012-04-28: 12.5 ug via INTRAVENOUS

## 2012-04-28 MED ORDER — MIDAZOLAM HCL 2 MG/2ML IJ SOLN
INTRAMUSCULAR | Status: AC
  Filled 2012-04-28: qty 4

## 2012-04-28 MED ORDER — SODIUM CHLORIDE 0.9 % IV SOLN
INTRAVENOUS | Status: DC
  Administered 2012-04-28: 07:00:00 via INTRAVENOUS

## 2012-04-28 MED ORDER — SODIUM CHLORIDE 0.9 % IV SOLN: INTRAVENOUS | Status: AC

## 2012-04-28 MED ORDER — HYDROMORPHONE HCL PF 1 MG/ML IJ SOLN
INTRAMUSCULAR | Status: AC | PRN
  Administered 2012-04-28: 1 mg

## 2012-04-28 MED ORDER — HYDROMORPHONE HCL PF 1 MG/ML IJ SOLN
INTRAMUSCULAR | Status: AC
  Filled 2012-04-28: qty 2

## 2012-04-28 NOTE — Procedures (Signed)
S/P L 3 deep core biopsy  For abnormal marrow sig on MRI LS spine

## 2012-04-28 NOTE — H&P (Signed)
Jennifer Pineda is an 61 y.o. female.   Chief Complaint: back pain x 6 months; MRI shows many spinal bony metastasis Scheduled for Lumbar #3 lesion biopsy today HPI: GERD; HTN; disc dz  Past Medical History  Diagnosis Date  . GERD (gastroesophageal reflux disease)   . Essential hypertension   . Hypercholesteremia   . Anxiety and depression   . Hematuria 03/27/2001    FH, transitional cell CA of Kidney ? work up neg (Dr. Barnie Alderman)  . Extrinsic asthma   . Allergic rhinitis   . HNP (herniated nucleus pulposus) ~2005    history per pt    Past Surgical History  Procedure Date  . Partial hysterectomy 1983    ovaries intact endometriosis  . Abd ultrasound 02/2000    normal  . Esophagogastroduodenoscopy 03/2000    hiatal hernia  . Septoplasty 10/2000    Dr. Haroldine Laws, with windows (?)  . Appendectomy 1983  . Nsvd     x2, (breech, macro)    Family History  Problem Relation Age of Onset  . Cancer Mother     kidney and lymphoma CA, neprectomy tumor of lung, non smoker  . Kidney disease Mother     T-cell cancer in kidney  . Aneurysm Father     ? in throat  . Diabetes Father   . Heart disease Father     MI  . Diabetes Sister   . Hyperlipidemia Sister   . Heart disease Brother 50    MI  . Alcohol abuse Brother     smokes again  . Hypertension Brother   . Heart disease Maternal Aunt     MI  . Cancer Brother 10    T-cell renal cancer/ removed  . Hypertension Brother    Social History:  reports that she quit smoking about 25 years ago. She has never used smokeless tobacco. She reports that she drinks about one ounce of alcohol per week. She reports that she does not use illicit drugs.  Allergies:  Allergies  Allergen Reactions  . Ampicillin   . Doxycycline Hyclate   . Levofloxacin     REACTION: Swelling of throat  . Metoprolol Succinate   . Tetracycline Hcl      (Not in a hospital admission)  Results for orders placed during the hospital encounter of  04/28/12 (from the past 48 hour(s))  APTT     Status: Normal   Collection Time   04/28/12  6:46 AM      Component Value Range Comment   aPTT 25  24 - 37 seconds   CBC     Status: Abnormal   Collection Time   04/28/12  6:46 AM      Component Value Range Comment   WBC 9.3  4.0 - 10.5 K/uL    RBC 4.60  3.87 - 5.11 MIL/uL    Hemoglobin 12.1  12.0 - 15.0 g/dL    HCT 16.1  09.6 - 04.5 %    MCV 81.1  78.0 - 100.0 fL    MCH 26.3  26.0 - 34.0 pg    MCHC 32.4  30.0 - 36.0 g/dL    RDW 40.9 (*) 81.1 - 15.5 %    Platelets 251  150 - 400 K/uL   PROTIME-INR     Status: Normal   Collection Time   04/28/12  6:46 AM      Component Value Range Comment   Prothrombin Time 12.5  11.6 - 15.2 seconds  INR 0.91  0.00 - 1.49    No results found.  Review of Systems  Constitutional: Negative for fever.  Cardiovascular: Negative for chest pain.  Gastrointestinal: Negative for nausea and vomiting.  Musculoskeletal: Positive for back pain.  Neurological: Negative for headaches.    Blood pressure 176/71, pulse 77, temperature 97.5 F (36.4 C), temperature source Oral, resp. rate 18, height 5' 3.5" (1.613 m), weight 190 lb (86.183 kg), SpO2 96.00%. Physical Exam  Constitutional: She is oriented to person, place, and time. She appears well-developed and well-nourished.  Cardiovascular: Normal rate, regular rhythm and normal heart sounds.   No murmur heard. Respiratory: Effort normal. She has no wheezes.  GI: Soft. Bowel sounds are normal. There is no tenderness.  Musculoskeletal: Normal range of motion.  Neurological: She is alert and oriented to person, place, and time.  Skin: Skin is warm.  Psychiatric: She has a normal mood and affect. Her behavior is normal. Judgment and thought content normal.     Assessment/Plan Back pain x 6 months Abn MRI Scheduled for L3 Biopsy Pt aware of procedure benefits and risks and agreeable to proceed Consent signed and in chart  Latese Dufault A 04/28/2012, 7:56  AM

## 2012-04-29 LAB — PROTEIN ELECTROPHORESIS, SERUM
Albumin ELP: 51.2 % — ABNORMAL LOW (ref 55.8–66.1)
Alpha-1-Globulin: 5.3 % — ABNORMAL HIGH (ref 2.9–4.9)
Alpha-2-Globulin: 12.6 % — ABNORMAL HIGH (ref 7.1–11.8)
Total Protein, Serum Electrophoresis: 5.7 g/dL — ABNORMAL LOW (ref 6.0–8.3)

## 2012-04-30 ENCOUNTER — Other Ambulatory Visit: Payer: Self-pay | Admitting: Family Medicine

## 2012-04-30 ENCOUNTER — Telehealth: Payer: Self-pay | Admitting: Family Medicine

## 2012-04-30 LAB — PROTEIN ELECTROPHORESIS, URINE REFLEX: Total Protein, Urine: 4 mg/dL

## 2012-04-30 NOTE — Telephone Encounter (Signed)
Patient called and LMOM to ask if you have gotten the Biopsy results from her biopsy yet? She is anxiously waiting to hear from you. Please call her as soon as you get them . Cell is best number [phone].

## 2012-04-30 NOTE — Telephone Encounter (Signed)
Discussed with patient.  Awaiting bone biopsy results.

## 2012-05-01 ENCOUNTER — Telehealth: Payer: Self-pay | Admitting: Family Medicine

## 2012-05-01 DIAGNOSIS — C7951 Secondary malignant neoplasm of bone: Secondary | ICD-10-CM

## 2012-05-01 NOTE — Telephone Encounter (Signed)
Spoke with Dr. Myna Hidalgo. ?melanoma mets, ?bone lymphoma. Will start with PET scan, consider IR vertebroplasty vs referral to NSG.  Spoke with pt - denies h/o abnormal moles or skin biopsy in past. Placed PET scan order in chart. Shirlee Limerick, there are 3 different whole body PET scan options - I think I ordered right one.

## 2012-05-01 NOTE — Telephone Encounter (Signed)
Pet Scan scheduled at [facility] on 05/05/12 at 12:30pm. Patient will bring MRI disc with her and will get the Pet Scan also put on a disc when she leaves.

## 2012-05-01 NOTE — Telephone Encounter (Signed)
Per [facility] schedulers please cancel and reorder the NM Pet Scan Skull base to thigh in Epic, please choose [facility] as the location as that is the only place that does them.

## 2012-05-01 NOTE — Telephone Encounter (Signed)
Done

## 2012-05-01 NOTE — Telephone Encounter (Signed)
Jennifer Pined[facility] Pet Scan machine is broken and they cant schedule the patient next week for the Pet Scan, and they dont know when the scanner will be fixed. WE have set up our patient to go to [facility] to have Pet done on 05/05/2012 at 1pm. Patient will obtain a copy of the Pet Scan disc for future specialist consult.

## 2012-05-05 ENCOUNTER — Ambulatory Visit: Payer: Self-pay | Admitting: Family Medicine

## 2012-05-05 ENCOUNTER — Encounter: Payer: Self-pay | Admitting: Family Medicine

## 2012-05-06 NOTE — Telephone Encounter (Signed)
Pt called has disc of Pet Scan and pt wants to know what next step will be; call pt [phone].

## 2012-05-06 NOTE — Telephone Encounter (Signed)
See result note.  

## 2012-05-08 ENCOUNTER — Encounter: Payer: Self-pay | Admitting: Family Medicine

## 2012-05-10 ENCOUNTER — Telehealth: Payer: Self-pay | Admitting: Family Medicine

## 2012-05-10 DIAGNOSIS — M5416 Radiculopathy, lumbar region: Secondary | ICD-10-CM

## 2012-05-10 DIAGNOSIS — M5126 Other intervertebral disc displacement, lumbar region: Secondary | ICD-10-CM

## 2012-05-10 NOTE — Telephone Encounter (Signed)
Message copied by Eustaquio Boyden on Sun May 10, 2012  7:26 PM ------      Message from: Arlan Organ R      Created: Fri May 08, 2012  7:28 AM       Wynona Canes:            I would think that a bone lymphoma would be very (+) on PET scan as these are aggressive lymphomas.  The fact that her scans are (-) is re-assuring.              As far as the next step is concerned, I would consider vertebroplasty as this can help prevent issues down the road.            I do have a patient that radiology "swears" has mets to the spine and yet we have NEVER been able to document cancer.  He has has this now for 10 years!!            This is a tough case, but I would think that malignancy is not the issue, particularly with a (-) PET scan.            Please call me if I can help in any other way with any patient!!            Have a blessed weekend.                  Pete E.

## 2012-05-11 NOTE — Telephone Encounter (Signed)
Spoke with pt.  Will set up with spine doctor for further evaluation of compression of R S1/2 nerve roots. Shirlee Limerick, I've placed referral letter in chart. plz send MRI, CTs, bone biopsy, and PET scan.

## 2012-05-12 NOTE — Telephone Encounter (Signed)
Appt made with Dr Verda Cumins.

## 2012-05-15 ENCOUNTER — Telehealth: Payer: Self-pay | Admitting: Family Medicine

## 2012-05-15 NOTE — Telephone Encounter (Signed)
Great.  Thanks

## 2012-05-15 NOTE — Telephone Encounter (Signed)
Patient called to let you know that she will have disc surgery on 05/28/12 at 7:30am by Dr Dutch Quint. Several Drs at [facility] reviewed her records and said that the tumor was not growing or spreading so they just want to watch it for now and not do anything except her disc surgery. She wanted you to know.

## 2012-05-21 ENCOUNTER — Encounter: Payer: Self-pay | Admitting: Family Medicine

## 2012-05-26 HISTORY — PX: LUMBAR LAMINECTOMY/DECOMPRESSION MICRODISCECTOMY: SHX5026

## 2012-07-07 ENCOUNTER — Other Ambulatory Visit: Payer: Self-pay | Admitting: Family Medicine

## 2012-07-07 ENCOUNTER — Telehealth: Payer: Self-pay | Admitting: Family Medicine

## 2012-07-07 MED ORDER — AMLODIPINE BESYLATE 10 MG PO TABS: 10.0000 mg | ORAL_TABLET | Freq: Every day | ORAL | Status: DC

## 2012-07-07 NOTE — Telephone Encounter (Signed)
Pt had back surgery for ruptured disc; feeling great, no pain. Pt wanted to know when Dr Sharen Hones wanted her to f/u with him.Please advise.

## 2012-07-07 NOTE — Telephone Encounter (Signed)
Noted.  I'm glad she's doing so well. I believe she's due for CPE.  Let's schedule this at her convenience in next few months, if can come in a few days prior fasting for blood work.

## 2012-07-07 NOTE — Telephone Encounter (Signed)
Pt request refill amlodipine to [facility] [address] x 6. Pt notified done.

## 2012-07-08 NOTE — Telephone Encounter (Signed)
Message left advising patient. Advised to cal and schedule CPE and fasting labs at her convenience.

## 2012-07-14 ENCOUNTER — Encounter: Payer: Self-pay | Admitting: Family Medicine

## 2012-07-30 ENCOUNTER — Encounter: Payer: Self-pay | Admitting: Family Medicine

## 2012-07-30 ENCOUNTER — Ambulatory Visit (INDEPENDENT_AMBULATORY_CARE_PROVIDER_SITE_OTHER): Payer: BC Managed Care – PPO | Admitting: Family Medicine

## 2012-07-30 VITALS — BP 142/74 | HR 79 | Temp 97.9°F | Wt 190.2 lb

## 2012-07-30 DIAGNOSIS — J209 Acute bronchitis, unspecified: Secondary | ICD-10-CM

## 2012-07-30 MED ORDER — ESTERIFIED ESTROGENS 0.625 MG PO TABS: 0.6250 mg | ORAL_TABLET | Freq: Every day | ORAL | Status: DC

## 2012-07-30 MED ORDER — ESTERIFIED ESTROGENS 0.3 MG PO TABS: 0.3000 mg | ORAL_TABLET | Freq: Every day | ORAL | Status: DC

## 2012-07-30 MED ORDER — HYDROCOD POLST-CHLORPHEN POLST 10-8 MG/5ML PO LQCR: 5.0000 mL | Freq: Every evening | ORAL | Status: DC | PRN

## 2012-07-30 MED ORDER — CEFDINIR 300 MG PO CAPS: 300.0000 mg | ORAL_CAPSULE | Freq: Two times a day (BID) | ORAL | Status: DC

## 2012-07-30 NOTE — Progress Notes (Signed)
  Subjective:    Patient ID: Jennifer Pineda, female    DOB: [birth year], 61 y.o.   MRN: [id number]  HPI CC: cough  61 yo with h/o asthma, GERD, former smoker, on estrogen presents with cough.  5-6 d h/o cough that is progressively getting worse.  Very deep hoarse sounding cough.  + PNDrainage and head > chest congestion.  Chest tight with cough.  No fevers/chills, abd pain, HA, ear or tooth pain.  Taking robitussin cough syrup.  H/o asthma, well controlled usually with advair.  Also takes albuterol as needed. Daughter recently with cold. No smokers at home.  Had whooping cough as child.  H/o smoking, quit about 25 yrs ago.  Requests refill of menest which she takes for hot flashes - have sent in lower dose, discussed this.  Hopeful to titrate off. Back doing well s/p surgery.  No flu shot.  No Tdap.  Past Medical History  Diagnosis Date  . GERD (gastroesophageal reflux disease)   . Essential hypertension   . Hypercholesteremia   . Anxiety and depression   . Hematuria 03/27/2001    FH, transitional cell CA of Kidney ? work up neg (Dr. Barnie Alderman)  . Extrinsic asthma   . Allergic rhinitis   . HNP (herniated nucleus pulposus) 2013    R L5-S1 disc herniation with marked radiculopathy ([place])  . Abnormal MRI, spine     w/u negative for metastatic cancer (PET negative)     Review of Systems Per HPI    Objective:   Physical Exam  Nursing note and vitals reviewed. Constitutional: She appears well-developed and well-nourished. No distress.  HENT:  Head: Normocephalic and atraumatic.  Right Ear: Hearing, tympanic membrane, external ear and ear canal normal.  Left Ear: Hearing, tympanic membrane, external ear and ear canal normal.  Nose: No mucosal edema or rhinorrhea. Right sinus exhibits maxillary sinus tenderness (mild). Right sinus exhibits no frontal sinus tenderness. Left sinus exhibits no maxillary sinus tenderness and no frontal sinus tenderness.  Mouth/Throat:  Uvula is midline, oropharynx is clear and moist and mucous membranes are normal. No oropharyngeal exudate, posterior oropharyngeal edema, posterior oropharyngeal erythema or tonsillar abscesses.  Eyes: Conjunctivae normal and EOM are normal. Pupils are equal, round, and reactive to light. No scleral icterus.  Neck: Normal range of motion. Neck supple. No JVD present.  Cardiovascular: Normal rate, regular rhythm, normal heart sounds and intact distal pulses.   No murmur heard. Pulmonary/Chest: Effort normal and breath sounds normal. No respiratory distress. She has no wheezes. She has no rales.       Clear despite deep hoarse cough present  Lymphadenopathy:    She has no cervical adenopathy.  Skin: Skin is warm and dry. No rash noted.       Assessment & Plan:

## 2012-07-30 NOTE — Patient Instructions (Addendum)
I do think you have bronchitis - treat with cefdinir or omnicef twice daily for 10 days. Push fluids and rest. Prescription for cough syrup provided. Take ibuprofen 600mg  2-3 times daily. Let me know if not improving with this.

## 2012-07-30 NOTE — Assessment & Plan Note (Signed)
Cough sounds bronchitis. See pt instructions for treatment. NSAID for inflammation, tussionex for night time. Treat with omnicef for infection - several allergies (PCN, levo, doxy).

## 2012-07-31 ENCOUNTER — Telehealth: Payer: Self-pay

## 2012-07-31 NOTE — Telephone Encounter (Signed)
Yes ok to fill.  Anticipate ampicillin allergy more of an intolerance not true allergy, but have pt monitor for sxs of allergy.

## 2012-07-31 NOTE — Telephone Encounter (Signed)
Patient notified and will have med filled.

## 2012-07-31 NOTE — Telephone Encounter (Signed)
Pt picked up med at [facility] [address] 07/30/12 and when got home found med expected except [facility] fl[facility]. [facility]. [facility] and then contact pt.

## 2012-07-31 NOTE — Telephone Encounter (Signed)
Jennifer Pineda at [address] said pt has penicillin allergy and wants to know if Dr Sharen Hones wants Omnicef filled. The flonase had been refilled by Dr Providence Lanius so that was included in pts med pick up. Pt notified and will wait to hear from Dr Timoteo Expose CMA. Pt does not know if she has taken Omnicef before.Please advise.

## 2012-08-17 ENCOUNTER — Telehealth: Payer: Self-pay | Admitting: Family Medicine

## 2012-08-17 NOTE — Telephone Encounter (Signed)
Noted. Thanks.

## 2012-08-17 NOTE — Telephone Encounter (Signed)
Patient Information:  Caller Name: Eddis  Phone: [phone]  Patient: Jennifer Pineda  Gender: Female  DOB: 1951-02-15  Age: 61 Years  PCP: Eustaquio Boyden [facility])  Office Follow Up:  Does the office need to follow up with this patient?: No  Instructions For The Office: N/A  RN Note:  No peak flow meter. Informed must be seen for antibiotics. Using Albuterol MDI once daily with short term relief.  Willing to modify home care including increase frequency of use of Albuterol, try warm clear fluids followed by honey and > humidity via 2 showers daily.  Will call if worse including wheezing, chest pain, fever, or needs Albuterol more often then every 4 hours.  Symptoms  Reason For Call & Symptoms: Called for antibiotic for recurrent cough with thick white sputum.  Reviewed Health History In EMR: Yes  Reviewed Medications In EMR: Yes  Reviewed Allergies In EMR: Yes  Reviewed Surgeries / Procedures: Yes  Date of Onset of Symptoms: 08/12/2012  Treatments Tried: Albuterol MDI daily, Mucinex, nasal saline  Treatments Tried Worked: Yes  Guideline(s) Used:  Asthma Attack  Disposition Per Guideline:   Home Care  Reason For Disposition Reached:   Mild asthma attack (e.g., no SOB at rest, mild SOB with walking, speaks normally in sentences, mild wheezing)  Advice Given:  Quick-Relief Asthma Medicine:   Start your quick-relief medicine (e.g., albuterol, salbutamol) at the first sign of any coughing or shortness of breath (don't wait for wheezing). Use your inhaler (2 puffs each time) or nebulizer every 4 hours. Continue the quick-relief medicine until you have not wheezed or coughed for 48 hours.  The best "cough medicine" for an adult with asthma is always the asthma medicine (Note: Don't use cough suppressants, but cough drops may help a tickly cough).  Drinking Liquids:  Try to drink normal amount of liquids (e.g., water). Being adequately hydrated makes it easier to  cough up the sticky lung mucus.  Humidifier:   If the air is dry, use a cool mist humidifier to prevent drying of the upper airway.  Hay Fever  : If you have nasal symptoms from hay fever, it's OK to take antihistamines (Reasons: poor control of allergic rhinitis makes asthma worse whereas antihistamines don't make asthma worse).  Long-Term-Control Asthma Medicine:  If you are using a controller medicine (e.g., inhaled steroids or cromolyn), continue to take it as directed.  Call Back If:  Inhaled asthma medicine (nebulizer or inhaler) is needed more often than every 4 hours  Wheezing has not completely cleared after 5 days  You become worse.

## 2012-08-26 HISTORY — PX: TRIGGER FINGER RELEASE: SHX641

## 2012-08-29 ENCOUNTER — Other Ambulatory Visit: Payer: Self-pay | Admitting: Family Medicine

## 2012-08-29 DIAGNOSIS — R739 Hyperglycemia, unspecified: Secondary | ICD-10-CM

## 2012-08-29 DIAGNOSIS — E7800 Pure hypercholesterolemia, unspecified: Secondary | ICD-10-CM

## 2012-08-29 DIAGNOSIS — I10 Essential (primary) hypertension: Secondary | ICD-10-CM

## 2012-08-31 ENCOUNTER — Other Ambulatory Visit (INDEPENDENT_AMBULATORY_CARE_PROVIDER_SITE_OTHER): Payer: BC Managed Care – PPO

## 2012-08-31 DIAGNOSIS — R7309 Other abnormal glucose: Secondary | ICD-10-CM

## 2012-08-31 DIAGNOSIS — E7800 Pure hypercholesterolemia, unspecified: Secondary | ICD-10-CM

## 2012-08-31 DIAGNOSIS — I10 Essential (primary) hypertension: Secondary | ICD-10-CM

## 2012-08-31 DIAGNOSIS — R739 Hyperglycemia, unspecified: Secondary | ICD-10-CM

## 2012-08-31 LAB — LIPID PANEL
HDL: 60.4 mg/dL (ref >39.00)
Total CHOL/HDL Ratio: 3
VLDL: 35.2 mg/dL (ref 0.0–40.0)

## 2012-08-31 LAB — BASIC METABOLIC PANEL
CO2: 25 mEq/L (ref 19–32)
Glucose, Bld: 143 mg/dL — ABNORMAL HIGH (ref 70–99)
Potassium: 3.5 mEq/L (ref 3.5–5.1)
Sodium: 137 mEq/L (ref 135–145)

## 2012-08-31 LAB — HEMOGLOBIN A1C: Hgb A1c MFr Bld: 6.3 % (ref 4.6–6.5)

## 2012-09-03 ENCOUNTER — Ambulatory Visit (INDEPENDENT_AMBULATORY_CARE_PROVIDER_SITE_OTHER): Payer: BC Managed Care – PPO | Admitting: Family Medicine

## 2012-09-03 ENCOUNTER — Encounter: Payer: Self-pay | Admitting: Family Medicine

## 2012-09-03 ENCOUNTER — Encounter: Payer: Self-pay | Admitting: Internal Medicine

## 2012-09-03 VITALS — BP 168/70 | HR 104 | Temp 98.2°F | Ht 64.0 in | Wt 191.5 lb

## 2012-09-03 DIAGNOSIS — Z1211 Encounter for screening for malignant neoplasm of colon: Secondary | ICD-10-CM

## 2012-09-03 DIAGNOSIS — J45909 Unspecified asthma, uncomplicated: Secondary | ICD-10-CM

## 2012-09-03 DIAGNOSIS — Z1231 Encounter for screening mammogram for malignant neoplasm of breast: Secondary | ICD-10-CM

## 2012-09-03 MED ORDER — PREDNISONE 20 MG PO TABS: ORAL_TABLET | ORAL | Status: DC

## 2012-09-03 MED ORDER — AZITHROMYCIN 250 MG PO TABS: ORAL_TABLET | ORAL | Status: DC

## 2012-09-03 NOTE — Patient Instructions (Addendum)
I think you have asthmatic bronchitis. Treat with steroid shot today, then tomorrow start steroid course.  --> decided against steroid shot. Fill zpack as well. Continue albuterol as you have been taking - 2 puffs every 4-6 hours. If not improving cough with treatment, return for xray (call us for lab visit for this). Return in 3-4 weeks for physical and cough follow up.

## 2012-09-03 NOTE — Assessment & Plan Note (Addendum)
Recent treatment with ibuprofen, omnicef, and tussionex.  Initial improvement, then over last 2 wks acute worsening. Will treat with continued albuterol, prednisone course, zpack to cover atypicals. To update [place] if not improving in 2 wks for CXR. rtc 3-4 wks for physical and f/u. Pt agrees with plan.

## 2012-09-03 NOTE — Progress Notes (Addendum)
  Subjective:    Patient ID: Jennifer Pineda, female    DOB: 10-25-1950, 62 y.o.   MRN: [id number]  HPI CC: CPE converted to acute visit for cough.  62 yo with h/o asthma, GERD, former smoker, on estrogen presents with cough. Initially seen 07/30/2012 with 5-6 d h/o cough, at that time thought tracheobronchitis and treated with omnicef, ibuprofen, tussionex which did help.  Cough initially got better, then started deteriorating again (1-2 wks ago).  Called in 2 wks ago, told to increase albuterol and drink tea with honey.  Now progressively getting worse. Very deep hoarse sounding cough. + PNDrainage and chest congestion. Chest tight with cough and wheezing and short of breath.  + diaphoresis, coughing fits.  Also having R eye pain.  No fevers/chills.  + back pain, hip pain. Granddaughter with strep throat 2 wks ago. H/o asthma, well controlled usually with advair. Also takes albuterol as needed.  Due for mammogram Would like iFOB  Past Medical History  Diagnosis Date  . GERD (gastroesophageal reflux disease)   . Essential hypertension   . Hypercholesteremia   . Anxiety and depression   . Hematuria 03/27/2001    FH, transitional cell CA of Kidney ? work up neg (Dr. Barnie Alderman)  . Extrinsic asthma   . Allergic rhinitis   . HNP (herniated nucleus pulposus) 2013    R L5-S1 disc herniation with marked radiculopathy (Pool)  . Abnormal MRI, spine     w/u negative for metastatic cancer (PET negative)    Review of Systems Per HPI    Objective:   Physical Exam  Nursing note and vitals reviewed. Constitutional: She appears well-developed and well-nourished. No distress.  HENT:  Head: Normocephalic and atraumatic.  Right Ear: Hearing, tympanic membrane, external ear and ear canal normal.  Left Ear: Hearing, tympanic membrane, external ear and ear canal normal.  Nose: No mucosal edema or rhinorrhea.  Mouth/Throat: Uvula is midline, oropharynx is clear and moist and mucous membranes  are normal. No oropharyngeal exudate, posterior oropharyngeal edema, posterior oropharyngeal erythema or tonsillar abscesses.  Eyes: Conjunctivae normal and EOM are normal. Pupils are equal, round, and reactive to light. No scleral icterus.  Neck: Normal range of motion. Neck supple.  Cardiovascular: Normal rate, regular rhythm, normal heart sounds and intact distal pulses.   No murmur heard. Pulmonary/Chest: Effort normal. No respiratory distress. She has wheezes. She has no rales.       Insp/exp wheezing, esp with forced expiration. Harsh hoarse deep cough present  Lymphadenopathy:    She has no cervical adenopathy.  Skin: Skin is warm and dry. No rash noted.      Assessment & Plan:

## 2012-09-06 ENCOUNTER — Telehealth: Payer: Self-pay | Admitting: Family Medicine

## 2012-09-06 NOTE — Telephone Encounter (Signed)
Pt got set up for colonoscopy but she told me she preferred to undergo stool kit this year - can we call and clarity?  did she take stool kit home?

## 2012-09-07 ENCOUNTER — Telehealth: Payer: Self-pay | Admitting: Family Medicine

## 2012-09-07 NOTE — Telephone Encounter (Signed)
Caller: Rakia/Patient; PCP: Eustaquio Boyden (Family Practice); CB#: [phone];  Has received message from office to call back concerning colonoscopy versus stool specimen for blood. States is going to have colonscopy done this year and has scheduled.

## 2012-09-07 NOTE — Telephone Encounter (Signed)
Message left for patient to return my call.  

## 2012-09-08 ENCOUNTER — Encounter: Payer: Self-pay | Admitting: Family Medicine

## 2012-09-08 ENCOUNTER — Telehealth: Payer: Self-pay

## 2012-09-08 ENCOUNTER — Ambulatory Visit: Payer: Self-pay | Admitting: Family Medicine

## 2012-09-08 DIAGNOSIS — Z1211 Encounter for screening for malignant neoplasm of colon: Secondary | ICD-10-CM

## 2012-09-08 NOTE — Telephone Encounter (Signed)
Noted. Thanks. Cancelled ifob.

## 2012-09-08 NOTE — Telephone Encounter (Signed)
Triage Record Num: [id number] Operator: Albertine Grates Patient Name: Jennifer Pineda Call Date & Time: 09/07/2012 5:21:28PM Patient Phone: [phone] PCP: Eustaquio Boyden Patient Gender: Female PCP Fax : [phone] Patient DOB: 1951/08/03 Practice Name[facility] Reason for Call: Caller: [name]/Patient; PCP: Eustaquio Boyden (Family Practice); CB#: [phone]; Has received message from office to call back concerning colonoscopy versus stool specimen for blood. States is going to have colonscopy done this year and has scheduled. Message sent in EPIC. Protocol(s) Used: Office Note Recommended Outcome per Protocol: Information Noted and Sent to Office Reason for Outcome: Caller information to office Care Advice: ~

## 2012-09-08 NOTE — Telephone Encounter (Signed)
See CAN note 

## 2012-09-09 ENCOUNTER — Encounter: Payer: Self-pay | Admitting: *Deleted

## 2012-09-22 ENCOUNTER — Encounter: Payer: Self-pay | Admitting: Family Medicine

## 2012-09-22 ENCOUNTER — Ambulatory Visit (INDEPENDENT_AMBULATORY_CARE_PROVIDER_SITE_OTHER)
Admission: RE | Admit: 2012-09-22 | Discharge: 2012-09-22 | Disposition: A | Discharge status: Home / Self Care | Payer: BC Managed Care – PPO | Source: Ambulatory Visit | Attending: Family Medicine | Admitting: Family Medicine

## 2012-09-22 ENCOUNTER — Telehealth: Payer: Self-pay | Admitting: Family Medicine

## 2012-09-22 ENCOUNTER — Ambulatory Visit (INDEPENDENT_AMBULATORY_CARE_PROVIDER_SITE_OTHER): Payer: BC Managed Care – PPO | Admitting: Family Medicine

## 2012-09-22 VITALS — BP 150/56 | HR 76 | Temp 98.4°F | Wt 190.0 lb

## 2012-09-22 DIAGNOSIS — R05 Cough: Secondary | ICD-10-CM

## 2012-09-22 DIAGNOSIS — R059 Cough, unspecified: Secondary | ICD-10-CM

## 2012-09-22 MED ORDER — FLUTICASONE-SALMETEROL 250-50 MCG/DOSE IN AEPB: 1.0000 | INHALATION_SPRAY | Freq: Two times a day (BID) | RESPIRATORY_TRACT | Status: DC

## 2012-09-22 MED ORDER — HYDROCOD POLST-CHLORPHEN POLST 10-8 MG/5ML PO LQCR: 5.0000 mL | Freq: Every evening | ORAL | Status: DC | PRN

## 2012-09-22 MED ORDER — METHYLPREDNISOLONE ACETATE 40 MG/ML IJ SUSP
80.0000 mg | Freq: Once | INTRAMUSCULAR | Status: AC
  Administered 2012-09-22: 80 mg via INTRAMUSCULAR

## 2012-09-22 NOTE — Patient Instructions (Addendum)
Good to see you today. I don't think there's any more infection, but I do think you have persistent reactive airways and bronchospasm. Steroid shot today - 80mg  depo medrol. Increase advair to 250/50 1 puff twice daily every day for next 2 weeks.  If cough not better with this, call me for referral to lung doctor.

## 2012-09-22 NOTE — Progress Notes (Signed)
  Subjective:    Patient ID: Jennifer Pineda, female    DOB: [birth year], 62 y.o.   MRN: [id number]  HPI CC: continued cough.  62 yo with h/o asthma, GERD, former smoker, on estrogen presents with cough.   Seen 08/07/2012 with dx tracheobronchitis, treated initially with omnicef and tussionex and albuterol.  Then seen 09/03/2012 with persistent severe deep dry cough, dx asthmatic bronchitis, treated with prednisone course, zpack, albuterol inhaler.  Prednisone course and zpack did help for 5 days, then cough returned.  Coughing productive of white sputum.  Cough worse first thing in am.  Compliant with advair 2 puffs every morning.  Using albuterol regularly.  Compliant with flonase.  Overall is feeling better.    Currently with cold as well as nose bleed.  Prior to this not having significant allergy sxs of rhinorrhea, sneezing itchy watery eyes. Reflux well controlled with omeprazole 20mg  daily.  Denies breakthrough sxs. No fevers/chills. No smokers at home. + sick contacts at work.  Past Medical History  Diagnosis Date  . GERD (gastroesophageal reflux disease)   . Essential hypertension   . Hypercholesteremia   . Anxiety and depression   . Hematuria 03/27/2001    FH, transitional cell CA of Kidney ? work up neg (Dr. Barnie [facility])  . Extrinsic asthma   . Allergic rhinitis   . HNP (herniated nucleus pulposus) 2013    R L5-S1 disc herniation with marked radiculopathy ([facility])  . Abnormal MRI, spine     w/u negative for metastatic cancer (PET negative)   Past Surgical History  Procedure Date  . Partial hysterectomy 1983    ovaries intact endometriosis  . Abd ultrasound 02/2000    normal  . Esophagogastroduodenoscopy 03/2000    hiatal hernia  . Septoplasty 10/2000    Dr. Haroldine Laws, with windows (?)  . Appendectomy 1983  . Nsvd     x2, (breech, macro)  . Laminectomy and microdiscectomy lumbar spine 05/2012    for HNP ([facility])    Review of Systems Per HPI    Objective:   Physical Exam  Nursing note and vitals reviewed. Constitutional: She appears well-developed and well-nourished. No distress.  HENT:  Head: Normocephalic and atraumatic.  Right Ear: Hearing, tympanic membrane, external ear and ear canal normal.  Left Ear: Hearing, tympanic membrane, external ear and ear canal normal.  Nose: No mucosal edema or rhinorrhea. Right sinus exhibits no maxillary sinus tenderness and no frontal sinus tenderness. Left sinus exhibits no maxillary sinus tenderness and no frontal sinus tenderness.  Mouth/Throat: Uvula is midline, oropharynx is clear and moist and mucous membranes are normal. No oropharyngeal exudate, posterior oropharyngeal edema, posterior oropharyngeal erythema or tonsillar abscesses.  Eyes: Conjunctivae normal and EOM are normal. Pupils are equal, round, and reactive to light. No scleral icterus.  Neck: Normal range of motion. Neck supple.  Cardiovascular: Normal rate, regular rhythm, normal heart sounds and intact distal pulses.   No murmur heard. Pulmonary/Chest: Effort normal and breath sounds normal. No respiratory distress. She has no wheezes. She has no rales.       Deep harsh cough present  Lymphadenopathy:    She has no cervical adenopathy.  Skin: Skin is warm and dry. No rash noted.       Assessment & Plan:

## 2012-09-22 NOTE — Assessment & Plan Note (Signed)
Severe deep dry persistent cough for almost 2 months now.   Check CXR today - clear on my read. Anticipate continued asthma and bronchospasm - will treat with steroid shot and increase advair to max dose of 250/50 1 puff bid. Treat with this for 2 wks, if not improved, refer to pulm for further treatment. Doubt allergic rhinitis related cough or GERD related cough. Tussionex for cough at night

## 2012-09-22 NOTE — Telephone Encounter (Signed)
Noted. Will see today.  

## 2012-09-22 NOTE — Telephone Encounter (Signed)
Patient calling, has new wheezing and cough that worsened on Friday 1/24.  The cough is productive, white mucous.  She has recently been dx with asthma.  Has her new inhalers but hasn't been using the Proventil as often as she can.  Teaching done on using same until cough has completely cleared.   No fever.  Triaged per Asthma protocal.  Needs to be seen today or tomorrow. Scheduled for 345p today with PCP.

## 2012-09-24 ENCOUNTER — Ambulatory Visit (INDEPENDENT_AMBULATORY_CARE_PROVIDER_SITE_OTHER): Payer: BC Managed Care – PPO | Admitting: Family Medicine

## 2012-09-24 ENCOUNTER — Encounter: Payer: Self-pay | Admitting: Family Medicine

## 2012-09-24 VITALS — BP 132/60 | HR 72 | Temp 98.3°F | Ht 65.0 in | Wt 190.0 lb

## 2012-09-24 DIAGNOSIS — I10 Essential (primary) hypertension: Secondary | ICD-10-CM

## 2012-09-24 DIAGNOSIS — E7800 Pure hypercholesterolemia, unspecified: Secondary | ICD-10-CM

## 2012-09-24 DIAGNOSIS — K219 Gastro-esophageal reflux disease without esophagitis: Secondary | ICD-10-CM

## 2012-09-24 DIAGNOSIS — F341 Dysthymic disorder: Secondary | ICD-10-CM

## 2012-09-24 DIAGNOSIS — Z0000 Encounter for general adult medical examination without abnormal findings: Secondary | ICD-10-CM | POA: Insufficient documentation

## 2012-09-24 DIAGNOSIS — R7309 Other abnormal glucose: Secondary | ICD-10-CM

## 2012-09-24 DIAGNOSIS — R05 Cough: Secondary | ICD-10-CM

## 2012-09-24 DIAGNOSIS — Z0001 Encounter for general adult medical examination with abnormal findings: Secondary | ICD-10-CM | POA: Insufficient documentation

## 2012-09-24 DIAGNOSIS — R7303 Prediabetes: Secondary | ICD-10-CM

## 2012-09-24 NOTE — Assessment & Plan Note (Signed)
Preventative protocols reviewed and updated unless pt declined. Discussed healthy diet and lifestyle.  

## 2012-09-24 NOTE — Assessment & Plan Note (Signed)
Reviewed #s with patient. Discussed concern with prediabetes, advised to monitor added sugars. Lab Results  Component Value Date   HGBA1C 6.3 08/31/2012

## 2012-09-24 NOTE — Assessment & Plan Note (Signed)
Stable on current regimen - continue. 

## 2012-09-24 NOTE — Assessment & Plan Note (Signed)
Much improved after steroid IM and increased advair dose.

## 2012-09-24 NOTE — Assessment & Plan Note (Signed)
Controlled on maxzide.

## 2012-09-24 NOTE — Assessment & Plan Note (Signed)
Reviewed #s with patient. Continue lipitor.

## 2012-09-24 NOTE — Assessment & Plan Note (Signed)
Stable on omeprazole. 

## 2012-09-24 NOTE — Progress Notes (Signed)
Subjective:    Patient ID: Jennifer Pineda, female    DOB: [birth year], 62 y.o.   MRN: [id number]  HPI CC: CPE  62 yo with h/o asthma, GERD, former smoker, on estrogen presents for CPE.  Cough improved after steroid shot and increased advair.  H/o asthma - dx last year.  Sees Dr. Gary Fleet.  Skin testing done.  Had spirometry done last year.  Preventative: Last CPE about 2 yrs ago. Colon cancer screening - upcoming colonoscopy 09/2012 Mammogram 08/2012 WNL, rec rpt 1 year Last pap was 2010, all normal.  Hysterectomy age 14yo. Td 2005 Flu - declines Pneumovax - has not had Shingles - interested will check with insurance.  Medications and allergies reviewed and updated in chart.  Past histories reviewed and updated if relevant as below. Patient Active Problem List  Diagnosis  . HYPERCHOLESTEROLEMIA  . ANXIETY DEPRESSION  . OBSTRUCTIVE SLEEP APNEA  . ESSENTIAL HYPERTENSION  . ALLERGIC RHINITIS  . GERD  . GASTRITIS  . DUODENITIS  . DERMATITIS  . ALLERGY  . Arthritis  . Hyperglycemia  . Persistent cough  . Right lumbar radiculopathy  . Abnormal MRI, spine  . Anemia  . Lumbar herniated disc   Past Medical History  Diagnosis Date  . GERD (gastroesophageal reflux disease)   . Essential hypertension   . Hypercholesteremia   . Anxiety and depression   . Hematuria 03/27/2001    FH, transitional cell CA of Kidney ? work up neg (Dr. Barnie Ald[facility])  . Extrinsic asthma   . Allergic rhinitis   . HNP (herniated nucleus pulposus) 2013    R L5-S1 disc herniation with marked radiculopathy ([facility])  . Abnormal MRI, spine     w/u negative for metastatic cancer (PET negative)   Past Surgical History  Procedure Date  . Partial hysterectomy 1983    ovaries intact endometriosis  . Abd ultrasound 02/2000    normal  . Esophagogastroduodenoscopy 03/2000    hiatal hernia  . Septoplasty 10/2000    Dr. Haroldine Laws, with windows (?)  . Appendectomy 1983  . Nsvd     x2, (breech, macro)   . Laminectomy and microdiscectomy lumbar spine 05/2012    for HNP ([facility])   History  Substance Use Topics  . Smoking status: Former Smoker -- 1.5 packs/day for 19 years    Quit date: 08/26/1986  . Smokeless tobacco: Never Used  . Alcohol Use: 1.0 oz/week    2 drink(s) per week     Comment: occassionally   Family History  Problem Relation Age of Onset  . Cancer Mother     kidney and lymphoma CA, neprectomy tumor of lung, non smoker  . Kidney disease Mother     T-cell cancer in kidney  . Aneurysm Father     ? in throat  . Diabetes Father   . Heart disease Father     MI  . Diabetes Sister   . Hyperlipidemia Sister   . Heart disease Brother 50    MI  . Alcohol abuse Brother     smokes again  . Hypertension Brother   . Heart disease Maternal Aunt     MI  . Cancer Brother 66    T-cell renal cancer/ removed  . Hypertension Brother    Allergies  Allergen Reactions  . Ampicillin     Diarrhea, stomach upset  . Doxycycline Hyclate   . Levofloxacin     REACTION: Swelling of throat, SOB  . Metoprolol Succinate   . Tetracycline  Hcl    Current Outpatient Prescriptions on File Prior to Visit  Medication Sig Dispense Refill  . albuterol (PROVENTIL HFA;VENTOLIN HFA) 108 (90 BASE) MCG/ACT inhaler Inhale 2 puffs into the lungs every 6 (six) hours as needed. For shortness of breath.      Marland Kitchen amLODipine (NORVASC) 10 MG tablet Take 1 tablet (10 mg total) by mouth at bedtime.  30 tablet  6  . atorvastatin (LIPITOR) 10 MG tablet TAKE ONE TABLET BY MOUTH AT BEDTIME  90 tablet  0  . chlorpheniramine-HYDROcodone (TUSSIONEX) 10-8 MG/5ML LQCR Take 5 mLs by mouth at bedtime as needed. Sedation precautions  140 mL  0  . esterified estrogens 0.3 MG tablet Take 1 tablet (0.3 mg total) by mouth daily.  90 tablet  1  . FLUoxetine (PROZAC) 20 MG capsule TAKE ONE CAPSULE BY MOUTH EVERY DAY  90 capsule  0  . fluticasone (FLONASE) 50 MCG/ACT nasal spray Place 2 sprays into the nose daily.      .  Fluticasone-Salmeterol (ADVAIR DISKUS) 250-50 MCG/DOSE AEPB Inhale 1 puff into the lungs 2 (two) times daily.  1 each  6  . guaifenesin (HUMIBID E) 400 MG TABS Take 400 mg by mouth daily.      Marland Kitchen loratadine (CLARITIN) 10 MG tablet Take 10 mg by mouth daily as needed. For allergies.      Marland Kitchen omeprazole (PRILOSEC) 20 MG capsule Take 40 mg by mouth daily.      . traMADol (ULTRAM) 50 MG tablet Take 50 mg by mouth 2 (two) times daily as needed. For pain.      Marland Kitchen triamterene-hydrochlorothiazide (MAXZIDE-25) 37.5-25 MG per tablet TAKE ONE TABLET BY MOUTH EVERY DAY  90 tablet  1     Review of Systems  Constitutional: Negative for fever, chills, activity change, appetite change, fatigue and unexpected weight change.  HENT: Negative for hearing loss and neck pain.   Eyes: Negative for visual disturbance.  Respiratory: Positive for cough, shortness of breath and wheezing. Negative for chest tightness.   Cardiovascular: Negative for chest pain, palpitations and leg swelling.  Gastrointestinal: Negative for nausea, vomiting, abdominal pain, diarrhea, constipation, blood in stool and abdominal distention.  Genitourinary: Negative for hematuria and difficulty urinating.  Musculoskeletal: Negative for myalgias and arthralgias.  Skin: Negative for rash.  Neurological: Negative for dizziness, seizures, syncope and headaches.  Hematological: Does not bruise/bleed easily.  Psychiatric/Behavioral: Negative for dysphoric mood. The patient is not nervous/anxious.        Objective:   Physical Exam  Nursing note and vitals reviewed. Constitutional: She is oriented to person, place, and time. She appears well-developed and well-nourished. No distress.  HENT:  Head: Normocephalic and atraumatic.  Right Ear: External ear normal.  Left Ear: External ear normal.  Nose: Nose normal.  Mouth/Throat: Oropharynx is clear and moist. No oropharyngeal exudate.  Eyes: Conjunctivae normal and EOM are normal. Pupils are equal,  round, and reactive to light. No scleral icterus.  Neck: Normal range of motion. Neck supple. No thyromegaly present.  Cardiovascular: Normal rate, regular rhythm, normal heart sounds and intact distal pulses.   No murmur heard. Pulses:      Radial pulses are 2+ on the right side, and 2+ on the left side.  Pulmonary/Chest: Effort normal and breath sounds normal. No respiratory distress. She has no wheezes. She has no rales. Right breast exhibits no inverted nipple, no mass, no nipple discharge, no skin change and no tenderness. Left breast exhibits no inverted nipple, no mass,  no nipple discharge, no skin change and no tenderness. Breasts are symmetrical.  Abdominal: Soft. Bowel sounds are normal. She exhibits no distension and no mass. There is no tenderness. There is no rebound and no guarding.  Genitourinary:       deferred  Musculoskeletal: Normal range of motion. She exhibits no edema.  Lymphadenopathy:    She has no cervical adenopathy.    She has no axillary adenopathy.       Right axillary: No lateral adenopathy present.       Left axillary: No lateral adenopathy present.      Right: No supraclavicular adenopathy present.       Left: No supraclavicular adenopathy present.  Neurological: She is alert and oriented to person, place, and time.       CN grossly intact, station and gait intact  Skin: Skin is warm and dry. No rash noted.  Psychiatric: She has a normal mood and affect. Her behavior is normal. Judgment and thought content normal.       Assessment & Plan:

## 2012-09-24 NOTE — Patient Instructions (Addendum)
Check with Dr. Gary Fleet about pneumovax (pneumonia shot). Call your insurance about the shingles shot to see if it is covered or how much it would cost and where is cheaper (here or pharmacy).  If you want to receive here, call for nurse visit. Good to see you today, call us with questions. Consider flu shot next year.

## 2012-09-26 HISTORY — PX: COLONOSCOPY: SHX174

## 2012-09-30 ENCOUNTER — Ambulatory Visit (AMBULATORY_SURGERY_CENTER): Payer: BC Managed Care – PPO | Admitting: *Deleted

## 2012-09-30 ENCOUNTER — Encounter: Payer: Self-pay | Admitting: Internal Medicine

## 2012-09-30 VITALS — Ht 64.0 in | Wt 190.0 lb

## 2012-09-30 DIAGNOSIS — Z1211 Encounter for screening for malignant neoplasm of colon: Secondary | ICD-10-CM

## 2012-09-30 MED ORDER — NA SULFATE-K SULFATE-MG SULF 17.5-3.13-1.6 GM/177ML PO SOLN: 1.0000 | Freq: Once | ORAL | Status: DC

## 2012-10-13 ENCOUNTER — Other Ambulatory Visit: Payer: Self-pay | Admitting: Family Medicine

## 2012-10-14 ENCOUNTER — Encounter: Payer: Self-pay | Admitting: Internal Medicine

## 2012-10-14 ENCOUNTER — Ambulatory Visit (AMBULATORY_SURGERY_CENTER): Payer: BC Managed Care – PPO | Admitting: Internal Medicine

## 2012-10-14 VITALS — BP 156/59 | HR 65 | Temp 98.5°F | Resp 16 | Ht 64.0 in | Wt 190.0 lb

## 2012-10-14 DIAGNOSIS — Z1211 Encounter for screening for malignant neoplasm of colon: Secondary | ICD-10-CM

## 2012-10-14 MED ORDER — SODIUM CHLORIDE 0.9 % IV SOLN: 500.0000 mL | INTRAVENOUS | Status: DC

## 2012-10-14 NOTE — Op Note (Addendum)
[facility] [address] [place], [address]   COLONOSCOPY PROCEDURE REPORT  PATIENT: Jennifer Pineda, Jennifer Pineda  MR#: [id number] BIRTHDATE: 28-Nov-1950 , 61  yrs. old GENDER: Female ENDOSCOPIST: Iva Boop, MD, [facility] REFERRED [id number] Sharen Hones, M.D. PROCEDURE DATE:  10/14/2012 PROCEDURE:   Colonoscopy, screening ASA CLASS:   Class II INDICATIONS:average risk screening. MEDICATIONS: propofol (Diprivan) 400mg  IV, MAC sedation, administered by CRNA, and These medications were titrated to patient response per physician's verbal order  DESCRIPTION OF PROCEDURE:   After the risks benefits and alternatives of the procedure were thoroughly explained, informed consent was obtained.  A digital rectal exam revealed no abnormalities of the rectum.   The LB [id number]-[id number]  endoscope was introduced through the anus and advanced to the cecum, which was identified by both the appendix and ileocecal valve. No adverse events experienced.   The quality of the prep was Suprep excellent The instrument was then slowly withdrawn as the colon was fully examined.      COLON FINDINGS: Small internal hemorrhoids were found.   The colon mucosa was otherwise normal.   A right colon retroflexion was performed and was normal. Retroflexed rectal views revealed internal hemorrhoids. The time to cecum=3 minutes 46 seconds. Withdrawal time=8 minutes 25 seconds.  The scope was withdrawn and the procedure completed. COMPLICATIONS: There were no complications.  ENDOSCOPIC IMPRESSION: 1.   Small internal hemorrhoids 2.   The colon mucosa was otherwise normal with excellent prep  RECOMMENDATIONS: Repeat Colonscopy in 10 years.   eSigned:  Iva Boop, MD, [facility] 10/14/2012 9:23 AM Revised: 10/14/2012 9:23 AM  cc: Eustaquio Boyden MD and The Patient

## 2012-10-14 NOTE — Progress Notes (Signed)
Patient did not experience any of the following events: a burn prior to discharge; a fall within the facility; wrong site/side/patient/procedure/implant event; or a hospital transfer or hospital admission upon discharge from the facility. (G8907) Patient did not have preoperative order for IV antibiotic SSI prophylaxis. (G8918)  

## 2012-10-14 NOTE — Patient Instructions (Addendum)
The colonoscopy did not reveal any polyps or cancer.  You do have some hemorrhoids - not usually a problem.  Next routine colonoscopy in 10 years - 2024.  Thank you for choosing me and [facility].  Iva Boop, MD, FACG YOU HAD AN ENDOSCOPIC PROCEDURE TODAY AT THE  ENDOSCOPY CENTER: Refer to the procedure report that was given to you for any specific questions about what was found during the examination.  If the procedure report does not answer your questions, please call your gastroenterologist to clarify.  If you requested that your care partner not be given the details of your procedure findings, then the procedure report has been included in a sealed envelope for you to review at your convenience later.  YOU SHOULD EXPECT: Some feelings of bloating in the abdomen. Passage of more gas than usual.  Walking can help get rid of the air that was put into your GI tract during the procedure and reduce the bloating. If you had a lower endoscopy (such as a colonoscopy or flexible sigmoidoscopy) you may notice spotting of blood in your stool or on the toilet paper. If you underwent a bowel prep for your procedure, then you may not have a normal bowel movement for a few days.  DIET: Your first meal following the procedure should be a light meal and then it is ok to progress to your normal diet.  A half-sandwich or bowl of soup is an example of a good first meal.  Heavy or fried foods are harder to digest and may make you feel nauseous or bloated.  Likewise meals heavy in dairy and vegetables can cause extra gas to form and this can also increase the bloating.  Drink plenty of fluids but you should avoid alcoholic beverages for 24 hours.  ACTIVITY: Your care partner should take you home directly after the procedure.  You should plan to take it easy, moving slowly for the rest of the day.  You can resume normal activity the day after the procedure however you should NOT DRIVE or use  heavy machinery for 24 hours (because of the sedation medicines used during the test).    SYMPTOMS TO REPORT IMMEDIATELY: A gastroenterologist can be reached at any hour.  During normal business hours, 8:30 AM to 5:00 PM Monday through Friday, call [phone].  After hours and on weekends, please call the GI answering service at [phone] who will take a message and have the physician on call contact you.   Following lower endoscopy (colonoscopy or flexible sigmoidoscopy):  Excessive amounts of blood in the stool  Significant tenderness or worsening of abdominal pains  Swelling of the abdomen that is new, acute  Fever of 100F or higher  Following upper endoscopy (EGD)   FOLLOW UP: If any biopsies were taken you will be contacted by phone or by letter within the next 1-3 weeks.  Call your gastroenterologist if you have not heard about the biopsies in 3 weeks.  Our staff will call the home number listed on your records the next business day following your procedure to check on you and address any questions or concerns that you may have at that time regarding the information given to you following your procedure. This is a courtesy call and so if there is no answer at the home number and we have not heard from you through the emergency physician on call, we will assume that you have returned to your regular daily activities without incident.  SIGNATURES/CONFIDENTIALITY: You and/or your care partner have signed paperwork which will be entered into your electronic medical record.  These signatures attest to the fact that that the information above on your After Visit Summary has been reviewed and is understood.  Full responsibility of the confidentiality of this discharge information lies with you and/or your care-partner.   Hemorrhoid information given.  Repeat in 10 years.

## 2012-10-15 ENCOUNTER — Telehealth: Payer: Self-pay | Admitting: *Deleted

## 2012-10-15 NOTE — Telephone Encounter (Signed)
Left message on number given in admitting yesterday to call back if any problems or concerns. ewm

## 2012-10-20 ENCOUNTER — Encounter: Payer: Self-pay | Admitting: Family Medicine

## 2012-10-23 ENCOUNTER — Other Ambulatory Visit: Payer: Self-pay | Admitting: Family Medicine

## 2013-03-11 ENCOUNTER — Other Ambulatory Visit: Payer: Self-pay | Admitting: Family Medicine

## 2013-03-11 NOTE — Telephone Encounter (Signed)
OK to refill? Not on current med list 

## 2013-03-13 MED ORDER — ESTERIFIED ESTROGENS 0.3 MG PO TABS: 0.3000 mg | ORAL_TABLET | Freq: Every day | ORAL | Status: DC

## 2013-03-17 ENCOUNTER — Other Ambulatory Visit: Payer: Self-pay | Admitting: Family Medicine

## 2013-03-18 ENCOUNTER — Ambulatory Visit (INDEPENDENT_AMBULATORY_CARE_PROVIDER_SITE_OTHER)
Admission: RE | Admit: 2013-03-18 | Discharge: 2013-03-18 | Disposition: A | Discharge status: Home / Self Care | Payer: BC Managed Care – PPO | Source: Ambulatory Visit | Attending: Family Medicine | Admitting: Family Medicine

## 2013-03-18 ENCOUNTER — Encounter: Payer: Self-pay | Admitting: Family Medicine

## 2013-03-18 ENCOUNTER — Ambulatory Visit (INDEPENDENT_AMBULATORY_CARE_PROVIDER_SITE_OTHER): Payer: BC Managed Care – PPO | Admitting: Family Medicine

## 2013-03-18 VITALS — BP 144/72 | HR 72 | Temp 98.1°F | Wt 191.5 lb

## 2013-03-18 DIAGNOSIS — M545 Low back pain: Secondary | ICD-10-CM

## 2013-03-18 MED ORDER — CYCLOBENZAPRINE HCL 10 MG PO TABS: 5.0000 mg | ORAL_TABLET | Freq: Two times a day (BID) | ORAL | Status: DC | PRN

## 2013-03-18 NOTE — Patient Instructions (Signed)
xrays today - will call you with results. I may have you return for further blood work but want to obtain xrays first. I wonder if you have a muscle strain of right lumbar muscles.  Treat with flexeril 1/2 to 1 tablet as needed (may make you sleepy). Let us know if not better

## 2013-03-18 NOTE — Progress Notes (Signed)
  Subjective:    Patient ID: Jennifer Pineda, female    DOB: 1950-11-25, 62 y.o.   MRN: [id number]  HPI CC: back pain  Feeling nagging pain in lower back intermittently for the last 1 month.  No shooting pain, no radiation, no leg weakness or numbness.  No fevers/chills recently.  No weight loss.  Pain lasts less than 1 hour, feels like pressure sensation. Denies inciting trauma/injury.  Pain not elicited with twisting of spine.  Wt Readings from Last 3 Encounters:  03/18/13 191 lb 8 oz (86.864 kg)  10/14/12 190 lb (86.183 kg)  09/30/12 190 lb (86.183 kg)    H/o abnormal MRI of spine - concerning for metastatic disease, however workup was normal (normal pan CT scan, overall normal PET scan, normal deep core bone biopsy at L3). Underwent laminectomy and microdiscectomy by Dr. Jordan Likes for HNP with resolution of back pain 05/2012  Past Medical History  Diagnosis Date  . GERD (gastroesophageal reflux disease)   . Essential hypertension   . Hypercholesteremia   . Anxiety and depression   . Hematuria 03/27/2001    FH, transitional cell CA of Kidney ? work up neg (Dr. Barnie Alderman)  . Extrinsic asthma   . Allergic rhinitis   . HNP (herniated nucleus pulposus) 2013    R L5-S1 disc herniation with marked radiculopathy ([facility])  . Abnormal MRI, spine     w/u negative for metastatic cancer (PET negative)     Review of Systems Per HPI    Objective:   Physical Exam  Nursing note and vitals reviewed. Constitutional: She appears well-developed and well-nourished. No distress.  Abdominal: Soft. Bowel sounds are normal. She exhibits no distension and no mass. There is no hepatosplenomegaly. There is no tenderness. There is no rebound and no guarding.  Musculoskeletal: She exhibits no edema.  No midline spine tenderness. Tender to palpation L upper lumbar paraspinous mm Neg SLR bilaterally  Neurological: She has normal strength. No sensory deficit.  5/5 strength BLE       Assessment &  Plan:

## 2013-03-19 ENCOUNTER — Encounter: Payer: Self-pay | Admitting: Family Medicine

## 2013-03-19 DIAGNOSIS — M545 Low back pain: Secondary | ICD-10-CM | POA: Insufficient documentation

## 2013-03-19 MED ORDER — NAPROXEN 500 MG PO TABS: ORAL_TABLET | ORAL | Status: DC

## 2013-03-19 NOTE — Assessment & Plan Note (Signed)
Exam reassuring, no red flags on story or on physical. Anticipate lumbar strain. However given h/o abnormal finding on prior spine imaging, I will repeat lumbar spine films to eval for change from prio. If abnormal, will recommend further evaluation with bone metabolism studies and SPEP/UPEP. Treat for now with muscle relaxant and short course of NSAID If worsening, to return for further evaluation. Pt agrees with plan.

## 2013-05-12 ENCOUNTER — Other Ambulatory Visit: Payer: Self-pay | Admitting: Family Medicine

## 2013-07-07 ENCOUNTER — Other Ambulatory Visit: Payer: Self-pay | Admitting: Family Medicine

## 2013-10-12 ENCOUNTER — Other Ambulatory Visit: Payer: Self-pay | Admitting: Family Medicine

## 2013-10-25 ENCOUNTER — Telehealth: Payer: Self-pay | Admitting: Family Medicine

## 2013-10-25 NOTE — Telephone Encounter (Signed)
Patient Information:  Caller Name: Marchelle  Phone: [phone]  Patient: Jennifer Pineda  Gender: Female  DOB: [birth year]  Age: 63 Years  PCP: Ria Bush [facility])  Office Follow Up:  Does the office need to follow up with this patient?: No  Instructions For The Office: N/A  RN Note:  Offered appt for today but states that she is starting to feel better and able to hold the fluids down so she prefers to wait a while and see how she does; will call back if sx worsen  Symptoms  Reason For Call & Symptoms: Pt is calling and states that she is having vomiting and diarrhea;  diarrhea  x 7 since midnight;  vomting x 7 since midnight;  diarrhea is very watery and vomiting yellow stomach juices now;  holding approx a cup of water dow now; temp 99.0; seems like diarrhea is worse  Reviewed Health History In EMR: Yes  Reviewed Medications In EMR: Yes  Reviewed Allergies In EMR: Yes  Reviewed Surgeries / Procedures: Yes  Date of Onset of Symptoms: 10/24/2013  Guideline(s) Used:  Diarrhea  Disposition Per Guideline:   Go to Office Now  Reason For Disposition Reached:   Age > 60 years and has had > 6 diarrhea stools in past 24 hours  Advice Given:  Fluids:  Drink more fluids, at least 8-10 glasses (8 oz or 240 ml) daily.  For example: sports drinks, diluted fruit juices, soft drinks.  Supplement this with saltine crackers or soups to make certain that you are getting sufficient fluid and salt to meet your body's needs.  Avoid caffeinated beverages (Reason: caffeine is mildly dehydrating).  Nutrition:  Other acceptable foods include: bananas, yogurt, crackers, soup.  Diarrhea Medication - Bismuth Subsalicylate (e.g., Kaopectate, PeptoBismol):  Adult dosage: 2 tablets or 2 tablespoons (30 ml) by mouth every hour if diarrhea continues to a maximum of 8 doses in a 24-hour period.  Do not use for more than 2 days  Expected Course:  Viral diarrhea lasts 4-7 days. Always worse  on days 1 and 2.  Call Back If:  Signs of dehydration occur (e.g., no urine for more than 12 hours, very dry mouth, lightheaded, etc.)  Diarrhea lasts over 7 days  You become worse.  Patient Refused Recommendation:  Patient Will Follow Up With Office Later  Pt will call back if sx worsen

## 2013-10-25 NOTE — Telephone Encounter (Signed)
Noted, thanks!

## 2014-01-10 ENCOUNTER — Other Ambulatory Visit: Payer: Self-pay | Admitting: Family Medicine

## 2014-01-25 ENCOUNTER — Other Ambulatory Visit: Payer: Self-pay | Admitting: Family Medicine

## 2014-02-14 ENCOUNTER — Other Ambulatory Visit: Payer: Self-pay | Admitting: Family Medicine

## 2014-04-30 ENCOUNTER — Other Ambulatory Visit: Payer: Self-pay | Admitting: Family Medicine

## 2014-04-30 DIAGNOSIS — D649 Anemia, unspecified: Secondary | ICD-10-CM

## 2014-04-30 DIAGNOSIS — R7303 Prediabetes: Secondary | ICD-10-CM

## 2014-04-30 DIAGNOSIS — I10 Essential (primary) hypertension: Secondary | ICD-10-CM

## 2014-04-30 DIAGNOSIS — E7800 Pure hypercholesterolemia, unspecified: Secondary | ICD-10-CM

## 2014-05-06 ENCOUNTER — Other Ambulatory Visit (INDEPENDENT_AMBULATORY_CARE_PROVIDER_SITE_OTHER): Payer: Commercial Managed Care - HMO

## 2014-05-06 DIAGNOSIS — D649 Anemia, unspecified: Secondary | ICD-10-CM

## 2014-05-06 DIAGNOSIS — I10 Essential (primary) hypertension: Secondary | ICD-10-CM

## 2014-05-06 DIAGNOSIS — R7303 Prediabetes: Secondary | ICD-10-CM

## 2014-05-06 DIAGNOSIS — E7800 Pure hypercholesterolemia, unspecified: Secondary | ICD-10-CM

## 2014-05-06 DIAGNOSIS — R7309 Other abnormal glucose: Secondary | ICD-10-CM

## 2014-05-06 LAB — LIPID PANEL
Cholesterol: 178 mg/dL (ref 0–200)
HDL: 51.2 mg/dL (ref >39.00)
LDL Cholesterol: 102 mg/dL — ABNORMAL HIGH (ref 0–99)
NonHDL: 126.8
Total CHOL/HDL Ratio: 3
Triglycerides: 122 mg/dL (ref 0.0–149.0)
VLDL: 24.4 mg/dL (ref 0.0–40.0)

## 2014-05-06 LAB — CBC WITH DIFFERENTIAL/PLATELET
BASOS ABS: 0 10*3/uL (ref 0.0–0.1)
Basophils Relative: 0.6 % (ref 0.0–3.0)
Eosinophils Absolute: 0.3 10*3/uL (ref 0.0–0.7)
Eosinophils Relative: 3.4 % (ref 0.0–5.0)
HEMATOCRIT: 39 % (ref 36.0–46.0)
HEMOGLOBIN: 12.9 g/dL (ref 12.0–15.0)
LYMPHS ABS: 2.2 10*3/uL (ref 0.7–4.0)
Lymphocytes Relative: 27 % (ref 12.0–46.0)
MCHC: 33.2 g/dL (ref 30.0–36.0)
MCV: 85.7 fl (ref 78.0–100.0)
MONOS PCT: 7 % (ref 3.0–12.0)
Monocytes Absolute: 0.6 10*3/uL (ref 0.1–1.0)
NEUTROS ABS: 5 10*3/uL (ref 1.4–7.7)
Neutrophils Relative %: 62 % (ref 43.0–77.0)
Platelets: 277 10*3/uL (ref 150.0–400.0)
RBC: 4.55 Mil/uL (ref 3.87–5.11)
RDW: 14.6 % (ref 11.5–15.5)
WBC: 8 10*3/uL (ref 4.0–10.5)

## 2014-05-06 LAB — COMPREHENSIVE METABOLIC PANEL
ALK PHOS: 93 U/L (ref 39–117)
ALT: 33 U/L (ref 0–35)
AST: 33 U/L (ref 0–37)
Albumin: 3.7 g/dL (ref 3.5–5.2)
BILIRUBIN TOTAL: 0.8 mg/dL (ref 0.2–1.2)
BUN: 12 mg/dL (ref 6–23)
CO2: 26 mEq/L (ref 19–32)
CREATININE: 0.8 mg/dL (ref 0.4–1.2)
Calcium: 9.2 mg/dL (ref 8.4–10.5)
Chloride: 100 mEq/L (ref 96–112)
GFR: 75.41 mL/min (ref >60.00)
Glucose, Bld: 132 mg/dL — ABNORMAL HIGH (ref 70–99)
Potassium: 3.5 mEq/L (ref 3.5–5.1)
Sodium: 137 mEq/L (ref 135–145)
Total Protein: 7.6 g/dL (ref 6.0–8.3)

## 2014-05-06 LAB — HEMOGLOBIN A1C: HEMOGLOBIN A1C: 6.4 % (ref 4.6–6.5)

## 2014-05-11 ENCOUNTER — Encounter: Payer: Self-pay | Admitting: Family Medicine

## 2014-05-11 ENCOUNTER — Encounter (INDEPENDENT_AMBULATORY_CARE_PROVIDER_SITE_OTHER): Payer: Self-pay

## 2014-05-11 ENCOUNTER — Ambulatory Visit (INDEPENDENT_AMBULATORY_CARE_PROVIDER_SITE_OTHER): Payer: Commercial Managed Care - HMO | Admitting: Family Medicine

## 2014-05-11 VITALS — BP 140/68 | HR 62 | Temp 98.2°F | Ht 63.5 in | Wt 187.0 lb

## 2014-05-11 DIAGNOSIS — J45909 Unspecified asthma, uncomplicated: Secondary | ICD-10-CM

## 2014-05-11 DIAGNOSIS — R937 Abnormal findings on diagnostic imaging of other parts of musculoskeletal system: Secondary | ICD-10-CM

## 2014-05-11 DIAGNOSIS — Z23 Encounter for immunization: Secondary | ICD-10-CM

## 2014-05-11 DIAGNOSIS — R7309 Other abnormal glucose: Secondary | ICD-10-CM

## 2014-05-11 DIAGNOSIS — R7303 Prediabetes: Secondary | ICD-10-CM

## 2014-05-11 DIAGNOSIS — Z0000 Encounter for general adult medical examination without abnormal findings: Secondary | ICD-10-CM | POA: Insufficient documentation

## 2014-05-11 DIAGNOSIS — I10 Essential (primary) hypertension: Secondary | ICD-10-CM

## 2014-05-11 DIAGNOSIS — K219 Gastro-esophageal reflux disease without esophagitis: Secondary | ICD-10-CM

## 2014-05-11 DIAGNOSIS — J309 Allergic rhinitis, unspecified: Secondary | ICD-10-CM

## 2014-05-11 DIAGNOSIS — F341 Dysthymic disorder: Secondary | ICD-10-CM

## 2014-05-11 DIAGNOSIS — E7800 Pure hypercholesterolemia, unspecified: Secondary | ICD-10-CM

## 2014-05-11 DIAGNOSIS — J019 Acute sinusitis, unspecified: Secondary | ICD-10-CM | POA: Insufficient documentation

## 2014-05-11 DIAGNOSIS — E2839 Other primary ovarian failure: Secondary | ICD-10-CM

## 2014-05-11 DIAGNOSIS — M6530 Trigger finger, unspecified finger: Secondary | ICD-10-CM | POA: Insufficient documentation

## 2014-05-11 DIAGNOSIS — J4520 Mild intermittent asthma, uncomplicated: Secondary | ICD-10-CM

## 2014-05-11 MED ORDER — ALBUTEROL SULFATE HFA 108 (90 BASE) MCG/ACT IN AERS: 2.0000 | INHALATION_SPRAY | Freq: Four times a day (QID) | RESPIRATORY_TRACT | Status: DC | PRN

## 2014-05-11 MED ORDER — FLUTICASONE PROPIONATE 50 MCG/ACT NA SUSP: 2.0000 | Freq: Every day | NASAL | Status: DC

## 2014-05-11 MED ORDER — ATORVASTATIN CALCIUM 10 MG PO TABS: ORAL_TABLET | ORAL | Status: DC

## 2014-05-11 MED ORDER — TRIAMTERENE-HCTZ 37.5-25 MG PO TABS: ORAL_TABLET | ORAL | Status: DC

## 2014-05-11 MED ORDER — ESTERIFIED ESTROGENS 1.25 MG PO TABS: 1.0000 | ORAL_TABLET | Freq: Every day | ORAL | Status: DC

## 2014-05-11 MED ORDER — FLUOXETINE HCL 10 MG PO CAPS: ORAL_CAPSULE | ORAL | Status: DC

## 2014-05-11 MED ORDER — FLUOXETINE HCL 20 MG PO CAPS: ORAL_CAPSULE | ORAL | Status: DC

## 2014-05-11 MED ORDER — AZITHROMYCIN 250 MG PO TABS: ORAL_TABLET | ORAL | Status: DC

## 2014-05-11 MED ORDER — OMEPRAZOLE 40 MG PO CPDR: DELAYED_RELEASE_CAPSULE | ORAL | Status: DC

## 2014-05-11 MED ORDER — AMLODIPINE BESYLATE 10 MG PO TABS: 10.0000 mg | ORAL_TABLET | Freq: Every day | ORAL | Status: DC

## 2014-05-11 NOTE — Addendum Note (Signed)
Addended by: Lurlean Nanny on: 05/11/2014 02:29 PM   Modules accepted: Orders

## 2014-05-11 NOTE — Assessment & Plan Note (Signed)
Referred to hand per pt request.

## 2014-05-11 NOTE — Assessment & Plan Note (Signed)
Stable on daily PPI - does not want to lower dose

## 2014-05-11 NOTE — Assessment & Plan Note (Signed)
Treat acute sinusitis with zpack. Multiple abx allergies/intolerances

## 2014-05-11 NOTE — Assessment & Plan Note (Signed)
Will check with radiology regarding optimal f/u plan including imaging modality.

## 2014-05-11 NOTE — Patient Instructions (Addendum)
Tdap and prevnar today. Call your insurance about the shingles shot to see if it is covered or how much it would cost and where is cheaper (here or pharmacy).  If you want to receive here, call for nurse visit. Hearing screen today. Call [facility] to schedule mammogram. We will also set you up for bone density scan hopefully on same day. Packet provided for advanced directive. Let's try lower prozac and menest doses (sent to pharmacy). Good to see you today, call us with questions.

## 2014-05-11 NOTE — Progress Notes (Addendum)
BP 140/68  Pulse 62  Temp(Src) 98.2 F (36.8 C) (Oral)  Ht 5' 3.5" (1.613 m)  Wt 187 lb (84.823 kg)  BMI 32.60 kg/m2  SpO2 98%   CC: welcome to medicare Subjective:    Patient ID: Jennifer Pineda, female    DOB: 1951/04/11, 63 y.o.   MRN: [id number]  HPI: Jennifer Pineda is a 63 y.o. female presenting on 05/11/2014 for No chief complaint on file.   63 yo with h/o asthma, GERD, former smoker, on estrogen presents for CPE.  Sinus issues - over last 1.5 months worsening sinus congestion with productive mucous. + PNdrainage. + chills, headaches, facial pressure pain.  No fevers, ear pain or tooth pain. + coughing and sneezing. Takes flonase, guaifenesin, claritin, advair.   Requests referral to Dr. Burney Gauze for L ring finger trigger finger. H/o R trigger release.  H/o asthma - dx last 2013 - sees Dr. Remus Blake. Skin testing done. Had spirometry done last year as well. HNP (herniated nucleus pulposus) Date: 2013 R L5-S1 disc herniation with marked radiculopathy ([facility]) s/p laminectomy  Abnormal MRI, spine Date: 2013 w/u negative for metastatic cancer (PET, pan CT, bone biopsy negative)  Passes hearing and vision screens Denies falls in last year.  Denies depression/anhedonia, sadness. Agrees to trial of lower prozac dose as doing very stable longterm on 20mg .  Preventative: COLONOSCOPY Date: 09/2012 small int hem Carlean Purl) rpt 10 yrs Mammogram 08/2012 WNL, needs to reschedule.  Last pap was 2010, all normal. Hysterectomy age 60yo. Declines pelvic exam. DEXA - would like scheduled. Td 2005, Tdap today Flu - declines  prevnar today.  Shingles - interested will check with insurance. Advanced directives - would like packet. Would want son [name]) to be HCPOA.  Divorced Daughter [name], lives with her Children: 2 children // one daughter lives with mother/ son lives with father  Relevant past medical, surgical, family and social history reviewed and updated as  indicated.  Allergies and medications reviewed and updated. Current Outpatient Prescriptions on File Prior to Visit  Medication Sig  . Fluticasone-Salmeterol (ADVAIR DISKUS) 250-50 MCG/DOSE AEPB Inhale 1 puff into the lungs 2 (two) times daily.  Marland Kitchen guaifenesin (HUMIBID E) 400 MG TABS Take 400 mg by mouth daily.  Marland Kitchen loratadine (CLARITIN) 10 MG tablet TAKE ONE TABLET BY MOUTH EVERY DAY   No current facility-administered medications on file prior to visit.    Review of Systems  Constitutional: Positive for chills. Negative for fever, activity change, appetite change, fatigue and unexpected weight change.  HENT: Positive for congestion, postnasal drip, rhinorrhea, sinus pressure and sneezing. Negative for hearing loss.   Eyes: Negative for visual disturbance.  Respiratory: Negative for cough, chest tightness, shortness of breath and wheezing.   Cardiovascular: Negative for chest pain, palpitations and leg swelling.  Gastrointestinal: Negative for nausea, vomiting, abdominal pain, diarrhea, constipation, blood in stool and abdominal distention.  Genitourinary: Negative for hematuria and difficulty urinating.  Musculoskeletal: Negative for arthralgias, myalgias and neck pain.  Skin: Negative for rash.  Neurological: Positive for headaches. Negative for dizziness, seizures and syncope.  Hematological: Negative for adenopathy. Does not bruise/bleed easily.  Psychiatric/Behavioral: Negative for dysphoric mood. The patient is not nervous/anxious.    Per HPI unless specifically indicated above    Objective:    BP 140/68  Pulse 62  Temp(Src) 98.2 F (36.8 C) (Oral)  Ht 5' 3.5" (1.613 m)  Wt 187 lb (84.823 kg)  BMI 32.60 kg/m2  SpO2 98%  Physical Exam  Nursing note and vitals reviewed. Constitutional: She is oriented to person, place, and time. She appears well-developed and well-nourished. No distress.  HENT:  Head: Normocephalic and atraumatic.  Right Ear: Hearing, external ear and ear  canal normal.  Left Ear: Hearing, external ear and ear canal normal.  Nose: No mucosal edema or rhinorrhea. Right sinus exhibits maxillary sinus tenderness. Right sinus exhibits no frontal sinus tenderness. Left sinus exhibits maxillary sinus tenderness. Left sinus exhibits no frontal sinus tenderness.  Mouth/Throat: Uvula is midline, oropharynx is clear and moist and mucous membranes are normal. No oropharyngeal exudate, posterior oropharyngeal edema, posterior oropharyngeal erythema or tonsillar abscesses.  Cerumen covering TM bilaterally  Eyes: Conjunctivae and EOM are normal. Pupils are equal, round, and reactive to light. No scleral icterus.  Neck: Normal range of motion. Neck supple. Carotid bruit is not present. No thyromegaly present.  Cardiovascular: Normal rate, regular rhythm, normal heart sounds and intact distal pulses.   No murmur heard. Pulses:      Radial pulses are 2+ on the right side, and 2+ on the left side.  Pulmonary/Chest: Effort normal and breath sounds normal. No respiratory distress. She has no wheezes. She has no rales.  Abdominal: Soft. Bowel sounds are normal. She exhibits no distension and no mass. There is no tenderness. There is no rebound and no guarding.  Musculoskeletal: Normal range of motion. She exhibits no edema.  L ring trigger finger that gets stuck in flexion  Lymphadenopathy:    She has no cervical adenopathy.  Neurological: She is alert and oriented to person, place, and time.  CN grossly intact, station and gait intact Recall 3/3 Calculation 5/5 serial 7s  Skin: Skin is warm and dry. No rash noted.  Psychiatric: She has a normal mood and affect. Her behavior is normal. Judgment and thought content normal.   Results for orders placed in visit on 05/06/14  COMPREHENSIVE METABOLIC PANEL      Result Value Ref Range   Sodium 137  135 - 145 mEq/L   Potassium 3.5  3.5 - 5.1 mEq/L   Chloride 100  96 - 112 mEq/L   CO2 26  19 - 32 mEq/L   Glucose, Bld  132 (*) 70 - 99 mg/dL   BUN 12  6 - 23 mg/dL   Creatinine, Ser 0.8  0.4 - 1.2 mg/dL   Total Bilirubin 0.8  0.2 - 1.2 mg/dL   Alkaline Phosphatase 93  39 - 117 U/L   AST 33  0 - 37 U/L   ALT 33  0 - 35 U/L   Total Protein 7.6  6.0 - 8.3 g/dL   Albumin 3.7  3.5 - 5.2 g/dL   Calcium 9.2  8.4 - 10.5 mg/dL   GFR 75.41  >60.00 mL/min  HEMOGLOBIN A1C      Result Value Ref Range   Hemoglobin A1C 6.4  4.6 - 6.5 %  CBC WITH DIFFERENTIAL      Result Value Ref Range   WBC 8.0  4.0 - 10.5 K/uL   RBC 4.55  3.87 - 5.11 Mil/uL   Hemoglobin 12.9  12.0 - 15.0 g/dL   HCT 39.0  36.0 - 46.0 %   MCV 85.7  78.0 - 100.0 fl   MCHC 33.2  30.0 - 36.0 g/dL   RDW 14.6  11.5 - 15.5 %   Platelets 277.0  150.0 - 400.0 K/uL   Neutrophils Relative % 62.0  43.0 - 77.0 %   Lymphocytes Relative 27.0  12.0 - 46.0 %   Monocytes Relative 7.0  3.0 - 12.0 %   Eosinophils Relative 3.4  0.0 - 5.0 %   Basophils Relative 0.6  0.0 - 3.0 %   Neutro Abs 5.0  1.4 - 7.7 K/uL   Lymphs Abs 2.2  0.7 - 4.0 K/uL   Monocytes Absolute 0.6  0.1 - 1.0 K/uL   Eosinophils Absolute 0.3  0.0 - 0.7 K/uL   Basophils Absolute 0.0  0.0 - 0.1 K/uL  LIPID PANEL      Result Value Ref Range   Cholesterol 178  0 - 200 mg/dL   Triglycerides 122.0  0.0 - 149.0 mg/dL   HDL 51.20  >39.00 mg/dL   VLDL 24.4  0.0 - 40.0 mg/dL   LDL Cholesterol 102 (*) 0 - 99 mg/dL   Total CHOL/HDL Ratio 3     NonHDL 126.80        Assessment & Plan:   Problem List Items Addressed This Visit   Welcome to Medicare preventive visit - Primary     I have personally reviewed the Medicare Annual Wellness questionnaire and have noted 1. The patient's medical and social history 2. Their use of alcohol, tobacco or illicit drugs 3. Their current medications and supplements 4. The patient's functional ability including ADL's, fall risks, home safety risks and hearing or visual impairment. 5. Diet and physical activity 6. Evidence for depression or mood disorders The  patients weight, height, BMI have been recorded in the chart.  Hearing and vision has been addressed. I have made referrals, counseling and provided education to the patient based review of the above and I have provided the pt with a written personalized care plan for preventive services. Provider list updated - see scanned questionairre. Advanced directives discussed: would want son Raquel Sarna to be HCPOA. Packet provided today for pt to fill out.  Reviewed preventative protocols and updated unless pt declined. Baseline EKG done today - sinus bradycardia 50s, normal axis, intervals, no acute ST/T changes    Relevant Orders      EKG 12-Lead (Completed)   Prediabetes     Reviewed A1c with patient, discussed decreased added sugars including sweetened beverages and sugar in coffee.    Left trigger finger     Referred to hand per pt request.    Relevant Orders      Ambulatory referral to Hand Surgery   HYPERCHOLESTEROLEMIA     Chronic, stable on low dose lipitor - continue.    Relevant Medications      amLODIpine (NORVASC) tablet      atorvastatin (LIPITOR) tablet      triamterene-hydrochlorothiazide (MAXZIDE-25) 37.5-25 MG per tablet   GERD     Stable on daily PPI - does not want to lower dose    Relevant Medications      omeprazole (PRILOSEC) capsule   Extrinsic asthma     Followed by Dr. Remus Blake.    Relevant Medications      albuterol (PROVENTIL HFA;VENTOLIN HFA) 108 (90 BASE) MCG/ACT inhaler   Estrogen deficiency     Decrease menest to 1.25mg  daily. Check DEXA.    Relevant Orders      DG Bone Density   ESSENTIAL HYPERTENSION     Chronic, stable. Continue regimen of maxzide and amlodipine.    Relevant Medications      amLODIpine (NORVASC) tablet      atorvastatin (LIPITOR) tablet      triamterene-hydrochlorothiazide (MAXZIDE-25) 37.5-25 MG per tablet   ANXIETY DEPRESSION  Very stable - will decrease prozac to 10mg  daily.    Relevant Medications      FLUoxetine (PROZAC)  capsule   ALLERGIC RHINITIS     Stable on claritin, flonase.    Acute infection of nasal sinus     Treat acute sinusitis with zpack. Multiple abx allergies/intolerances    Relevant Medications      fluticasone (FLONASE) 50 MCG/ACT nasal spray      azithromycin (ZITHROMAX) tablet   Abnormal MRI, spine     Will check with radiology regarding optimal f/u plan including imaging modality.     Other Visit Diagnoses   Need for prophylactic vaccination with combined diphtheria-tetanus-pertussis (DTP) vaccine        Relevant Orders       Tdap vaccine greater than or equal to 7yo IM (Completed)    Need for prophylactic vaccination against Streptococcus pneumoniae (pneumococcus)        Relevant Orders       Pneumococcal conjugate vaccine 13-valent (Completed)        Follow up plan: Return in about 1 year (around 05/12/2015), or as needed, for annual exam, prior fasting for blood work.

## 2014-05-11 NOTE — Assessment & Plan Note (Addendum)
I have personally reviewed the Medicare Annual Wellness questionnaire and have noted 1. The patient's medical and social history 2. Their use of alcohol, tobacco or illicit drugs 3. Their current medications and supplements 4. The patient's functional ability including ADL's, fall risks, home safety risks and hearing or visual impairment. 5. Diet and physical activity 6. Evidence for depression or mood disorders The patients weight, height, BMI have been recorded in the chart.  Hearing and vision has been addressed. I have made referrals, counseling and provided education to the patient based review of the above and I have provided the pt with a written personalized care plan for preventive services. Provider list updated - see scanned questionairre. Advanced directives discussed: would want son Raquel Sarna to be HCPOA. Packet provided today for pt to fill out.  Reviewed preventative protocols and updated unless pt declined. Baseline EKG done today - sinus bradycardia 50s, normal axis, intervals, no acute ST/T changes

## 2014-05-11 NOTE — Assessment & Plan Note (Signed)
Chronic, stable. Continue regimen of maxzide and amlodipine.

## 2014-05-11 NOTE — Assessment & Plan Note (Signed)
Reviewed A1c with patient, discussed decreased added sugars including sweetened beverages and sugar in coffee.

## 2014-05-11 NOTE — Assessment & Plan Note (Signed)
Very stable - will decrease prozac to 10mg  daily.

## 2014-05-11 NOTE — Assessment & Plan Note (Signed)
Decrease menest to 1.25mg  daily. Check DEXA.

## 2014-05-11 NOTE — Assessment & Plan Note (Signed)
Stable on claritin, flonase.

## 2014-05-11 NOTE — Assessment & Plan Note (Signed)
Followed by Dr. Remus Blake.

## 2014-05-11 NOTE — Progress Notes (Signed)
Pre visit review using our clinic review tool, if applicable. No additional management support is needed unless otherwise documented below in the visit note. 

## 2014-05-11 NOTE — Assessment & Plan Note (Signed)
Chronic, stable on low dose lipitor - continue.  

## 2014-06-07 ENCOUNTER — Other Ambulatory Visit: Payer: Self-pay | Admitting: Family Medicine

## 2014-06-10 ENCOUNTER — Other Ambulatory Visit: Payer: Self-pay

## 2014-07-05 ENCOUNTER — Telehealth: Payer: Self-pay | Admitting: Family Medicine

## 2014-07-05 NOTE — Telephone Encounter (Signed)
Patient Information:  Caller Name: Collie Siad  Phone: [phone]  Patient: Jennifer Pineda  Gender: Female  DOB: [birth year]  Age: 63 Years  PCP: Ria Bush [facility])  Office Follow Up:  Does the office need to follow up with this patient?: No  Instructions For The Office: N/A  RN Note:  Pt refused offer to schedule with another office and requested appt for tomorrow.    Symptoms  Reason For Call & Symptoms: Pt reports sinus issues, headache, greenish nasal discharge.  Reviewed Health History In EMR: Yes  Reviewed Medications In EMR: Yes  Reviewed Allergies In EMR: Yes  Reviewed Surgeries / Procedures: Yes  Date of Onset of Symptoms: 06/21/2014  Guideline(s) Used:  Sinus Pain and Congestion  Disposition Per Guideline:   See Today or Tomorrow in Office  Reason For Disposition Reached:   Sinus congestion (pressure, fullness) present > 10 days  Advice Given:  Call Back If:   You become worse.  Patient Will Follow Care Advice:  YES  Appointment Scheduled:  07/06/2014 12:00:00 Appointment Scheduled Provider:  Arnette Norris [facility])

## 2014-07-06 ENCOUNTER — Encounter: Payer: Self-pay | Admitting: Family Medicine

## 2014-07-06 ENCOUNTER — Ambulatory Visit (INDEPENDENT_AMBULATORY_CARE_PROVIDER_SITE_OTHER): Payer: Commercial Managed Care - HMO | Admitting: Family Medicine

## 2014-07-06 VITALS — BP 134/70 | HR 64 | Temp 97.9°F | Wt 189.0 lb

## 2014-07-06 DIAGNOSIS — J0120 Acute ethmoidal sinusitis, unspecified: Secondary | ICD-10-CM

## 2014-07-06 MED ORDER — AZITHROMYCIN 250 MG PO TABS: ORAL_TABLET | ORAL | Status: DC

## 2014-07-06 NOTE — Progress Notes (Signed)
Pre visit review using our clinic review tool, if applicable. No additional management support is needed unless otherwise documented below in the visit note. 

## 2014-07-06 NOTE — Patient Instructions (Signed)
It was nice to meet you. You do have a sinus infection. Please take zpack as directed, continue your nasal spray, advair and loratadine.  Call us if you're not getting better in next 5-7 days

## 2014-07-06 NOTE — Progress Notes (Signed)
SUBJECTIVE:  Jennifer Pineda is a 63 y.o. female pt of Dr. Darnell Level, new to me, who complains of coryza, sore throat and bilateral sinus pain for 16 days. She denies a history of chest pain, shortness of breath, sweats and vomiting and admits to a history of asthma. Patient denies smoke cigarettes.  She has been using her advair, loratidine, flonase and guaifenesin as directed.   Current Outpatient Prescriptions on File Prior to Visit  Medication Sig Dispense Refill  . albuterol (PROVENTIL HFA;VENTOLIN HFA) 108 (90 BASE) MCG/ACT inhaler Inhale 2 puffs into the lungs every 6 (six) hours as needed. For shortness of breath. 54 Inhaler 3  . amLODipine (NORVASC) 10 MG tablet Take 1 tablet (10 mg total) by mouth daily. 90 tablet 3  . atorvastatin (LIPITOR) 10 MG tablet TAKE ONE TABLET BY MOUTH AT BEDTIME 90 tablet 3  . EQ LORATADINE 10 MG tablet TAKE ONE TABLET BY MOUTH ONCE DAILY 30 tablet 1  . Esterified Estrogens 1.25 MG TABS Take 1 tablet (1.25 mg total) by mouth daily. 90 each 3  . FLUoxetine (PROZAC) 10 MG capsule TAKE ONE CAPSULE BY MOUTH ONCE DAILY 90 capsule 3  . fluticasone (FLONASE) 50 MCG/ACT nasal spray Place 2 sprays into both nostrils daily. 48 g 3  . Fluticasone-Salmeterol (ADVAIR DISKUS) 250-50 MCG/DOSE AEPB Inhale 1 puff into the lungs 2 (two) times daily. 1 each 6  . guaifenesin (HUMIBID E) 400 MG TABS Take 400 mg by mouth daily.    Marland Kitchen omeprazole (PRILOSEC) 40 MG capsule TAKE ONE CAPSULE BY MOUTH IN THE MORNING. TAKE 45 MINUTES PRIOR TO BREAKFAST 90 capsule 3  . triamterene-hydrochlorothiazide (MAXZIDE-25) 37.5-25 MG per tablet TAKE ONE TABLET BY MOUTH ONCE DAILY 90 tablet 3   No current facility-administered medications on file prior to visit.    Allergies  Allergen Reactions  . Levofloxacin     REACTION: Swelling of throat, SOB  . Ampicillin     Diarrhea, stomach upset  . Doxycycline Hyclate     Diarrhea, stomach upset  . Metoprolol Succinate     Diarrhea, stomach upset  .  Tetracycline Hcl     Diarrhea, stomach upset    Past Medical History  Diagnosis Date  . GERD (gastroesophageal reflux disease)   . Essential hypertension   . Hypercholesteremia   . Anxiety and depression   . Hematuria 03/27/2001    FH, transitional cell CA of Kidney ? work up neg (Dr. Melodie Bouillon)  . Extrinsic asthma   . Allergic rhinitis   . HNP (herniated nucleus pulposus) 2013    R L5-S1 disc herniation with marked radiculopathy (Pool) s/p laminectomy  . Abnormal MRI, spine 2013    w/u negative for metastatic cancer (PET, pan CT, bone biopsy negative)  . OSA on CPAP     Past Surgical History  Procedure Laterality Date  . Partial hysterectomy  1983    ovaries intact, endometriosis  . Abd ultrasound  02/2000    normal  . Esophagogastroduodenoscopy  03/2000    hiatal hernia  . Septoplasty  10/2000    Dr. Ernesto Rutherford, with windows (?)  . Appendectomy  1983  . Nsvd      x2, (breech, macro)  . Laminectomy and microdiscectomy lumbar spine  05/2012    for HNP ([facility])  . Colonoscopy  09/2012    small int hem Carlean Purl) rpt 10 yrs  . Hand surgery Right     trigger finger    Family History  Problem Relation Age  of Onset  . Cancer Mother     kidney and lymphoma CA, neprectomy tumor of lung, non smoker  . Kidney disease Mother     T-cell cancer in kidney  . Aneurysm Father     ? in throat  . Diabetes Father   . Heart disease Father     MI  . Diabetes Sister   . Hyperlipidemia Sister   . Heart disease Brother 52    MI  . Alcohol abuse Brother     smokes again  . Hypertension Brother   . Heart disease Maternal Aunt     MI  . Cancer Brother 79    T-cell renal cancer/ removed  . Hypertension Brother   . Colon cancer Neg Hx   . Esophageal cancer Neg Hx   . Rectal cancer Neg Hx   . Stomach cancer Neg Hx     History   Social History  . Marital Status: Divorced    Spouse Name: N/A    Number of Children: 2  . Years of Education: N/A   Occupational History  .  Data Analysis     [employer]   Social History Main Topics  . Smoking status: Former Smoker -- 1.50 packs/day for 19 years    Quit date: 08/26/1986  . Smokeless tobacco: Never Used  . Alcohol Use: 1.0 oz/week    2 drink(s) per week     Comment: occassionally  . Drug Use: No  . Sexual Activity: No   Other Topics Concern  . Not on file   Social History Narrative   Divorced   Daughter [name], lives with her   Children: 2 children // one daughter lives with mother/ son lives with father      Advanced directives - would like packet. Would want son [name]) to be HCPOA.         The PMH, PSH, Social History, Family History, Medications, and allergies have been reviewed in [facility], and have been updated if relevant.  OBJECTIVE: BP 134/70 mmHg  Pulse 64  Temp(Src) 97.9 F (36.6 C) (Oral)  Wt 189 lb (85.73 kg)  SpO2 96%  She appears well, vital signs are as noted. Ears normal.  Throat and pharynx normal.  Neck supple. No adenopathy in the neck. Nose is congested. Sinuses tender throughout. The chest is clear, without wheezes or rales.  ASSESSMENT:  sinusitis  PLAN: Given duration and progression of symptoms, will treat for bacterial sinusitis. Multiple abx allergies- eRx sent for zpack. Symptomatic therapy suggested: push fluids, rest and return office visit prn if symptoms persist or worsenCall or return to clinic prn if these symptoms worsen or fail to improve as anticipated.

## 2014-07-18 ENCOUNTER — Ambulatory Visit: Payer: Self-pay | Admitting: Family Medicine

## 2014-07-18 LAB — HM MAMMOGRAPHY

## 2014-07-19 ENCOUNTER — Encounter: Payer: Self-pay | Admitting: Family Medicine

## 2014-07-20 ENCOUNTER — Encounter: Payer: Self-pay | Admitting: Family Medicine

## 2014-07-23 ENCOUNTER — Other Ambulatory Visit: Payer: Self-pay | Admitting: Family Medicine

## 2014-07-23 ENCOUNTER — Encounter: Payer: Self-pay | Admitting: Family Medicine

## 2014-08-26 DIAGNOSIS — C689 Malignant neoplasm of urinary organ, unspecified: Secondary | ICD-10-CM

## 2014-08-26 HISTORY — DX: Malignant neoplasm of urinary organ, unspecified: C68.9

## 2014-08-29 ENCOUNTER — Other Ambulatory Visit: Payer: Self-pay | Admitting: Family Medicine

## 2014-08-31 ENCOUNTER — Telehealth: Payer: Self-pay | Admitting: *Deleted

## 2014-08-31 NOTE — Telephone Encounter (Signed)
PA faxed. Will await determination. 

## 2014-08-31 NOTE — Telephone Encounter (Signed)
PA for Menest in your IN box for completion.

## 2014-08-31 NOTE — Telephone Encounter (Signed)
Filled and in Kim's box. 

## 2014-09-01 NOTE — Telephone Encounter (Addendum)
Rx approved. Pharmacy notified.

## 2014-09-13 ENCOUNTER — Ambulatory Visit (INDEPENDENT_AMBULATORY_CARE_PROVIDER_SITE_OTHER): Payer: Commercial Managed Care - HMO | Admitting: Internal Medicine

## 2014-09-13 ENCOUNTER — Encounter: Payer: Self-pay | Admitting: Internal Medicine

## 2014-09-13 VITALS — BP 142/70 | HR 71 | Temp 98.4°F | Wt 191.0 lb

## 2014-09-13 DIAGNOSIS — J4520 Mild intermittent asthma, uncomplicated: Secondary | ICD-10-CM

## 2014-09-13 DIAGNOSIS — J209 Acute bronchitis, unspecified: Secondary | ICD-10-CM | POA: Insufficient documentation

## 2014-09-13 MED ORDER — AZITHROMYCIN 250 MG PO TABS: ORAL_TABLET | ORAL | Status: DC

## 2014-09-13 MED ORDER — HYDROCODONE-HOMATROPINE 5-1.5 MG/5ML PO SYRP: 5.0000 mL | ORAL_SOLUTION | Freq: Every evening | ORAL | Status: DC | PRN

## 2014-09-13 NOTE — Assessment & Plan Note (Signed)
No clear exacerbation Will hold off on prednisone Has appt next week with Dr Remus Blake

## 2014-09-13 NOTE — Assessment & Plan Note (Signed)
Sick over 2 weeks Will try empiric z-pak Cough syrup

## 2014-09-13 NOTE — Progress Notes (Signed)
Pre visit review using our clinic review tool, if applicable. No additional management support is needed unless otherwise documented below in the visit note. 

## 2014-09-13 NOTE — Progress Notes (Signed)
Subjective:    Patient ID: Jennifer Pineda, female    DOB: 22-May-1951, 64 y.o.   MRN: [id number]  HPI Here for evaluation of cough  Started about 2 weeks ago Very coarse--mostly white and some yellow sputum. Mostly morning at night  Does have some PND-but notes pain in bronchial area when coughing No fever Some sore throat No ear pain Some maxillary pressure  Some SOB but not bad Might have mild asthma symptoms---- like if extended walking No wheezing No night sweats or chills  Tried OTC robitussin--not really helping  Current Outpatient Prescriptions on File Prior to Visit  Medication Sig Dispense Refill  . albuterol (PROVENTIL HFA;VENTOLIN HFA) 108 (90 BASE) MCG/ACT inhaler Inhale 2 puffs into the lungs every 6 (six) hours as needed. For shortness of breath. 54 Inhaler 3  . amLODipine (NORVASC) 10 MG tablet Take 1 tablet (10 mg total) by mouth daily. 90 tablet 3  . atorvastatin (LIPITOR) 10 MG tablet TAKE ONE TABLET BY MOUTH AT BEDTIME 90 tablet 3  . FLUoxetine (PROZAC) 10 MG capsule TAKE ONE CAPSULE BY MOUTH ONCE DAILY 90 capsule 3  . fluticasone (FLONASE) 50 MCG/ACT nasal spray Place 2 sprays into both nostrils daily. 48 g 3  . Fluticasone-Salmeterol (ADVAIR DISKUS) 250-50 MCG/DOSE AEPB Inhale 1 puff into the lungs 2 (two) times daily. 1 each 6  . guaifenesin (HUMIBID E) 400 MG TABS Take 400 mg by mouth daily.    Marland Kitchen loratadine (CLARITIN) 10 MG tablet TAKE ONE TABLET BY MOUTH ONCE DAILY 30 tablet 11  . MENEST 0.3 MG tablet TAKE ONE TABLET BY MOUTH ONCE DAILY 90 tablet 1  . omeprazole (PRILOSEC) 40 MG capsule TAKE ONE CAPSULE BY MOUTH IN THE MORNING. TAKE 45 MINUTES PRIOR TO BREAKFAST 90 capsule 3  . triamterene-hydrochlorothiazide (MAXZIDE-25) 37.5-25 MG per tablet TAKE ONE TABLET BY MOUTH ONCE DAILY 90 tablet 3   No current facility-administered medications on file prior to visit.    Allergies  Allergen Reactions  . Levofloxacin     REACTION: Swelling of throat,  SOB  . Ampicillin     Diarrhea, stomach upset  . Doxycycline Hyclate     Diarrhea, stomach upset  . Metoprolol Succinate     Diarrhea, stomach upset  . Tetracycline Hcl     Diarrhea, stomach upset    Past Medical History  Diagnosis Date  . GERD (gastroesophageal reflux disease)   . Essential hypertension   . Hypercholesteremia   . Anxiety and depression   . Hematuria 03/27/2001    FH, transitional cell CA of Kidney ? work up neg (Dr. Melodie Bouillon)  . Extrinsic asthma   . Allergic rhinitis   . HNP (herniated nucleus pulposus) 2013    R L5-S1 disc herniation with marked radiculopathy ([facility]) s/p laminectomy  . Abnormal MRI, spine 2013    w/u negative for metastatic cancer (PET, pan CT, bone biopsy negative)  . OSA on CPAP     Past Surgical History  Procedure Laterality Date  . Partial hysterectomy  1983    ovaries intact, endometriosis  . Abd ultrasound  02/2000    normal  . Esophagogastroduodenoscopy  03/2000    hiatal hernia  . Septoplasty  10/2000    Dr. Ernesto Rutherford, with windows (?)  . Appendectomy  1983  . Nsvd      x2, (breech, macro)  . Laminectomy and microdiscectomy lumbar spine  05/2012    for HNP ([facility])  . Colonoscopy  09/2012    small  int hem Carlean Purl) rpt 10 yrs  . Hand surgery Right     trigger finger  . Dexa  06/2014    WNL (T 0.8)    Family History  Problem Relation Age of Onset  . Cancer Mother     kidney and lymphoma CA, neprectomy tumor of lung, non smoker  . Kidney disease Mother     T-cell cancer in kidney  . Aneurysm Father     ? in throat  . Diabetes Father   . Heart disease Father     MI  . Diabetes Sister   . Hyperlipidemia Sister   . Heart disease Brother 19    MI  . Alcohol abuse Brother     smokes again  . Hypertension Brother   . Heart disease Maternal Aunt     MI  . Cancer Brother 7    T-cell renal cancer/ removed  . Hypertension Brother   . Colon cancer Neg Hx   . Esophageal cancer Neg Hx   . Rectal cancer Neg Hx    . Stomach cancer Neg Hx     History   Social History  . Marital Status: Divorced    Spouse Name: N/A    Number of Children: 2  . Years of Education: N/A   Occupational History  . Data Analysis     [employer]   Social History Main Topics  . Smoking status: Former Smoker -- 1.50 packs/day for 19 years    Quit date: 08/26/1986  . Smokeless tobacco: Never Used  . Alcohol Use: 1.0 oz/week    2 drink(s) per week     Comment: occassionally  . Drug Use: No  . Sexual Activity: No   Other Topics Concern  . Not on file   Social History Narrative   Divorced   Daughter Jennifer Pineda, lives with her   Children: 2 children // one daughter lives with mother/ son lives with father      Advanced directives - would like packet. Would want son Jennifer Pineda) to be HCPOA.         Review of Systems  No rash No vomiting or diarrhea Appetite is fine     Objective:   Physical Exam  Constitutional: No distress.  freq coarse bronchial cough  HENT:  Mild maxillary tenderness TMs fine Mild nasal inflammation Mild pharyngeal injection without exudates  Neck: Normal range of motion. Neck supple.  Pulmonary/Chest: Effort normal and breath sounds normal. No respiratory distress. She has no wheezes. She has no rales.          Assessment & Plan:

## 2014-09-20 ENCOUNTER — Encounter: Payer: Self-pay | Admitting: Family Medicine

## 2014-09-20 ENCOUNTER — Ambulatory Visit (INDEPENDENT_AMBULATORY_CARE_PROVIDER_SITE_OTHER): Payer: Commercial Managed Care - HMO | Admitting: Family Medicine

## 2014-09-20 VITALS — BP 142/62 | HR 69 | Temp 98.4°F | Wt 193.2 lb

## 2014-09-20 DIAGNOSIS — J209 Acute bronchitis, unspecified: Secondary | ICD-10-CM

## 2014-09-20 MED ORDER — PREDNISONE 20 MG PO TABS
ORAL_TABLET | ORAL | Status: DC
Start: 2014-09-20 — End: 2014-09-27

## 2014-09-20 NOTE — Patient Instructions (Signed)
Keep using your inhalers, start prednisone (with food) and this should gradually get better. Notify us if not improving. Take care.  Glad to see you.

## 2014-09-20 NOTE — Progress Notes (Signed)
Pre visit review using our clinic review tool, if applicable. No additional management support is needed unless otherwise documented below in the visit note.  Sick for 2 weeks prior to visit last week with Dr. Silvio Pate.  Presumed bronchitis, started on zmax and hycodan.  Done with zmax.  In meantime she is no better.  She is still coughing, not as much as night due to cough medicine.  Some white sputum, prev with some scant yellow sputum a few days ago.  No fevers.  Some wheeze.  Still on inhalers, with some relief.  No vomiting.  No diarrhea.  No fevers.  Some ST from coughing and post nasal gtt.  No ear pain.   Cough is most bothersome.  Chest wall sore from coughing.    Couldn't get appointment today with Dr. Orvil Feil since her insurance required a referral (to keep going to the doc she was prev seeing).  Meds, vitals, and allergies reviewed.   ROS: See HPI.  Otherwise, noncontributory.  GEN: nad, alert and oriented HEENT: mucous membranes moist, tm w/o erythema, nasal exam w/o erythema, clear discharge noted,  OP with cobblestoning NECK: supple w/o LA CV: rrr.   PULM: scant wheeze, cough noted, o/w ctab, no inc wob, no focal dec in BS.  EXT: no edema SKIN: no acute rash

## 2014-09-21 NOTE — Assessment & Plan Note (Signed)
S/p abx, with presentation that appears more consistent with irritation/inflammation instead of current infection, d/w pt.  No discolored sputum now, would not continue abx but add on prednisone taper.  Okay for outpatient fu. Nontoxic.  She agrees.

## 2014-09-26 ENCOUNTER — Telehealth: Payer: Self-pay | Admitting: Family Medicine

## 2014-09-26 NOTE — Telephone Encounter (Signed)
Patient Name: ZELLA DEWAN  DOB: 07-Apr-1951    Initial Comment Caller states that (sue shlow) she saw dr for bronchitis and sinus infection, treated for 4 months, new rx last tuesday, still coughing a lot   Nurse Assessment  Nurse: [name] Barges, RN, [place] Date/Time (Eastern Time): 09/26/2014 5:10:16 PM  Confirm and document reason for call. If symptomatic, describe symptoms. ---Caller states that (sue shlow) she saw doctor for bronchitis and sinus infection, treated for 4 months, new rx last Tuesday, still coughing a lot. No fever. Non productive /productive cough. Pt still on Prednisone for 3 more days. Eats/drinks. Urinates. Soft stools  Has the patient traveled out of the country within the last 30 days? ---No  Does the patient require triage? ---Yes  Related visit to physician within the last 2 weeks? ---Yes  Does the PT have any chronic conditions? (i.e. diabetes, asthma, etc.) ---Yes  List chronic conditions. ---Asthma     Guidelines    Guideline Title Affirmed Question Affirmed Notes  Cough - Chronic SEVERE coughing spells (e.g., whooping sound after coughing, vomiting after coughing)    Final Disposition User   See Physician within [place], RN, [employer]    Comments  Returned call to the pt and advised of appt scheduled at 0900 09/27/14 with Amy Bledsoe. Agreeable with POC. Advised if she gets worse, she is to be seen this eve in ER or [facility] but make sure to keep appt in the am as well with Dr Josefa Half. Verb understanding.

## 2014-09-27 ENCOUNTER — Ambulatory Visit (INDEPENDENT_AMBULATORY_CARE_PROVIDER_SITE_OTHER)
Admission: RE | Admit: 2014-09-27 | Discharge: 2014-09-27 | Disposition: A | Discharge status: Home / Self Care | Payer: Commercial Managed Care - HMO | Source: Ambulatory Visit | Attending: Family Medicine | Admitting: Family Medicine

## 2014-09-27 ENCOUNTER — Encounter: Payer: Self-pay | Admitting: Family Medicine

## 2014-09-27 ENCOUNTER — Ambulatory Visit (INDEPENDENT_AMBULATORY_CARE_PROVIDER_SITE_OTHER): Payer: Commercial Managed Care - HMO | Admitting: Family Medicine

## 2014-09-27 VITALS — BP 140/60 | HR 79 | Temp 98.3°F | Ht 63.5 in | Wt 192.5 lb

## 2014-09-27 DIAGNOSIS — R05 Cough: Secondary | ICD-10-CM

## 2014-09-27 DIAGNOSIS — J4531 Mild persistent asthma with (acute) exacerbation: Secondary | ICD-10-CM

## 2014-09-27 DIAGNOSIS — K219 Gastro-esophageal reflux disease without esophagitis: Secondary | ICD-10-CM

## 2014-09-27 DIAGNOSIS — R053 Chronic cough: Secondary | ICD-10-CM

## 2014-09-27 MED ORDER — PREDNISONE 20 MG PO TABS
ORAL_TABLET | ORAL | Status: DC
Start: 2014-09-27 — End: 2014-10-12

## 2014-09-27 NOTE — Assessment & Plan Note (Signed)
Possible cause of symptoms. Change to nexium. Decrease trigger foods.

## 2014-09-27 NOTE — Assessment & Plan Note (Addendum)
Eval with CXR: no sign of PNA or mass. Will send for radiologist read.  Peak flows: 340 best: will repeat course of prednisone but higher dose and longer.  Albuterol prn and  advair daily.   Possible component of occult reflux... cahnge to nexium 40 mg daily

## 2014-09-27 NOTE — Progress Notes (Signed)
Subjective:    Patient ID: Jennifer Pineda, female    DOB: March 21, 1951, 64 y.o.   MRN: [id number]  HPI 64 year old female pt of Dr. Darnell Level with of asthma presents with continued continuous cough. She is maintained on advair, guafenesin and claritin for her asthma control.   She stated having symptoms in Nov 2015. 07/06/14: Saw Dr. Deborra Medina for sinus infeciton Treated with Z-pak.  09/13/2014 Saw Dr. Silvio Pate. Presumed bronchitis, started on zmax and hycodan. 1/ 26/2016 Saw Dr. Damita Dunnings Dx asthma flare, more consistent with irritation/inflammation instead of current infection.  No discolored sputum at that time,  Recs were to not continue abx but add on prednisone taper.  Today she reports she is not any better.  Continuous cough, mild rhinnorhea in last 2 days. Chest tightness.  She is SOB, wheeze off and on. Using albuterol daily occ in middlle of day. Chest pain in base of diaphragm off and on, occ sharp. No fever, no chills. She still has productive cough, white mucus, thick.  She has 2 days left of prednisone, helped some but not much.  She has hx of GERD, no current heartburn.. On omeprazole 40 mg daily.  Former smoker 19 pack year histroy, [place].   Review of Systems  Constitutional: Negative for fever and fatigue.  HENT: Negative for ear pain.   Eyes: Negative for pain.  Respiratory: Positive for cough, chest tightness and shortness of breath.   Cardiovascular: Negative for chest pain, palpitations and leg swelling.  Gastrointestinal: Negative for abdominal pain.  Genitourinary: Negative for dysuria.       Objective:   Physical Exam  Constitutional: Vital signs are normal. She appears well-developed and well-nourished. She is cooperative.  Non-toxic appearance. She does not appear ill. No distress.  HENT:  Head: Normocephalic.  Right Ear: Hearing, tympanic membrane, external ear and ear canal normal. Tympanic membrane is not erythematous, not retracted and not bulging.    Left Ear: Hearing, tympanic membrane, external ear and ear canal normal. Tympanic membrane is not erythematous, not retracted and not bulging.  Nose: No mucosal edema or rhinorrhea. Right sinus exhibits no maxillary sinus tenderness and no frontal sinus tenderness. Left sinus exhibits no maxillary sinus tenderness and no frontal sinus tenderness.  Mouth/Throat: Uvula is midline, oropharynx is clear and moist and mucous membranes are normal.  Eyes: Conjunctivae, EOM and lids are normal. Pupils are equal, round, and reactive to light. Lids are everted and swept, no foreign bodies found.  Neck: Trachea normal and normal range of motion. Neck supple. Carotid bruit is not present. No thyroid mass and no thyromegaly present.  Cardiovascular: Normal rate, regular rhythm, S1 normal, S2 normal, normal heart sounds, intact distal pulses and normal pulses.  Exam reveals no gallop and no friction rub.   No murmur heard. Pulmonary/Chest: Effort normal and breath sounds normal. No tachypnea. No respiratory distress. She has no decreased breath sounds. She has no wheezes. She has no rhonchi. She has no rales.  Frequent  dry cough  Abdominal: Soft. Normal appearance and bowel sounds are normal. There is tenderness in the epigastric area.  Mild    Neurological: She is alert.  Skin: Skin is warm, dry and intact. No rash noted.  Psychiatric: Her speech is normal and behavior is normal. Judgment and thought content normal. Her mood appears not anxious. Cognition and memory are normal. She does not exhibit a depressed mood.          Assessment & Plan:

## 2014-09-27 NOTE — Progress Notes (Signed)
Pre visit review using our clinic review tool, if applicable. No additional management support is needed unless otherwise documented below in the visit note. 

## 2014-09-27 NOTE — Patient Instructions (Addendum)
Stop prilosec and try trial of 2 tabs of 20 mg nexium OTC. Higher dose longer taper of prednisone.  Call if not improving at end of course of taper.

## 2014-10-12 ENCOUNTER — Telehealth: Payer: Self-pay

## 2014-10-12 MED ORDER — PREDNISONE 20 MG PO TABS: ORAL_TABLET | ORAL | Status: DC

## 2014-10-12 NOTE — Telephone Encounter (Signed)
Pt left v/m; pt was seen 09/27/14;pts cough was doing a lot better while taking prednisone; pt has finished prednisone; on 10/08/14 cough began to worsen again; pt wants to know if can get refill on prednisone or does pt need to be reevaluated. Pt request cb.[facility] [address]

## 2014-10-12 NOTE — Telephone Encounter (Signed)
Ms. Poster notified refill for prednisone has been sent to her pharmacy.  If not improving after this taper will need to make an appointment with Dr. Darnell Level to be reevaluated.  Patient states understanding.

## 2014-10-12 NOTE — Telephone Encounter (Signed)
Okay to refill prednisone as requested same taper. Pt to make appt with PCP if not improving after this.

## 2014-10-25 ENCOUNTER — Encounter: Payer: Self-pay | Admitting: Family Medicine

## 2014-10-25 ENCOUNTER — Ambulatory Visit (INDEPENDENT_AMBULATORY_CARE_PROVIDER_SITE_OTHER): Payer: Commercial Managed Care - HMO | Admitting: Family Medicine

## 2014-10-25 VITALS — BP 142/46 | HR 62 | Temp 98.3°F | Wt 194.4 lb

## 2014-10-25 DIAGNOSIS — R937 Abnormal findings on diagnostic imaging of other parts of musculoskeletal system: Secondary | ICD-10-CM

## 2014-10-25 DIAGNOSIS — R05 Cough: Secondary | ICD-10-CM

## 2014-10-25 DIAGNOSIS — IMO0002 Reserved for concepts with insufficient information to code with codable children: Secondary | ICD-10-CM

## 2014-10-25 DIAGNOSIS — J45998 Other asthma: Secondary | ICD-10-CM

## 2014-10-25 DIAGNOSIS — R053 Chronic cough: Secondary | ICD-10-CM

## 2014-10-25 MED ORDER — IPRATROPIUM BROMIDE 0.02 % IN SOLN
0.5000 mg | Freq: Once | RESPIRATORY_TRACT | Status: AC
  Administered 2014-10-25: 0.5 mg via RESPIRATORY_TRACT

## 2014-10-25 MED ORDER — FLUTICASONE-SALMETEROL 500-50 MCG/DOSE IN AEPB: 1.0000 | INHALATION_SPRAY | Freq: Two times a day (BID) | RESPIRATORY_TRACT | Status: DC

## 2014-10-25 MED ORDER — ALBUTEROL SULFATE (2.5 MG/3ML) 0.083% IN NEBU
2.5000 mg | INHALATION_SOLUTION | Freq: Once | RESPIRATORY_TRACT | Status: AC
  Administered 2014-10-25: 2.5 mg via RESPIRATORY_TRACT

## 2014-10-25 MED ORDER — TRAMADOL HCL 50 MG PO TABS: 25.0000 mg | ORAL_TABLET | Freq: Three times a day (TID) | ORAL | Status: DC | PRN

## 2014-10-25 NOTE — Progress Notes (Signed)
Pre visit review using our clinic review tool, if applicable. No additional management support is needed unless otherwise documented below in the visit note. 

## 2014-10-25 NOTE — Assessment & Plan Note (Addendum)
nexium didn't resolve cough, multiple prednisone and abx courses have not resolved cough. Anticipate asthma flare, ?cough variant asthma  Treat with increased advair to 500/50mg  1 puff bid, schedule albuterol over next 3 days. Will also try tramadol for cough suppression. Alb/atrovent neb in office today with peak flow prior and after. If no improvement with above, will refer to pulm. Pt agrees with plan.

## 2014-10-25 NOTE — Progress Notes (Signed)
BP 142/46 mmHg  Pulse 62  Temp(Src) 98.3 F (36.8 C)  Wt 194 lb 6.4 oz (88.179 kg)  SpO2 94%  PF 300 L/min   CC: cough  Subjective:    Patient ID: Jennifer Pineda, female    DOB: 1951/01/27, 64 y.o.   MRN: [id number]  HPI: Jennifer Pineda is a 64 y.o. female presenting on 10/25/2014 for Cough   Ongoing hoarse harsh cough productive of thick white mucous over last 4-5 months (since 06/2014), has seen my partners multiple times in last few months, has undergone several treatments without significant relief. Chest and ribcage pain from coughing. + PNDrainage. Endorses wheezing and some dyspnea. Denies fevers/chills, ear or tooth pain, headache, ST. Worse cough in mornings. No significant DOE. No leg swelling.  Taking advair 2 puffs in am. Taking albuterol inhaler prn - rarely. Takes guaifenesin daily. Takes flonase 2 sprays daily. Takes claritin daily. On omeprazole 40mg  one pill daily or nexium 20mg  2 pills daily. Wears CPAP regularly.  Unsure triggers of cough other than heat and exertion.   04/2014 saw myself with dx bronchitis and zpack treatment. 07/06/14 Dr. Deborra Medina for sinus infeciton Treated with Z-pak. 09/13/2014 Dr. Silvio Pate. Presumed bronchitis, started on zmax and hycodan. 1/ 26/2016 Dr. Damita Dunnings Dx asthma flare, more consistent with irritation/inflammation instead of current infection. Recs were to not continue abx but add on prednisone taper. 09/27/2014 seen by Dr Diona Browner with CXR - no PNA or mass. Peak flows 340 best. Treated with longer and larger prednisone taper. Continued albuterol and advair. Added nexium 40mg  daily without significant improvement. 10/12/2014 Prednisone refilled.  Has not seen Dr Remus Blake - appt postponed to April 22nd 2016.   zpack helped sinus congestion. Prednisone courses and hycodan cough syrup help suppress cough but only temporarily.   H/o asthma - dx last 2013 - sees Dr. Remus Blake. Skin testing done. Had spirometry done last year as  well.  2014 had similar prolonged cough episode that lase several months to resolve.   HNP (herniated nucleus pulposus) Date: 2013 R L5-S1 disc herniation with marked radiculopathy (Pool) s/p laminectomy  Abnormal MRI, spine Date: 2013 w/u negative for metastatic cancer (PET, pan CT, bone biopsy negative)  CHEST 2 VIEW COMPARISON: 09/22/2012 FINDINGS: Stable chronic biapical pleural parenchymal scarring. Thoracic spondylosis. Mild enlargement of the cardiopericardial silhouette. No edema. No pleural effusion. Abdominal aortic atherosclerotic calcification visible on the lateral projection. The lungs appear otherwise clear. IMPRESSION: 1. Mild enlargement of the cardiopericardial silhouette, without edema. 2. Chronic biapical pleural parenchymal scarring, considered benign. 3. Thoracic spondylosis. 4. Abdominal aortic atherosclerotic calcification. Electronically Signed  By: Sherryl Barters M.D.  On: 09/27/2014 14:07  Relevant past medical, surgical, family and social history reviewed and updated as indicated. Interim medical history since our last visit reviewed. Allergies and medications reviewed and updated. Current Outpatient Prescriptions on File Prior to Visit  Medication Sig  . albuterol (PROVENTIL HFA;VENTOLIN HFA) 108 (90 BASE) MCG/ACT inhaler Inhale 2 puffs into the lungs every 6 (six) hours as needed. For shortness of breath.  Marland Kitchen amLODipine (NORVASC) 10 MG tablet Take 1 tablet (10 mg total) by mouth daily.  Marland Kitchen atorvastatin (LIPITOR) 10 MG tablet TAKE ONE TABLET BY MOUTH AT BEDTIME  . FLUoxetine (PROZAC) 10 MG capsule TAKE ONE CAPSULE BY MOUTH ONCE DAILY  . fluticasone (FLONASE) 50 MCG/ACT nasal spray Place 2 sprays into both nostrils daily.  Marland Kitchen guaifenesin (HUMIBID E) 400 MG TABS Take 400 mg by mouth daily.  Marland Kitchen loratadine (CLARITIN) 10 MG  tablet TAKE ONE TABLET BY MOUTH ONCE DAILY  . MENEST 0.3 MG tablet TAKE ONE TABLET BY MOUTH ONCE DAILY  . omeprazole (PRILOSEC) 40 MG  capsule TAKE ONE CAPSULE BY MOUTH IN THE MORNING. TAKE 45 MINUTES PRIOR TO BREAKFAST  . triamterene-hydrochlorothiazide (MAXZIDE-25) 37.5-25 MG per tablet TAKE ONE TABLET BY MOUTH ONCE DAILY   No current facility-administered medications on file prior to visit.    Review of Systems Per HPI unless specifically indicated above     Objective:    BP 142/46 mmHg  Pulse 62  Temp(Src) 98.3 F (36.8 C)  Wt 194 lb 6.4 oz (88.179 kg)  SpO2 94%  PF 300 L/min  Wt Readings from Last 3 Encounters:  10/25/14 194 lb 6.4 oz (88.179 kg)  09/27/14 192 lb 8 oz (87.317 kg)  09/20/14 193 lb 4 oz (87.658 kg)    Physical Exam  Constitutional: She appears well-developed and well-nourished. No distress.  HENT:  Head: Normocephalic and atraumatic.  Right Ear: Hearing, tympanic membrane, external ear and ear canal normal.  Left Ear: Hearing, tympanic membrane, external ear and ear canal normal.  Nose: No mucosal edema or rhinorrhea. Right sinus exhibits no maxillary sinus tenderness and no frontal sinus tenderness. Left sinus exhibits no maxillary sinus tenderness and no frontal sinus tenderness.  Mouth/Throat: Uvula is midline, oropharynx is clear and moist and mucous membranes are normal. No oropharyngeal exudate, posterior oropharyngeal edema, posterior oropharyngeal erythema or tonsillar abscesses.  Eyes: Conjunctivae and EOM are normal. Pupils are equal, round, and reactive to light. No scleral icterus.  Neck: Normal range of motion. Neck supple. No thyromegaly present.  Cardiovascular: Normal rate, regular rhythm, normal heart sounds and intact distal pulses.   No murmur heard. Pulmonary/Chest: Effort normal and breath sounds normal. No respiratory distress. She has no wheezes. She has no rales.  No obvious wheezing  Lymphadenopathy:    She has no cervical adenopathy.  Skin: Skin is warm and dry. No rash noted.  Nursing note and vitals reviewed.      Assessment & Plan:   Problem List Items  Addressed This Visit    Persistent asthma    See above. Will not restart prednisone at this time. Pre peak flow 200, post peak flow 300, expected 400. (50%->75% predicted after nebulization).      Relevant Medications   Fluticasone-Salmeterol (ADVAIR) 500-50 MCG/DOSE AEPB   albuterol (PROVENTIL) (2.5 MG/3ML) 0.083% nebulizer solution 2.5 mg (Completed)   ipratropium (ATROVENT) nebulizer solution 0.5 mg (Completed)   Chronic cough - Primary    nexium didn't resolve cough, multiple prednisone and abx courses have not resolved cough. Anticipate asthma flare, ?cough variant asthma  Treat with increased advair to 500/50mg  1 puff bid, schedule albuterol over next 3 days. Will also try tramadol for cough suppression. Alb/atrovent neb in office today with peak flow prior and after. If no improvement with above, will refer to pulm. Pt agrees with plan.       Relevant Medications   albuterol (PROVENTIL) (2.5 MG/3ML) 0.083% nebulizer solution 2.5 mg (Completed)   ipratropium (ATROVENT) nebulizer solution 0.5 mg (Completed)   Abnormal MRI, spine    I touched base with radiology re h/o abnormal lumbar MRI concerning for metastatic disease but workup unrevealing (including vertrabral biopsy). They recommend rpt lumbar MRI with and without contrast. If stable, likely benign finding. Discussed and pt amenable to this.      Relevant Orders   MR Lumbar Spine W Wo Contrast  Follow up plan: Return if symptoms worsen or fail to improve.

## 2014-10-25 NOTE — Patient Instructions (Addendum)
Increase advair to 500/50 twice daily. Schedule albuterol inhaler 2 puffs three times daily for next 3 days. Call me with update on Thursday or Friday with update. If no improvement with above, we will refer you to lung doctor.  We will call you to schedule repeat MRI in next few weeks.

## 2014-10-25 NOTE — Assessment & Plan Note (Addendum)
I touched base with radiology re h/o abnormal lumbar MRI concerning for metastatic disease but workup unrevealing (including vertrabral biopsy). They recommend rpt lumbar MRI with and without contrast. If stable, likely benign finding. Discussed and pt amenable to this.

## 2014-10-25 NOTE — Assessment & Plan Note (Addendum)
See above. Will not restart prednisone at this time. Pre peak flow 200, post peak flow 300, expected 400. (50%->75% predicted after nebulization).

## 2014-10-26 ENCOUNTER — Ambulatory Visit: Payer: Commercial Managed Care - HMO | Admitting: Family Medicine

## 2014-10-27 ENCOUNTER — Telehealth: Payer: Self-pay | Admitting: Family Medicine

## 2014-10-27 DIAGNOSIS — R937 Abnormal findings on diagnostic imaging of other parts of musculoskeletal system: Secondary | ICD-10-CM

## 2014-10-27 NOTE — Telephone Encounter (Signed)
Patient is scheduled for an MRI on 11/09/14.  Patient needs to have labs done for the MRI.  Ena Dawley is asking for BUN/Creatinine ordered.  Please order labs.  Please fax lab results to  [phone].

## 2014-10-27 NOTE — Addendum Note (Signed)
Addended by: Ria Bush on: 10/27/2014 03:53 PM   Modules accepted: Orders

## 2014-10-27 NOTE — Telephone Encounter (Signed)
May come in next week for labs. Ordered.

## 2014-10-28 NOTE — Telephone Encounter (Signed)
Patient notified and lab appt scheduled.  

## 2014-11-02 ENCOUNTER — Other Ambulatory Visit: Payer: Self-pay | Admitting: Family Medicine

## 2014-11-02 DIAGNOSIS — R937 Abnormal findings on diagnostic imaging of other parts of musculoskeletal system: Secondary | ICD-10-CM

## 2014-11-02 NOTE — Addendum Note (Signed)
Addended by: Ellamae Sia on: 11/02/2014 12:40 PM   Modules accepted: Orders

## 2014-11-03 ENCOUNTER — Telehealth: Payer: Self-pay

## 2014-11-03 ENCOUNTER — Other Ambulatory Visit (INDEPENDENT_AMBULATORY_CARE_PROVIDER_SITE_OTHER): Payer: Commercial Managed Care - HMO

## 2014-11-03 DIAGNOSIS — R937 Abnormal findings on diagnostic imaging of other parts of musculoskeletal system: Secondary | ICD-10-CM

## 2014-11-03 DIAGNOSIS — R053 Chronic cough: Secondary | ICD-10-CM

## 2014-11-03 DIAGNOSIS — R05 Cough: Secondary | ICD-10-CM

## 2014-11-03 LAB — CREATININE, SERUM: CREATININE: 0.89 mg/dL (ref 0.40–1.20)

## 2014-11-03 LAB — BUN: BUN: 15 mg/dL (ref 6–23)

## 2014-11-03 NOTE — Telephone Encounter (Signed)
referral placed

## 2014-11-03 NOTE — Telephone Encounter (Signed)
Pt was seen 10/25/14; pt left note that pt stopped coughing for one week but has restarted coughing again; pt is still using inhaler and taking Humibid tabs for cough. Since pt is still coughing pt wants to know if Dr Darnell Level will do pulmonary referral that was discussed at 10/25/14 visit.Please advise.

## 2014-11-09 ENCOUNTER — Encounter: Payer: Self-pay | Admitting: Internal Medicine

## 2014-11-09 ENCOUNTER — Ambulatory Visit (INDEPENDENT_AMBULATORY_CARE_PROVIDER_SITE_OTHER): Payer: Commercial Managed Care - HMO | Admitting: Internal Medicine

## 2014-11-09 ENCOUNTER — Ambulatory Visit
Admission: RE | Admit: 2014-11-09 | Discharge: 2014-11-09 | Disposition: A | Discharge status: Home / Self Care | Payer: Commercial Managed Care - HMO | Source: Ambulatory Visit | Attending: Family Medicine | Admitting: Family Medicine

## 2014-11-09 VITALS — BP 156/76 | HR 74 | Temp 97.7°F | Ht 64.0 in | Wt 192.0 lb

## 2014-11-09 DIAGNOSIS — R05 Cough: Secondary | ICD-10-CM | POA: Insufficient documentation

## 2014-11-09 DIAGNOSIS — IMO0002 Reserved for concepts with insufficient information to code with codable children: Secondary | ICD-10-CM

## 2014-11-09 DIAGNOSIS — R937 Abnormal findings on diagnostic imaging of other parts of musculoskeletal system: Secondary | ICD-10-CM

## 2014-11-09 DIAGNOSIS — G4733 Obstructive sleep apnea (adult) (pediatric): Secondary | ICD-10-CM

## 2014-11-09 DIAGNOSIS — R059 Cough, unspecified: Secondary | ICD-10-CM | POA: Insufficient documentation

## 2014-11-09 DIAGNOSIS — J449 Chronic obstructive pulmonary disease, unspecified: Secondary | ICD-10-CM

## 2014-11-09 DIAGNOSIS — J45998 Other asthma: Secondary | ICD-10-CM

## 2014-11-09 MED ORDER — GADOBENATE DIMEGLUMINE 529 MG/ML IV SOLN
18.0000 mL | Freq: Once | INTRAVENOUS | Status: AC | PRN
  Administered 2014-11-09: 18 mL via INTRAVENOUS

## 2014-11-09 MED ORDER — TIOTROPIUM BROMIDE MONOHYDRATE 2.5 MCG/ACT IN AERS: INHALATION_SPRAY | RESPIRATORY_TRACT | Status: DC

## 2014-11-09 NOTE — Assessment & Plan Note (Signed)
Patient with past medical history of asthma, previously well controlled however since November last year have had 3 rounds of azithromycin followed by a course of steroids with little relief. Will plan for pulmonary function testing and 6 minute walk test. There is noted to be a small erythematous lesion in the left posterior aspect of her throat, I'm not sure if this is causing some postnasal drip with irritation and making her cough worse, will refer to ENT for further evaluation and recommendations.  Plan: -Continue with Advair gargle and rinse after each use -Pulmonary function testing in 6 minute walk test  -ENT followup for suspected upper airway cough syndrome, sinusitis, questionable throat lesion

## 2014-11-09 NOTE — Assessment & Plan Note (Signed)
Chronic cough, occurring since November 5183, cyclical in nature. Differential diagnosis includes upper airway cough syndrome/postnasal drip, sinusitis, allergies, throat irritation.  Plan: -Cough suppression therapy with tramadol and Delsym. -ENT followup.

## 2014-11-09 NOTE — Assessment & Plan Note (Signed)
Currently compliant, states that she's wearing her mask every night.

## 2014-11-09 NOTE — Progress Notes (Signed)
Date: 11/09/2014  MRN# [id number] Jennifer Pineda October 16, 1950  Referring Physician: Dr. Alphia Kava Jennifer Pineda is a 64 y.o. old female seen in consultation for chronic cough since November 2015  CC:  Chief Complaint  Patient presents with  . Advice Only    Pt has had cough/sob since Nov 2016. She has been on several abx. Pt has cough which is thick white mucus. Patient denies chest tightness.    HPI:  Patient is a pleasant 64 year old female presenting today with above chief complaint. Since November 2015 she was diagnosed with a "sinusitis" she had symptoms of nasal congestion clear to yellow at times throughout the course of her sinusitis she had 3 Z-Paks and 8 course of prednisone which improved the sinusitis nasal congestion however she remained with a persistent cough. Patient states that she can have a coughing spell that will last up to about 30 minutes at times, the cough is productive at times with thick white sputum. Her primary care physician has placed her on Advair, albuterol. Patient states she was given a trial of albuterol scheduled 3 times a day which did help, but cough returned after albuterol stopped. She is a former smoker, smoked for 19 years 2 pack per day quit in 1988. Has a history of asthma, hyperlipidemia, hypertension, obstructive sleep apnea on CPAP (which she states she's using every night.) Patient cannot identify any inciting factors in November besides sinusitis.   PMHX:   Past Medical History  Diagnosis Date  . GERD (gastroesophageal reflux disease)   . Essential hypertension   . Hypercholesteremia   . Anxiety and depression   . Hematuria 03/27/2001    FH, transitional cell CA of Kidney ? work up neg (Dr. Melodie Bouillon)  . Extrinsic asthma   . Allergic rhinitis   . HNP (herniated nucleus pulposus) 2013    R L5-S1 disc herniation with marked radiculopathy ([facility]) s/p laminectomy  . Abnormal MRI, spine 2013    w/u negative for metastatic  cancer (PET, pan CT, bone biopsy negative)  . OSA on CPAP    Surgical Hx:  Past Surgical History  Procedure Laterality Date  . Partial hysterectomy  1983    ovaries intact, endometriosis  . Abd ultrasound  02/2000    normal  . Esophagogastroduodenoscopy  03/2000    hiatal hernia  . Septoplasty  10/2000    Dr. Ernesto Rutherford, with windows (?)  . Appendectomy  1983  . Nsvd      x2, (breech, macro)  . Laminectomy and microdiscectomy lumbar spine  05/2012    for HNP ([facility])  . Colonoscopy  09/2012    small int hem Carlean Purl) rpt 10 yrs  . Hand surgery Right     trigger finger  . Dexa  06/2014    WNL (T 0.8)   Family Hx:  Family History  Problem Relation Age of Onset  . Cancer Mother     kidney and lymphoma CA, neprectomy tumor of lung, non smoker  . Kidney disease Mother     T-cell cancer in kidney  . Aneurysm Father     ? in throat  . Diabetes Father   . Heart disease Father     MI  . Diabetes Sister   . Hyperlipidemia Sister   . Heart disease Brother 54    MI  . Alcohol abuse Brother     smokes again  . Hypertension Brother   . Heart disease Maternal Aunt     MI  .  Cancer Brother 81    T-cell renal cancer/ removed  . Hypertension Brother   . Colon cancer Neg Hx   . Esophageal cancer Neg Hx   . Rectal cancer Neg Hx   . Stomach cancer Neg Hx    Social Hx:   History  Substance Use Topics  . Smoking status: Former Smoker -- 1.50 packs/day for 19 years    Quit date: 08/26/1986  . Smokeless tobacco: Never Used  . Alcohol Use: 1.0 oz/week    2 drink(s) per week     Comment: occassionally   Medication:   Current Outpatient Rx  Name  Route  Sig  Dispense  Refill  . albuterol (PROVENTIL HFA;VENTOLIN HFA) 108 (90 BASE) MCG/ACT inhaler   Inhalation   Inhale 2 puffs into the lungs every 6 (six) hours as needed. For shortness of breath.   54 Inhaler   3   . amLODipine (NORVASC) 10 MG tablet   Oral   Take 1 tablet (10 mg total) by mouth daily.   90 tablet   3   .  atorvastatin (LIPITOR) 10 MG tablet      TAKE ONE TABLET BY MOUTH AT BEDTIME   90 tablet   3   . FLUoxetine (PROZAC) 10 MG capsule      TAKE ONE CAPSULE BY MOUTH ONCE DAILY   90 capsule   3     Use this dose   . fluticasone (FLONASE) 50 MCG/ACT nasal spray   Each Nare   Place 2 sprays into both nostrils daily.   48 g   3   . Fluticasone-Salmeterol (ADVAIR) 500-50 MCG/DOSE AEPB   Inhalation   Inhale 1 puff into the lungs 2 (two) times daily.   60 each   3   . guaifenesin (HUMIBID E) 400 MG TABS   Oral   Take 400 mg by mouth daily.         Marland Kitchen loratadine (CLARITIN) 10 MG tablet      TAKE ONE TABLET BY MOUTH ONCE DAILY   30 tablet   11   . MENEST 0.3 MG tablet      TAKE ONE TABLET BY MOUTH ONCE DAILY   90 tablet   1   . omeprazole (PRILOSEC) 40 MG capsule      TAKE ONE CAPSULE BY MOUTH IN THE MORNING. TAKE 45 MINUTES PRIOR TO BREAKFAST   90 capsule   3   . traMADol (ULTRAM) 50 MG tablet   Oral   Take 0.5-1 tablets (25-50 mg total) by mouth every 8 (eight) hours as needed (sedation precautions).   30 tablet   0   . triamterene-hydrochlorothiazide (MAXZIDE-25) 37.5-25 MG per tablet      TAKE ONE TABLET BY MOUTH ONCE DAILY   90 tablet   3       Allergies:  Levofloxacin; Ampicillin; Doxycycline hyclate; Metoprolol succinate; and Tetracycline hcl  Review of Systems: Gen:  Denies  fever, sweats, chills HEENT: Denies blurred vision, double vision, ear pain, eye pain, hearing loss, nose bleeds, sore throat Cvc:  No dizziness, chest pain or heaviness Resp:   Admits to cough with mild intermittent thick sputum at times Gi: Denies swallowing difficulty, stomach pain, nausea or vomiting, diarrhea, constipation, bowel incontinence Gu:  Denies bladder incontinence, burning urine Ext:   No Joint pain, stiffness or swelling Skin: No skin rash, easy bruising or bleeding or hives Endoc:  No polyuria, polydipsia , polyphagia or weight change Psych: No depression,  insomnia  or hallucinations  Other:  All other systems negative  Physical Examination:   VS: BP 156/76 mmHg  Pulse 74  Temp(Src) 97.7 F (36.5 C) (Oral)  Ht _0  (1.626 m)  Wt 192 lb (87.091 kg)  BMI 32.94 kg/m2  SpO2 96%  General Appearance: No distress  Neuro:without focal findings, mental status, speech normal, alert and oriented, cranial nerves 2-12 intact, reflexes normal and symmetric, sensation grossly normal  HEENT: PERRLA, EOM intact, no ptosis, ; Mallampati 2. Throat: Moderate oropharyngeal erythema mild to moderate swelling noted on the left oropharyngeal side. Pulmonary: coarse upper airway sounds with transmission to the lungs,diaphragmatic excursion normal.No wheezing, No rales;   Sputum Production:none   CardiovascularNormal S1,S2.  No m/r/g.  Abdominal aorta pulsation normal.    Abdomen: Benign, Soft, non-tender, No masses, hepatosplenomegaly, No lymphadenopathy Renal:  No costovertebral tenderness  GU:  No performed at this time. Skin:   warm, no rashes, no ecchymosis  Extremities: normal, no cyanosis, clubbing, no edema, warm with normal capillary refill. Other findings:none  Rad results:    EKG:  (The following images and results were reviewed by Dr. Stevenson Clinch). CXR 09/2104 Stable chronic biapical pleural parenchymal scarring. Thoracic spondylosis. Mild enlargement of the cardiopericardial silhouette. No edema. No pleural effusion. Abdominal aortic atherosclerotic calcification visible on the lateral projection.  The lungs appear otherwise clear.  IMPRESSION: 1. Mild enlargement of the cardiopericardial silhouette, without edema. 2. Chronic biapical pleural parenchymal scarring, considered benign. 3. Thoracic spondylosis. 4. Abdominal aortic atherosclerotic calcification.  2013 CT Chest\Abd\Pelvis *RADIOLOGY REPORT*  Clinical Data: Bone lesions seen on recent MRI.  CT CHEST, ABDOMEN AND PELVIS WITH CONTRAST  Technique: Multidetector CT imaging of  the chest, abdomen and pelvis was performed following the standard protocol during bolus administration of intravenous contrast.  Contrast: 166m OMNIPAQUE IOHEXOL 300 MG/ML SOLN  Comparison: None.  CT CHEST  Findings: The chest wall is unremarkable. No obvious breast mass, supraclavicular or axillary lymphadenopathy. Small scattered axillary lymph nodes are noted bilaterally. The thyroid gland appears normal.  Examination of the bony thorax demonstrates no obvious lytic or sclerotic bone lesions. However, the lumbar lesions demonstrated on the MRI are difficult to discern on the CT scan and there could be metastatic disease in the thoracic spine.  The heart is normal in size. No pericardial effusion. No mediastinal or hilar lymphadenopathy. The esophagus is grossly normal. The aorta is normal in caliber. No dissection.  Examination of the lung parenchyma demonstrates no acute pulmonary findings. No pulmonary mass lesions or worrisome pulmonary nodules. No pleural effusion. The tracheobronchial tree appears normal.  IMPRESSION: No CT findings for a primary thoracic tumor or metastatic disease.  CT ABDOMEN AND PELVIS  Findings: The liver demonstrates mild diffuse fatty infiltration. No focal hepatic lesions or intrahepatic ductal dilatation. The portal and hepatic veins are patent. The gallbladder is normal. No common bile duct dilatation. The pancreas is normal. The spleen is normal. The adrenal glands and kidneys are normal.  The stomach, duodenum, small bowel and colon are unremarkable. No mass lesion or inflammatory change. No mesenteric or retroperitoneal masses or adenopathy. The aorta demonstrates moderate atherosclerotic changes but no aneurysm or dissection.  The uterus is surgically absent. Both ovaries are still present and appear normal. The bladder is normal. No pelvic mass, adenopathy or free pelvic fluid collections.  No inguinal mass or adenopathy.  Examination of the bony structures demonstrates suspected right sacral lesions. The vertebral body lesion and L3. Mainly permits. The  IMPRESSION:  1. No  CT findings for a primary abdominal/pelvic tumor or metastatic disease. 2. The lumbar vertebral body lesions are difficult to see on CT. The L3 lesion is the most conspicuous and appears permeative. 3. Suspect right-sided sacral lesions. 4. Multiple myeloma would be a definite consideration. Bone biopsy is recommended.    Assessment and Plan:2-year-old female with chronic cough since November 2015 seen for further evaluation. Persistent asthma Patient with past medical history of asthma, previously well controlled however since November last year have had 3 rounds of azithromycin followed by a course of steroids with little relief. Will plan for pulmonary function testing and 6 minute walk test. There is noted to be a small erythematous lesion in the left posterior aspect of her throat, I'm not sure if this is causing some postnasal drip with irritation and making her cough worse, will refer to ENT for further evaluation and recommendations.  Plan: -Continue with Advair gargle and rinse after each use -Pulmonary function testing in 6 minute walk test  -ENT followup for suspected upper airway cough syndrome, sinusitis, questionable throat lesion   Obstructive sleep apnea Currently compliant, states that she's wearing her mask every night.   COPD, mild Given her history of cough, prior smoking history, and current symptoms-patient clinically fits COPD.  Plan: -Pulmonary function testing and 6 minute walk test -Continue Advair -Avoid secondhand smoke exposure    Cough Chronic cough, occurring since November 1941, cyclical in nature. Differential diagnosis includes upper airway cough syndrome/postnasal drip, sinusitis, allergies, throat irritation.  Plan: -Cough suppression  therapy with tramadol and Delsym. -ENT followup.     Updated Medication List Outpatient Encounter Prescriptions as of 11/09/2014  Medication Sig  . albuterol (PROVENTIL HFA;VENTOLIN HFA) 108 (90 BASE) MCG/ACT inhaler Inhale 2 puffs into the lungs every 6 (six) hours as needed. For shortness of breath.  Marland Kitchen amLODipine (NORVASC) 10 MG tablet Take 1 tablet (10 mg total) by mouth daily.  Marland Kitchen atorvastatin (LIPITOR) 10 MG tablet TAKE ONE TABLET BY MOUTH AT BEDTIME  . FLUoxetine (PROZAC) 10 MG capsule TAKE ONE CAPSULE BY MOUTH ONCE DAILY  . fluticasone (FLONASE) 50 MCG/ACT nasal spray Place 2 sprays into both nostrils daily.  . Fluticasone-Salmeterol (ADVAIR) 500-50 MCG/DOSE AEPB Inhale 1 puff into the lungs 2 (two) times daily.  Marland Kitchen guaifenesin (HUMIBID E) 400 MG TABS Take 400 mg by mouth daily.  Marland Kitchen loratadine (CLARITIN) 10 MG tablet TAKE ONE TABLET BY MOUTH ONCE DAILY  . MENEST 0.3 MG tablet TAKE ONE TABLET BY MOUTH ONCE DAILY  . omeprazole (PRILOSEC) 40 MG capsule TAKE ONE CAPSULE BY MOUTH IN THE MORNING. TAKE 45 MINUTES PRIOR TO BREAKFAST  . traMADol (ULTRAM) 50 MG tablet Take 0.5-1 tablets (25-50 mg total) by mouth every 8 (eight) hours as needed (sedation precautions).  . triamterene-hydrochlorothiazide (MAXZIDE-25) 37.5-25 MG per tablet TAKE ONE TABLET BY MOUTH ONCE DAILY  . Tiotropium Bromide Monohydrate (SPIRIVA RESPIMAT) 2.5 MCG/ACT AERS Inhale 2 puffs every morning. Rinse/gargle after each use.  . [DISCONTINUED] Tiotropium Bromide Monohydrate (SPIRIVA RESPIMAT) 2.5 MCG/ACT AERS Inhale 2 puffs every morning. Rinse/gargle after each use.    Orders for this visit: Orders Placed This Encounter  Procedures  . Pulmonary function test    Standing Status: Future     Number of Occurrences:      Standing Expiration Date: 11/09/2015    Scheduling Instructions:     [facility]    Order Specific Question:  Where should this test be performed?    Answer:  [facility]  Order Specific  Question:  Full PFT: includes the following: basic spirometry, spirometry pre & post bronchodilator, diffusion capacity (DLCO), lung volumes    Answer:  Full PFT    Order Specific Question:  MIP/MEP    Answer:  No    Order Specific Question:  6 minute walk    Answer:  Yes    Order Specific Question:  ABG    Answer:  No    Order Specific Question:  Diffusion capacity (DLCO)    Answer:  No    Order Specific Question:  Lung volumes    Answer:  No    Order Specific Question:  Methacholine challenge    Answer:  No     Thank  you for the consultation and for allowing [facility] to assist in the care of your patient. Our recommendations are noted above.  Please contact us if we can be of further service.   Vilinda Boehringer, MD [facility] Office Number: [phone]

## 2014-11-09 NOTE — Assessment & Plan Note (Signed)
Given her history of cough, prior smoking history, and current symptoms-patient clinically fits COPD.  Plan: -Pulmonary function testing and 6 minute walk test -Continue Advair -Avoid secondhand smoke exposure

## 2014-11-09 NOTE — Patient Instructions (Signed)
Follow up with Dr. Stevenson Clinch in 1 month - pulmonary function testing and 6 minute walk test prior to follow up - please follow up with Dr. Pryor Ochoa office (ENT) for recurrent sinusitis and throat swelling in the back left.  - Continue with your Advair - please rinse and gargle after each use - we will start you on Spiriva Respimat - 2 puffs every morning  - please rinse and gargle after each use  Cough Suppression Therapy: Take delsym two tsp every 12 hours and supplement if needed with  tramadol 50 mg up to 2 every 4 hours to suppress the urge to cough. Swallowing water or using ice chips/non mint and menthol containing candies (such as lifesavers or sugarless jolly ranchers) are also effective.  You should rest your voice and avoid activities that you know make you cough.  Once you have eliminated the cough for 3 straight days try reducing the tramadol first,  then the delsym as tolerated.

## 2014-11-10 ENCOUNTER — Other Ambulatory Visit: Payer: Self-pay | Admitting: Family Medicine

## 2014-11-10 ENCOUNTER — Telehealth: Payer: Self-pay | Admitting: *Deleted

## 2014-11-10 DIAGNOSIS — J392 Other diseases of pharynx: Secondary | ICD-10-CM

## 2014-11-10 NOTE — Telephone Encounter (Signed)
Dr. Stevenson Clinch asked me to call patient and remind her to call [facility] for appt. Pt has not been there in over 3 years and would be considered a new patient. She has [employer] ins which requires all referral come from primary care. Pt has contacted pcp and asked that they send referral. I told her to call us back in the next couple of days if she has trouble getting appointment.

## 2015-01-03 ENCOUNTER — Ambulatory Visit (INDEPENDENT_AMBULATORY_CARE_PROVIDER_SITE_OTHER)
Admission: RE | Admit: 2015-01-03 | Discharge: 2015-01-03 | Disposition: A | Discharge status: Home / Self Care | Payer: Commercial Managed Care - HMO | Source: Ambulatory Visit | Attending: Family Medicine | Admitting: Family Medicine

## 2015-01-03 ENCOUNTER — Ambulatory Visit (INDEPENDENT_AMBULATORY_CARE_PROVIDER_SITE_OTHER): Payer: Commercial Managed Care - HMO | Admitting: Family Medicine

## 2015-01-03 ENCOUNTER — Encounter: Payer: Self-pay | Admitting: Family Medicine

## 2015-01-03 VITALS — BP 128/70 | HR 57 | Temp 98.3°F | Ht 64.0 in | Wt 194.0 lb

## 2015-01-03 DIAGNOSIS — M25561 Pain in right knee: Secondary | ICD-10-CM | POA: Diagnosis not present

## 2015-01-03 DIAGNOSIS — M6530 Trigger finger, unspecified finger: Secondary | ICD-10-CM

## 2015-01-03 MED ORDER — NAPROXEN 500 MG PO TABS: ORAL_TABLET | ORAL | Status: DC

## 2015-01-03 NOTE — Assessment & Plan Note (Signed)
Referred back to Dr Tamala Julian to discuss rpt steroid injection.

## 2015-01-03 NOTE — Assessment & Plan Note (Signed)
Anticipate meniscal tear + LCL sprain leading to knee effusion. Xray today given ongoing sxs. Treat with ice, elevation, naprosyn course. Discussed PT vs referral to ortho.  As we are referring to ortho for eval of trigger finger pt requests referral for further eval for knee as well.

## 2015-01-03 NOTE — Progress Notes (Signed)
BP 128/70 mmHg  Pulse 57  Temp(Src) 98.3 F (36.8 C) (Tympanic)  Ht 5\' 4"  (1.626 m)  Wt 194 lb (87.998 kg)  BMI 33.28 kg/m2  SpO2 98%   CC: R knee pain, trigger finger.  Subjective:    Patient ID: Jennifer Pineda, female    DOB: 11/08/1950, 64 y.o.   MRN: [id number]  HPI: Jennifer Pineda is a 64 y.o. female presenting on 01/03/2015 for right knee pain and tigger finger   Chronic cough now resolved - saw pulm and ENT.   R knee pain and swelling ongoing over last several months, now with worse pulling pain medially, some swelling especially noted anteriorly. So far has tried aleve prn and elevates leg. More swollen today after she helped son with yardwork over weekend. Denies inciting trauma/falls or injury to knee.   Also with L 4th trigger finger intermittently bothersome over last several . S/p steroid shot 04/2014 which helped for 8 months.   Relevant past medical, surgical, family and social history reviewed and updated as indicated. Interim medical history since our last visit reviewed. Allergies and medications reviewed and updated. Current Outpatient Prescriptions on File Prior to Visit  Medication Sig  . albuterol (PROVENTIL HFA;VENTOLIN HFA) 108 (90 BASE) MCG/ACT inhaler Inhale 2 puffs into the lungs every 6 (six) hours as needed. For shortness of breath.  Marland Kitchen amLODipine (NORVASC) 10 MG tablet Take 1 tablet (10 mg total) by mouth daily.  Marland Kitchen atorvastatin (LIPITOR) 10 MG tablet TAKE ONE TABLET BY MOUTH AT BEDTIME  . FLUoxetine (PROZAC) 10 MG capsule TAKE ONE CAPSULE BY MOUTH ONCE DAILY  . fluticasone (FLONASE) 50 MCG/ACT nasal spray Place 2 sprays into both nostrils daily.  . Fluticasone-Salmeterol (ADVAIR) 500-50 MCG/DOSE AEPB Inhale 1 puff into the lungs 2 (two) times daily.  Marland Kitchen guaifenesin (HUMIBID E) 400 MG TABS Take 400 mg by mouth daily.  Marland Kitchen loratadine (CLARITIN) 10 MG tablet TAKE ONE TABLET BY MOUTH ONCE DAILY  . MENEST 0.3 MG tablet TAKE ONE TABLET BY MOUTH ONCE  DAILY  . omeprazole (PRILOSEC) 40 MG capsule TAKE ONE CAPSULE BY MOUTH IN THE MORNING. TAKE 45 MINUTES PRIOR TO BREAKFAST  . Tiotropium Bromide Monohydrate (SPIRIVA RESPIMAT) 2.5 MCG/ACT AERS Inhale 2 puffs every morning. Rinse/gargle after each use.  . triamterene-hydrochlorothiazide (MAXZIDE-25) 37.5-25 MG per tablet TAKE ONE TABLET BY MOUTH ONCE DAILY   No current facility-administered medications on file prior to visit.    Review of Systems Per HPI unless specifically indicated above     Objective:    BP 128/70 mmHg  Pulse 57  Temp(Src) 98.3 F (36.8 C) (Tympanic)  Ht 5\' 4"  (1.626 m)  Wt 194 lb (87.998 kg)  BMI 33.28 kg/m2  SpO2 98%  Wt Readings from Last 3 Encounters:  01/03/15 194 lb (87.998 kg)  11/09/14 192 lb (87.091 kg)  10/25/14 194 lb 6.4 oz (88.179 kg)    Physical Exam  Constitutional: She appears well-developed and well-nourished. No distress.  Musculoskeletal: She exhibits no edema.  Persistent L trigger finger Legs: No palpable cords L knee WNL R Knee exam: No deformity on inspection. Tender with palpation peripatellar tendon and at LCL. + swelling at knee joint noted. FROM in flex/extension without crepitus. No popliteal fullness. Neg drawer test. + mcmurray test. No pain with valgus/varus stress. + PFgrind. Tight patellar mobility.   Skin: Skin is warm and dry. No rash noted.  Nursing note and vitals reviewed.  Results for orders placed or performed in visit  on 11/03/14  BUN  Result Value Ref Range   BUN 15 6 - 23 mg/dL  Creatinine, serum  Result Value Ref Range   Creatinine, Ser 0.89 0.40 - 1.20 mg/dL      Assessment & Plan:   Problem List Items Addressed This Visit    Right knee pain - Primary    Anticipate meniscal tear + LCL sprain leading to knee effusion. Xray today given ongoing sxs. Treat with ice, elevation, naprosyn course. Discussed PT vs referral to ortho.  As we are referring to ortho for eval of trigger finger pt requests  referral for further eval for knee as well.       Relevant Orders   DG Knee Complete 4 Views Right   Ambulatory referral to Orthopedic Surgery   Left trigger finger    Referred back to Dr Tamala Julian to discuss rpt steroid injection.      Relevant Orders   Ambulatory referral to Orthopedic Surgery       Follow up plan: Return as needed.

## 2015-01-03 NOTE — Patient Instructions (Addendum)
We will refer you back to Dr Tamala Julian for trigger finger and for right knee. I think you have meniscal tear plus arthritis - xray today. Start naprosyn anti inflammatory with food. Ice to knee, elevate knee and let us know if any worsening

## 2015-01-03 NOTE — Progress Notes (Signed)
Pre visit review using our clinic review tool, if applicable. No additional management support is needed unless otherwise documented below in the visit note. 

## 2015-02-01 ENCOUNTER — Ambulatory Visit (INDEPENDENT_AMBULATORY_CARE_PROVIDER_SITE_OTHER): Payer: Commercial Managed Care - HMO | Admitting: Internal Medicine

## 2015-02-01 ENCOUNTER — Encounter: Payer: Self-pay | Admitting: Internal Medicine

## 2015-02-01 VITALS — BP 150/82 | HR 78 | Ht 64.0 in | Wt 197.0 lb

## 2015-02-01 DIAGNOSIS — J449 Chronic obstructive pulmonary disease, unspecified: Secondary | ICD-10-CM

## 2015-02-01 DIAGNOSIS — J45998 Other asthma: Secondary | ICD-10-CM | POA: Diagnosis not present

## 2015-02-01 DIAGNOSIS — R05 Cough: Secondary | ICD-10-CM

## 2015-02-01 DIAGNOSIS — R059 Cough, unspecified: Secondary | ICD-10-CM

## 2015-02-01 DIAGNOSIS — R0602 Shortness of breath: Secondary | ICD-10-CM

## 2015-02-01 DIAGNOSIS — IMO0002 Reserved for concepts with insufficient information to code with codable children: Secondary | ICD-10-CM

## 2015-02-01 LAB — PULMONARY FUNCTION TEST
DL/VA % pred: 97 %
DL/VA: 4.68 ml/min/mmHg/L
DLCO UNC % PRED: 85 %
DLCO UNC: 20.75 ml/min/mmHg
FEF 25-75 PRE: 2.5 L/s
FEF 25-75 Post: 0.7 L/sec
FEF2575-%Change-Post: -72 %
FEF2575-%Pred-Post: 33 %
FEF2575-%Pred-Pre: 119 %
FEV1-%CHANGE-POST: -48 %
FEV1-%PRED-PRE: 90 %
FEV1-%Pred-Post: 46 %
FEV1-Post: 1.11 L
FEV1-Pre: 2.15 L
FEV1FVC-%Change-Post: -48 %
FEV1FVC-%Pred-Pre: 105 %
FEV6-%Change-Post: 4 %
FEV6-%PRED-PRE: 83 %
FEV6-%Pred-Post: 87 %
FEV6-POST: 2.62 L
FEV6-Pre: 2.51 L
FEV6FVC-%Change-Post: 0 %
FEV6FVC-%PRED-PRE: 104 %
FEV6FVC-%Pred-Post: 103 %
FVC-%Change-Post: 0 %
FVC-%Pred-Post: 83 %
FVC-%Pred-Pre: 83 %
FVC-POST: 2.63 L
FVC-PRE: 2.64 L
PRE FEV1/FVC RATIO: 82 %
PRE FEV6/FVC RATIO: 100 %
Post FEV1/FVC ratio: 42 %
Post FEV6/FVC ratio: 100 %
RV % PRED: 116 %
RV: 2.45 L
TLC % pred: 102 %
TLC: 5.2 L

## 2015-02-01 NOTE — Progress Notes (Signed)
SMW performed today. 

## 2015-02-01 NOTE — Patient Instructions (Addendum)
Follow up with Dr. Stevenson Clinch 4 months - complete your current Advair dose, then stop, if symptoms of COPD return, then call us and we will call in a rx for symbicort.  - cont with spiriva - Please rinse and gargle after each use of Advair and spiriva - weight, diet, and exercise

## 2015-02-01 NOTE — Progress Notes (Signed)
MRN# [id number] Jennifer Pineda 02-11-1951   CC: Chief Complaint  Patient presents with  . Follow-up    PFT/SMW results;      Brief History: HPI 10/2014 Patient is a pleasant 64 year old female presenting today with above chief complaint. Since November 2015 she was diagnosed with a "sinusitis" she had symptoms of nasal congestion clear to yellow at times throughout the course of her sinusitis she had 3 Z-Paks and 8 course of prednisone which improved the sinusitis nasal congestion however she remained with a persistent cough. Patient states that she can have a coughing spell that will last up to about 30 minutes at times, the cough is productive at times with thick white sputum. Her primary care physician has placed her on Advair, albuterol. Patient states she was given a trial of albuterol scheduled 3 times a day which did help, but cough returned after albuterol stopped. She is a former smoker, smoked for 19 years 2 pack per day quit in 1988. Has a history of asthma, hyperlipidemia, hypertension, obstructive sleep apnea on CPAP (which she states she's using every night.) Patient cannot identify any inciting factors in November besides sinusitis.  Plan - ENT follow, Advair\spiriva\wt loss  Events since last clinic visit: States that she is doing well overall. Saw ENT since last visit, was dx with internal nasal cellulitis (given gentamicin 0.1% topical ointment and Bactoban 2% topical ointment), for which she is still taking meds for.  Also, ENT diagnosed her with chronic pharyngitis, which could be related to her COPD inhalers (Advair and Spiriva) Overall doing much better, no significant cough or shortness of breath.  Patient had her PFTs and 6 MWT test done day.      Medication:   Current Outpatient Rx  Name  Route  Sig  Dispense  Refill  . albuterol (PROVENTIL HFA;VENTOLIN HFA) 108 (90 BASE) MCG/ACT inhaler   Inhalation   Inhale 2 puffs into the lungs every 6 (six) hours as  needed. For shortness of breath.   54 Inhaler   3   . amLODipine (NORVASC) 10 MG tablet   Oral   Take 1 tablet (10 mg total) by mouth daily.   90 tablet   3   . atorvastatin (LIPITOR) 10 MG tablet      TAKE ONE TABLET BY MOUTH AT BEDTIME   90 tablet   3   . FLUoxetine (PROZAC) 10 MG capsule      TAKE ONE CAPSULE BY MOUTH ONCE DAILY   90 capsule   3     Use this dose   . fluticasone (FLONASE) 50 MCG/ACT nasal spray   Each Nare   Place 2 sprays into both nostrils daily.   48 g   3   . Fluticasone-Salmeterol (ADVAIR) 500-50 MCG/DOSE AEPB   Inhalation   Inhale 1 puff into the lungs 2 (two) times daily.   60 each   3   . guaifenesin (HUMIBID E) 400 MG TABS   Oral   Take 400 mg by mouth daily.         Marland Kitchen loratadine (CLARITIN) 10 MG tablet      TAKE ONE TABLET BY MOUTH ONCE DAILY   30 tablet   11   . MENEST 0.3 MG tablet      TAKE ONE TABLET BY MOUTH ONCE DAILY   90 tablet   1   . naproxen (NAPROSYN) 500 MG tablet      Take one po bid x 1 week then prn  pain, take with food   40 tablet   0   . omeprazole (PRILOSEC) 40 MG capsule      TAKE ONE CAPSULE BY MOUTH IN THE MORNING. TAKE 45 MINUTES PRIOR TO BREAKFAST   90 capsule   3   . Tiotropium Bromide Monohydrate (SPIRIVA RESPIMAT) 2.5 MCG/ACT AERS      Inhale 2 puffs every morning. Rinse/gargle after each use.   4 g   5   . triamterene-hydrochlorothiazide (MAXZIDE-25) 37.5-25 MG per tablet      TAKE ONE TABLET BY MOUTH ONCE DAILY   90 tablet   3      Review of Systems: Gen:  Denies  fever, sweats, chills HEENT: Denies blurred vision, double vision, ear pain, eye pain, hearing loss, nose bleeds, sore throat Cvc:  No dizziness, chest pain or heaviness Resp:   No cough, no shortness of breath Gi: Denies swallowing difficulty, stomach pain, nausea or vomiting, diarrhea, constipation, bowel incontinence Gu:  Denies bladder incontinence, burning urine Ext:   No Joint pain, stiffness or  swelling Skin: No skin rash, easy bruising or bleeding or hives Endoc:  No polyuria, polydipsia , polyphagia or weight change Other:  All other systems negative  Allergies:  Levofloxacin; Ampicillin; Doxycycline hyclate; Metoprolol succinate; and Tetracycline hcl  Physical Examination:  VS: BP 150/82 mmHg  Pulse 78  Ht 5\' 4"  (1.626 m)  Wt 197 lb (89.359 kg)  BMI 33.80 kg/m2  SpO2 95%  General Appearance: No distress  HEENT: PERRLA, no ptosis, no other lesions noticed Pulmonary:normal breath sounds., diaphragmatic excursion normal.No wheezing, No rales   Cardiovascular:  Normal S1,S2.  No m/r/g.     Abdomen:Exam: Benign, Soft, non-tender, No masses  Skin:   warm, no rashes, no ecchymosis  Extremities: normal, no cyanosis, clubbing, warm with normal capillary refill.      PFTs 02/01/2015: FVC 82% FEV1 90% FEV1/FVC 82% RV 116 TLC 102 RV/TLC 112 ERV 28 DLCO uncorrected 85 Impression: Essentially normal PFTs, no significant obstruction or restriction noted. Severe reduction in ERV, abdominal obesity or chest wall obesity can add to this. 6 minute walk test: 333 m/1092 feet, lowest saturation 93%, highest heart rate 79, no complaints after test.      Assessment and Plan:64 year old female seen in followup visit today for cough/COPD.  COPD, mild Given her history of cough, prior smoking history, and current symptoms-patient clinically fits COPD, however based on PFTs today there is no significant obstruction.   Clinically speaking no, she will continue to be treated as mild COPD on a symptomatic basis. We discussed also that Advair can cause cough given that it is a dry powder, cough is to get worse we may stop Advair or change to Symbicort.   Plan: -continue current dose of Advair, then stop as part of step down therapy. If COPD symptoms returned, will consider starting on Symbicort; patient had symptoms of cough possibly due to Advair. -Continue with Spiriva -Continue to  gargle and rinse after each use of bronchodilators -Continue with diet, weight loss, exercise -Avoid secondhand smoke exposure     Persistent asthma Patient with past medical history of asthma, previously well controlled however since November last year have had 3 rounds of azithromycin followed by a course of steroids with little relief. Now with normal pulmonary function testing. At her last visit there was is noted to be a small erythematous lesion in the left posterior aspect of her throat, which has since been evaluated by ENT, noted to be  sequelae of chronic pharyngitis. Also an ENT visit she was diagnosed with nasal cellulitis and given topical antibiotics for it.  Plan: - see plan for COPD.   Cough Chronic cough, occurring since November 1287, cyclical in nature. Differential diagnosis includes upper airway cough syndrome/postnasal drip, sinusitis, allergies, throat irritation.   patient assessment seen evaluated by ENT, she was noted to  Have nasal cellulitis along with chronic pharyngitis; possible sequelae of using chronic inhalers. Based on advice of ENT, will continue with Advair for now give a trial off. And advised patient to continue gargle and rinse in properly after each use of bronchodilators.  Plan: -continue with supportive care, -see plan for COPD.      Updated Medication List Outpatient Encounter Prescriptions as of 02/01/2015  Medication Sig  . albuterol (PROVENTIL HFA;VENTOLIN HFA) 108 (90 BASE) MCG/ACT inhaler Inhale 2 puffs into the lungs every 6 (six) hours as needed. For shortness of breath.  Marland Kitchen amLODipine (NORVASC) 10 MG tablet Take 1 tablet (10 mg total) by mouth daily.  Marland Kitchen atorvastatin (LIPITOR) 10 MG tablet TAKE ONE TABLET BY MOUTH AT BEDTIME  . FLUoxetine (PROZAC) 10 MG capsule TAKE ONE CAPSULE BY MOUTH ONCE DAILY  . fluticasone (FLONASE) 50 MCG/ACT nasal spray Place 2 sprays into both nostrils daily.  . Fluticasone-Salmeterol (ADVAIR) 500-50  MCG/DOSE AEPB Inhale 1 puff into the lungs 2 (two) times daily.  Marland Kitchen guaifenesin (HUMIBID E) 400 MG TABS Take 400 mg by mouth daily.  Marland Kitchen loratadine (CLARITIN) 10 MG tablet TAKE ONE TABLET BY MOUTH ONCE DAILY  . MENEST 0.3 MG tablet TAKE ONE TABLET BY MOUTH ONCE DAILY  . naproxen (NAPROSYN) 500 MG tablet Take one po bid x 1 week then prn pain, take with food  . omeprazole (PRILOSEC) 40 MG capsule TAKE ONE CAPSULE BY MOUTH IN THE MORNING. TAKE 45 MINUTES PRIOR TO BREAKFAST  . Tiotropium Bromide Monohydrate (SPIRIVA RESPIMAT) 2.5 MCG/ACT AERS Inhale 2 puffs every morning. Rinse/gargle after each use.  . triamterene-hydrochlorothiazide (MAXZIDE-25) 37.5-25 MG per tablet TAKE ONE TABLET BY MOUTH ONCE DAILY   No facility-administered encounter medications on file as of 02/01/2015.    Orders for this visit: No orders of the defined types were placed in this encounter.    Thank  you for the visitation and for allowing  [facility] to assist in the care of your patient. Our recommendations are noted above.  Please contact us if we can be of further service.  Vilinda Boehringer, MD [facility] Office Number: [phone]

## 2015-02-01 NOTE — Progress Notes (Signed)
PFT performed today. 

## 2015-02-08 NOTE — Assessment & Plan Note (Signed)
Chronic cough, occurring since November 1427, cyclical in nature. Differential diagnosis includes upper airway cough syndrome/postnasal drip, sinusitis, allergies, throat irritation.   patient assessment seen evaluated by ENT, she was noted to  Have nasal cellulitis along with chronic pharyngitis; possible sequelae of using chronic inhalers. Based on advice of ENT, will continue with Advair for now give a trial off. And advised patient to continue gargle and rinse in properly after each use of bronchodilators.  Plan: -continue with supportive care, -see plan for COPD.

## 2015-02-08 NOTE — Assessment & Plan Note (Signed)
Patient with past medical history of asthma, previously well controlled however since November last year have had 3 rounds of azithromycin followed by a course of steroids with little relief. Now with normal pulmonary function testing. At her last visit there was is noted to be a small erythematous lesion in the left posterior aspect of her throat, which has since been evaluated by ENT, noted to be sequelae of chronic pharyngitis. Also an ENT visit she was diagnosed with nasal cellulitis and given topical antibiotics for it.  Plan: - see plan for COPD.

## 2015-02-08 NOTE — Assessment & Plan Note (Addendum)
Given her history of cough, prior smoking history, and current symptoms-patient clinically fits COPD, however based on PFTs today there is no significant obstruction.   Clinically speaking no, she will continue to be treated as mild COPD on a symptomatic basis. We discussed also that Advair can cause cough given that it is a dry powder, cough is to get worse we may stop Advair or change to Symbicort.   Plan: -continue current dose of Advair, then stop as part of step down therapy. If COPD symptoms returned, will consider starting on Symbicort; patient had symptoms of cough possibly due to Advair. -Continue with Spiriva -Continue to gargle and rinse after each use of bronchodilators -Continue with diet, weight loss, exercise -Avoid secondhand smoke exposure

## 2015-04-05 ENCOUNTER — Other Ambulatory Visit: Payer: Self-pay | Admitting: Family Medicine

## 2015-05-19 ENCOUNTER — Other Ambulatory Visit: Payer: Self-pay | Admitting: Internal Medicine

## 2015-05-19 ENCOUNTER — Other Ambulatory Visit: Payer: Self-pay | Admitting: Family Medicine

## 2015-05-31 ENCOUNTER — Telehealth: Payer: Self-pay | Admitting: Internal Medicine

## 2015-05-31 ENCOUNTER — Encounter: Payer: Self-pay | Admitting: Internal Medicine

## 2015-05-31 ENCOUNTER — Ambulatory Visit (INDEPENDENT_AMBULATORY_CARE_PROVIDER_SITE_OTHER): Payer: Commercial Managed Care - HMO | Admitting: Internal Medicine

## 2015-05-31 VITALS — BP 142/70 | HR 89 | Temp 98.7°F | Wt 193.0 lb

## 2015-05-31 DIAGNOSIS — Z8051 Family history of malignant neoplasm of kidney: Secondary | ICD-10-CM | POA: Diagnosis not present

## 2015-05-31 DIAGNOSIS — R319 Hematuria, unspecified: Secondary | ICD-10-CM

## 2015-05-31 LAB — POCT URINALYSIS DIPSTICK
Bilirubin, UA: NEGATIVE
Glucose, UA: NEGATIVE
KETONES UA: NEGATIVE
LEUKOCYTES UA: NEGATIVE
Nitrite, UA: NEGATIVE
PH UA: 6
Protein, UA: NEGATIVE
Spec Grav, UA: 1.01
Urobilinogen, UA: NEGATIVE

## 2015-05-31 LAB — COMPREHENSIVE METABOLIC PANEL
ALBUMIN: 4.2 g/dL (ref 3.5–5.2)
ALK PHOS: 111 U/L (ref 39–117)
ALT: 51 U/L — ABNORMAL HIGH (ref 0–35)
AST: 43 U/L — ABNORMAL HIGH (ref 0–37)
BUN: 13 mg/dL (ref 6–23)
CALCIUM: 9.5 mg/dL (ref 8.4–10.5)
CO2: 31 mEq/L (ref 19–32)
Chloride: 98 mEq/L (ref 96–112)
Creatinine, Ser: 0.74 mg/dL (ref 0.40–1.20)
GFR: 83.43 mL/min (ref >60.00)
Glucose, Bld: 96 mg/dL (ref 70–99)
POTASSIUM: 4 meq/L (ref 3.5–5.1)
SODIUM: 137 meq/L (ref 135–145)
TOTAL PROTEIN: 8.1 g/dL (ref 6.0–8.3)
Total Bilirubin: 0.4 mg/dL (ref 0.2–1.2)

## 2015-05-31 LAB — CBC WITH DIFFERENTIAL/PLATELET
Basophils Absolute: 0.1 10*3/uL (ref 0.0–0.1)
Basophils Relative: 0.4 % (ref 0.0–3.0)
EOS PCT: 2.7 % (ref 0.0–5.0)
Eosinophils Absolute: 0.3 10*3/uL (ref 0.0–0.7)
HCT: 38 % (ref 36.0–46.0)
HEMOGLOBIN: 12.4 g/dL (ref 12.0–15.0)
Lymphocytes Relative: 25.3 % (ref 12.0–46.0)
Lymphs Abs: 3.3 10*3/uL (ref 0.7–4.0)
MCHC: 32.5 g/dL (ref 30.0–36.0)
MCV: 83 fl (ref 78.0–100.0)
MONOS PCT: 9.6 % (ref 3.0–12.0)
Monocytes Absolute: 1.2 10*3/uL — ABNORMAL HIGH (ref 0.1–1.0)
Neutro Abs: 8 10*3/uL — ABNORMAL HIGH (ref 1.4–7.7)
Neutrophils Relative %: 62 % (ref 43.0–77.0)
Platelets: 319 10*3/uL (ref 150.0–400.0)
RBC: 4.58 Mil/uL (ref 3.87–5.11)
RDW: 14.5 % (ref 11.5–15.5)
WBC: 12.9 10*3/uL — AB (ref 4.0–10.5)

## 2015-05-31 NOTE — Progress Notes (Signed)
HPI  Pt presents to the clinic today with c/o hematuria. She reports this started this morning, although it has occurred on 2 other occassions within the last month. She denies urgency, frequency or dysuria. She denies vaginal complaints and is s/p hysterectomy. She denies blood in her stool. She has no history of kidney stones. She does have a family history of kidney cancer in her mother and her brother. She is a former smoker.   Review of Systems  Past Medical History  Diagnosis Date  . GERD (gastroesophageal reflux disease)   . Essential hypertension   . Hypercholesteremia   . Anxiety and depression   . Hematuria 03/27/2001    FH, transitional cell CA of Kidney ? work up neg (Dr. Melodie Bouillon)  . Extrinsic asthma   . Allergic rhinitis   . HNP (herniated nucleus pulposus) 2013    R L5-S1 disc herniation with marked radiculopathy (Pool) s/p laminectomy  . Abnormal MRI, spine 2013    w/u negative for metastatic cancer (PET, pan CT, bone biopsy negative)  . OSA on CPAP     Family History  Problem Relation Age of Onset  . Cancer Mother     kidney and lymphoma CA, neprectomy tumor of lung, non smoker  . Kidney disease Mother     T-cell cancer in kidney  . Aneurysm Father     ? in throat  . Diabetes Father   . Heart disease Father     MI  . Diabetes Sister   . Hyperlipidemia Sister   . Heart disease Brother 22    MI  . Alcohol abuse Brother     smokes again  . Hypertension Brother   . Heart disease Maternal Aunt     MI  . Cancer Brother 35    T-cell renal cancer/ removed  . Hypertension Brother   . Colon cancer Neg Hx   . Esophageal cancer Neg Hx   . Rectal cancer Neg Hx   . Stomach cancer Neg Hx     Social History   Social History  . Marital Status: Divorced    Spouse Name: N/A  . Number of Children: 2  . Years of Education: N/A   Occupational History  . Data [employer]   Social History Main Topics  . Smoking status: Former Smoker  -- 1.50 packs/day for 19 years    Quit date: 08/26/1986  . Smokeless tobacco: Never Used  . Alcohol Use: 1.0 oz/week    2 drink(s) per week     Comment: occassionally  . Drug Use: No  . Sexual Activity: No   Other Topics Concern  . Not on file   Social History Narrative   Divorced   Daughter [name], lives with her   Children: 2 children // one daughter lives with mother/ son lives with father      Advanced directives - would like packet. Would want son [name]) to be HCPOA.          Allergies  Allergen Reactions  . Levofloxacin     REACTION: Swelling of throat, SOB  . Ampicillin     Diarrhea, stomach upset  . Doxycycline Hyclate     Diarrhea, stomach upset  . Metoprolol Succinate     Diarrhea, stomach upset  . Tetracycline Hcl     Diarrhea, stomach upset    Constitutional: Denies fever, malaise, fatigue, headache or abrupt weight changes.   GU: Pt reports blood  in urine. Denies urgency, frequency, dysuria, burning sensation, odor or discharge. Skin: Denies redness, rashes, lesions or ulcercations.   No other specific complaints in a complete review of systems (except as listed in HPI above).    Objective:   Physical Exam  BP 142/70 mmHg  Pulse 89  Temp(Src) 98.7 F (37.1 C) (Oral)  Wt 193 lb (87.544 kg)  SpO2 98% Wt Readings from Last 3 Encounters:  05/31/15 193 lb (87.544 kg)  02/01/15 197 lb (89.359 kg)  01/03/15 194 lb (87.998 kg)    General: Appears her stated age, well developed, well nourished in NAD. Cardiovascular: Normal rate and rhythm. S1,S2 noted.   Pulmonary/Chest: Normal effort and positive vesicular breath sounds. No respiratory distress. No wheezes, rales or ronchi noted.  Abdomen: Soft. Normal bowel sounds. No distention or masses noted. No CVA tenderness.      Assessment & Plan:   Painless hematuria, family history of kidney cancer:  Urinalysis: large blood Will send urine culture Drink plenty of fluids Will  check CBC and CMET today See Rosaria Ferries on the way out to schedule CT abd/pelvis to r/o malignancy  Will follow up after CT scan, RTC as needed or if symptoms persist.

## 2015-05-31 NOTE — Addendum Note (Signed)
Addended by: Lurlean Nanny on: 05/31/2015 04:02 PM   Modules accepted: Orders

## 2015-05-31 NOTE — Patient Instructions (Signed)

## 2015-05-31 NOTE — Telephone Encounter (Signed)
Last seen on 02/01/15 by VM Patient Instructions     Follow up with Dr. Stevenson Clinch 4 months - complete your current Advair dose, then stop, if symptoms of COPD return, then call us and we will call in a rx for symbicort.  - cont with spiriva - Please rinse and gargle after each use of Advair and spiriva - weight, diet, and exercise     Called and spoke with pt. Explained to her that she would need to contact her insurance company to get a copy of her formulary or for them to verbally review her formulary over the phone. Once she confirmed her formulary she could call us back and let us know what her insurance would approve.

## 2015-05-31 NOTE — Progress Notes (Signed)
Pre visit review using our clinic review tool, if applicable. No additional management support is needed unless otherwise documented below in the visit note. 

## 2015-06-01 NOTE — Telephone Encounter (Signed)
Dr Stevenson Clinch please advise if we can change pt's inhaler. Thanks.

## 2015-06-01 NOTE — Telephone Encounter (Signed)
[employer] is telling patient that Incruse Elipta would be cheaper for her.  She is requesting an RX for this. Please call patient [phone].

## 2015-06-02 ENCOUNTER — Ambulatory Visit
Admission: RE | Admit: 2015-06-02 | Discharge: 2015-06-02 | Disposition: A | Discharge status: Home / Self Care | Payer: Commercial Managed Care - HMO | Source: Ambulatory Visit | Attending: Internal Medicine | Admitting: Internal Medicine

## 2015-06-02 ENCOUNTER — Other Ambulatory Visit: Payer: Self-pay | Admitting: Internal Medicine

## 2015-06-02 DIAGNOSIS — N3289 Other specified disorders of bladder: Secondary | ICD-10-CM

## 2015-06-02 DIAGNOSIS — K760 Fatty (change of) liver, not elsewhere classified: Secondary | ICD-10-CM | POA: Diagnosis not present

## 2015-06-02 DIAGNOSIS — R319 Hematuria, unspecified: Secondary | ICD-10-CM

## 2015-06-02 DIAGNOSIS — I700 Atherosclerosis of aorta: Secondary | ICD-10-CM | POA: Diagnosis not present

## 2015-06-02 DIAGNOSIS — K5730 Diverticulosis of large intestine without perforation or abscess without bleeding: Secondary | ICD-10-CM | POA: Insufficient documentation

## 2015-06-02 DIAGNOSIS — I2510 Atherosclerotic heart disease of native coronary artery without angina pectoris: Secondary | ICD-10-CM | POA: Diagnosis not present

## 2015-06-02 DIAGNOSIS — Z90710 Acquired absence of both cervix and uterus: Secondary | ICD-10-CM | POA: Insufficient documentation

## 2015-06-02 DIAGNOSIS — Z8051 Family history of malignant neoplasm of kidney: Secondary | ICD-10-CM

## 2015-06-02 LAB — URINE CULTURE
Colony Count: NO GROWTH
Organism ID, Bacteria: NO GROWTH

## 2015-06-02 MED ORDER — IOHEXOL 300 MG/ML  SOLN
100.0000 mL | Freq: Once | INTRAMUSCULAR | Status: AC | PRN
  Administered 2015-06-02: 125 mL via INTRAVENOUS

## 2015-06-04 ENCOUNTER — Encounter: Payer: Self-pay | Admitting: Family Medicine

## 2015-06-06 NOTE — Telephone Encounter (Signed)
LMOM for pt to return call. Per Dr. Mortimer Fries, pt to stop Advair and Spiriva and start Incruse 62.5 and Breo 100 1 puff Daily. Will await pt's call before sending.

## 2015-06-06 NOTE — Telephone Encounter (Signed)
Misty, can you follow up on this?

## 2015-06-07 ENCOUNTER — Telehealth: Payer: Self-pay | Admitting: Family Medicine

## 2015-06-07 ENCOUNTER — Other Ambulatory Visit: Payer: Self-pay | Admitting: Urology

## 2015-06-07 MED ORDER — FLUTICASONE FUROATE-VILANTEROL 100-25 MCG/INH IN AEPB: 1.0000 | INHALATION_SPRAY | Freq: Every day | RESPIRATORY_TRACT | Status: DC

## 2015-06-07 MED ORDER — UMECLIDINIUM BROMIDE 62.5 MCG/INH IN AEPB: 1.0000 | INHALATION_SPRAY | Freq: Every day | RESPIRATORY_TRACT | Status: DC

## 2015-06-07 NOTE — Telephone Encounter (Signed)
Received message from patient that she had her Biopsy and they told her that it was not cancer. She wanted to maker sure that they sent you this info. She said she will be getting surgery and they would be setting that up.

## 2015-06-07 NOTE — Telephone Encounter (Signed)
Spoke with pt and informed of Dr. Zoila Shutter response. Pt agreed. Informed rx will be sent to pharmacy. Nothing further needed.

## 2015-06-07 NOTE — Telephone Encounter (Signed)
Great news. Will await records.

## 2015-06-09 ENCOUNTER — Telehealth: Payer: Self-pay | Admitting: Internal Medicine

## 2015-06-09 ENCOUNTER — Encounter: Payer: Self-pay | Admitting: Family Medicine

## 2015-06-09 NOTE — Telephone Encounter (Signed)
Spoke with pt. She has already spoken with [place] about this already. On 06/07/15, Incruse was sent in per Dr. Mortimer Fries. Advised pt that she needs to contact [facility] about her prescription. Nothing further was needed.

## 2015-07-04 ENCOUNTER — Encounter (HOSPITAL_BASED_OUTPATIENT_CLINIC_OR_DEPARTMENT_OTHER): Payer: Self-pay | Admitting: *Deleted

## 2015-07-04 NOTE — Progress Notes (Signed)
NPO AFTER MN.  ARRIVE AT 0600.  NEEDS ISTAT.  CURRENT EKG AND CXR IN CHART AND EPIC.  WILL TAKE AM MEDS/  SPIRIVA INHALER DOS W/ SIPS OF WATER WITH EXCEPTION NO MAXIDE.

## 2015-07-07 ENCOUNTER — Ambulatory Visit (INDEPENDENT_AMBULATORY_CARE_PROVIDER_SITE_OTHER): Payer: Commercial Managed Care - HMO | Admitting: Internal Medicine

## 2015-07-10 ENCOUNTER — Ambulatory Visit: Payer: Commercial Managed Care - HMO | Admitting: Internal Medicine

## 2015-07-10 NOTE — Anesthesia Preprocedure Evaluation (Addendum)
Anesthesia Evaluation  Patient identified by MRN, date of birth, ID band Patient awake    Reviewed: Allergy & Precautions, H&P , NPO status , Patient's Chart, lab work & pertinent test results  History of Anesthesia Complications (+) PONV  Airway Mallampati: II  TM Distance: >3 FB Neck ROM: full   Comment: Small mouth Dental no notable dental hx. (+) Dental Advisory Given, Teeth Intact   Pulmonary asthma , sleep apnea and Continuous Positive Airway Pressure Ventilation , COPD,  COPD inhaler, former smoker,  Severe OSA Chest x-ray 5-16 IMPRESSION: 1. Mild enlargement of the cardiopericardial silhouette, without edema. 2. Chronic biapical pleural parenchymal scarring, considered benign. 3. Thoracic spondylosis. 4. Abdominal aortic atherosclerotic calcification   Pulmonary exam normal breath sounds clear to auscultation       Cardiovascular Exercise Tolerance: Good hypertension, Pt. on medications + CAD  Normal cardiovascular exam Rhythm:regular Rate:Normal  CAD per CT 2016   Neuro/Psych negative neurological ROS  negative psych ROS   GI/Hepatic negative GI ROS, Neg liver ROS, hiatal hernia, GERD  Medicated and Controlled,  Endo/Other  negative endocrine ROS  Renal/GU negative Renal ROS  negative genitourinary   Musculoskeletal  (+) Arthritis , Osteoarthritis,    Abdominal   Peds  Hematology negative hematology ROS (+)   Anesthesia Other Findings   Reproductive/Obstetrics negative OB ROS                          Anesthesia Physical Anesthesia Plan  ASA: III  Anesthesia Plan: General   Post-op Pain Management:    Induction: Intravenous  Airway Management Planned: LMA  Additional Equipment:   Intra-op Plan:   Post-operative Plan:   Informed Consent: I have reviewed the patients History and Physical, chart, labs and discussed the procedure including the risks, benefits and  alternatives for the proposed anesthesia with the patient or authorized representative who has indicated his/her understanding and acceptance.   Dental Advisory Given  Plan Discussed with: CRNA, Surgeon and Anesthesiologist  Anesthesia Plan Comments:        Anesthesia Quick Evaluation

## 2015-07-11 ENCOUNTER — Ambulatory Visit (HOSPITAL_BASED_OUTPATIENT_CLINIC_OR_DEPARTMENT_OTHER)
Admission: RE | Admit: 2015-07-11 | Discharge: 2015-07-11 | Disposition: A | Discharge status: Home / Self Care | Payer: Commercial Managed Care - HMO | Source: Ambulatory Visit | Attending: Urology | Admitting: Urology

## 2015-07-11 ENCOUNTER — Encounter (HOSPITAL_BASED_OUTPATIENT_CLINIC_OR_DEPARTMENT_OTHER)
Admission: RE | Disposition: A | Discharge status: Home / Self Care | Payer: Self-pay | Source: Ambulatory Visit | Attending: Urology

## 2015-07-11 ENCOUNTER — Encounter (HOSPITAL_BASED_OUTPATIENT_CLINIC_OR_DEPARTMENT_OTHER): Payer: Self-pay | Admitting: *Deleted

## 2015-07-11 ENCOUNTER — Ambulatory Visit (HOSPITAL_BASED_OUTPATIENT_CLINIC_OR_DEPARTMENT_OTHER): Payer: Commercial Managed Care - HMO | Admitting: Anesthesiology

## 2015-07-11 ENCOUNTER — Telehealth: Payer: Self-pay | Admitting: Internal Medicine

## 2015-07-11 DIAGNOSIS — J449 Chronic obstructive pulmonary disease, unspecified: Secondary | ICD-10-CM | POA: Diagnosis not present

## 2015-07-11 DIAGNOSIS — Z841 Family history of disorders of kidney and ureter: Secondary | ICD-10-CM | POA: Insufficient documentation

## 2015-07-11 DIAGNOSIS — K219 Gastro-esophageal reflux disease without esophagitis: Secondary | ICD-10-CM | POA: Diagnosis not present

## 2015-07-11 DIAGNOSIS — M19042 Primary osteoarthritis, left hand: Secondary | ICD-10-CM | POA: Insufficient documentation

## 2015-07-11 DIAGNOSIS — I2510 Atherosclerotic heart disease of native coronary artery without angina pectoris: Secondary | ICD-10-CM | POA: Insufficient documentation

## 2015-07-11 DIAGNOSIS — Z7951 Long term (current) use of inhaled steroids: Secondary | ICD-10-CM | POA: Diagnosis not present

## 2015-07-11 DIAGNOSIS — M19041 Primary osteoarthritis, right hand: Secondary | ICD-10-CM | POA: Diagnosis not present

## 2015-07-11 DIAGNOSIS — J45909 Unspecified asthma, uncomplicated: Secondary | ICD-10-CM | POA: Insufficient documentation

## 2015-07-11 DIAGNOSIS — C679 Malignant neoplasm of bladder, unspecified: Secondary | ICD-10-CM | POA: Insufficient documentation

## 2015-07-11 DIAGNOSIS — Z79899 Other long term (current) drug therapy: Secondary | ICD-10-CM | POA: Insufficient documentation

## 2015-07-11 DIAGNOSIS — G4733 Obstructive sleep apnea (adult) (pediatric): Secondary | ICD-10-CM | POA: Diagnosis not present

## 2015-07-11 DIAGNOSIS — I10 Essential (primary) hypertension: Secondary | ICD-10-CM | POA: Diagnosis not present

## 2015-07-11 DIAGNOSIS — Z87891 Personal history of nicotine dependence: Secondary | ICD-10-CM | POA: Insufficient documentation

## 2015-07-11 DIAGNOSIS — N329 Bladder disorder, unspecified: Secondary | ICD-10-CM | POA: Diagnosis present

## 2015-07-11 DIAGNOSIS — K449 Diaphragmatic hernia without obstruction or gangrene: Secondary | ICD-10-CM | POA: Insufficient documentation

## 2015-07-11 DIAGNOSIS — F329 Major depressive disorder, single episode, unspecified: Secondary | ICD-10-CM | POA: Insufficient documentation

## 2015-07-11 DIAGNOSIS — E785 Hyperlipidemia, unspecified: Secondary | ICD-10-CM | POA: Diagnosis not present

## 2015-07-11 HISTORY — DX: Unspecified osteoarthritis, unspecified site: M19.90

## 2015-07-11 HISTORY — DX: Personal history of other diseases of the digestive system: Z87.19

## 2015-07-11 HISTORY — DX: Other specified postprocedural states: R11.2

## 2015-07-11 HISTORY — DX: Reserved for concepts with insufficient information to code with codable children: IMO0002

## 2015-07-11 HISTORY — DX: Diverticulosis of large intestine without perforation or abscess without bleeding: K57.30

## 2015-07-11 HISTORY — DX: Adverse effect of unspecified anesthetic, initial encounter: T41.45XA

## 2015-07-11 HISTORY — DX: Personal history of other diseases of the female genital tract: Z87.42

## 2015-07-11 HISTORY — DX: Personal history of diseases of the skin and subcutaneous tissue: Z87.2

## 2015-07-11 HISTORY — DX: Hyperlipidemia, unspecified: E78.5

## 2015-07-11 HISTORY — DX: Other specified postprocedural states: Z98.890

## 2015-07-11 HISTORY — PX: TRANSURETHRAL RESECTION OF BLADDER TUMOR: SHX2575

## 2015-07-11 HISTORY — DX: Other complications of anesthesia, initial encounter: T88.59XA

## 2015-07-11 HISTORY — DX: Chronic obstructive pulmonary disease, unspecified: J44.9

## 2015-07-11 LAB — POCT I-STAT, CHEM 8
BUN: 15 mg/dL (ref 6–20)
CALCIUM ION: 1.13 mmol/L (ref 1.13–1.30)
CHLORIDE: 101 mmol/L (ref 101–111)
Creatinine, Ser: 0.7 mg/dL (ref 0.44–1.00)
GLUCOSE: 121 mg/dL — AB (ref 65–99)
HCT: 37 % (ref 36.0–46.0)
HEMOGLOBIN: 12.6 g/dL (ref 12.0–15.0)
Potassium: 3.4 mmol/L — ABNORMAL LOW (ref 3.5–5.1)
SODIUM: 140 mmol/L (ref 135–145)
TCO2: 24 mmol/L (ref 0–100)

## 2015-07-11 SURGERY — TURBT (TRANSURETHRAL RESECTION OF BLADDER TUMOR)
Anesthesia: General | Site: Bladder

## 2015-07-11 MED ORDER — FENTANYL CITRATE (PF) 100 MCG/2ML IJ SOLN
INTRAMUSCULAR | Status: AC
  Filled 2015-07-11: qty 4

## 2015-07-11 MED ORDER — GLYCOPYRROLATE 0.2 MG/ML IJ SOLN
INTRAMUSCULAR | Status: AC
  Filled 2015-07-11: qty 1

## 2015-07-11 MED ORDER — MIDAZOLAM HCL 2 MG/2ML IJ SOLN
INTRAMUSCULAR | Status: AC
  Filled 2015-07-11: qty 4

## 2015-07-11 MED ORDER — CEFAZOLIN SODIUM-DEXTROSE 2-3 GM-% IV SOLR
INTRAVENOUS | Status: AC
  Filled 2015-07-11: qty 50

## 2015-07-11 MED ORDER — PHENAZOPYRIDINE HCL 100 MG PO TABS
ORAL_TABLET | ORAL | Status: AC
  Filled 2015-07-11: qty 2

## 2015-07-11 MED ORDER — PHENAZOPYRIDINE HCL 200 MG PO TABS: 200.0000 mg | ORAL_TABLET | Freq: Three times a day (TID) | ORAL | Status: DC | PRN

## 2015-07-11 MED ORDER — CEFAZOLIN SODIUM 1-5 GM-% IV SOLN
1.0000 g | INTRAVENOUS | Status: DC
  Filled 2015-07-11: qty 50

## 2015-07-11 MED ORDER — LIDOCAINE HCL (CARDIAC) 20 MG/ML IV SOLN
INTRAVENOUS | Status: DC | PRN
  Administered 2015-07-11: 60 mg via INTRAVENOUS

## 2015-07-11 MED ORDER — NITROFURANTOIN MONOHYD MACRO 100 MG PO CAPS: 100.0000 mg | ORAL_CAPSULE | Freq: Every day | ORAL | Status: DC

## 2015-07-11 MED ORDER — FENTANYL CITRATE (PF) 100 MCG/2ML IJ SOLN
INTRAMUSCULAR | Status: DC | PRN
  Administered 2015-07-11: 50 ug via INTRAVENOUS

## 2015-07-11 MED ORDER — FENTANYL CITRATE (PF) 100 MCG/2ML IJ SOLN
25.0000 ug | INTRAMUSCULAR | Status: DC | PRN
  Filled 2015-07-11: qty 1

## 2015-07-11 MED ORDER — ONDANSETRON HCL 4 MG/2ML IJ SOLN
INTRAMUSCULAR | Status: DC | PRN
  Administered 2015-07-11: 4 mg via INTRAVENOUS

## 2015-07-11 MED ORDER — LIDOCAINE HCL (CARDIAC) 20 MG/ML IV SOLN
INTRAVENOUS | Status: AC
Start: 2015-07-11 — End: 2015-07-11
  Filled 2015-07-11: qty 5

## 2015-07-11 MED ORDER — PHENAZOPYRIDINE HCL 200 MG PO TABS
200.0000 mg | ORAL_TABLET | Freq: Once | ORAL | Status: AC
  Administered 2015-07-11: 200 mg via ORAL
  Filled 2015-07-11: qty 1

## 2015-07-11 MED ORDER — DEXAMETHASONE SODIUM PHOSPHATE 10 MG/ML IJ SOLN
INTRAMUSCULAR | Status: AC
  Filled 2015-07-11: qty 1

## 2015-07-11 MED ORDER — CEFAZOLIN SODIUM-DEXTROSE 2-3 GM-% IV SOLR
2.0000 g | INTRAVENOUS | Status: AC
  Administered 2015-07-11: 2 g via INTRAVENOUS
  Filled 2015-07-11: qty 50

## 2015-07-11 MED ORDER — DEXAMETHASONE SODIUM PHOSPHATE 4 MG/ML IJ SOLN
INTRAMUSCULAR | Status: DC | PRN
  Administered 2015-07-11: 10 mg via INTRAVENOUS

## 2015-07-11 MED ORDER — SODIUM CHLORIDE 0.9 % IR SOLN
Status: DC | PRN
  Administered 2015-07-11: 6000 mL via INTRAVESICAL
  Administered 2015-07-11: 3000 mL via INTRAVESICAL

## 2015-07-11 MED ORDER — KETOROLAC TROMETHAMINE 30 MG/ML IJ SOLN
INTRAMUSCULAR | Status: AC
  Filled 2015-07-11: qty 1

## 2015-07-11 MED ORDER — LACTATED RINGERS IV SOLN
INTRAVENOUS | Status: DC
Start: 2015-07-11 — End: 2015-07-11
  Administered 2015-07-11 (×2): via INTRAVENOUS
  Filled 2015-07-11: qty 1000

## 2015-07-11 MED ORDER — KETOROLAC TROMETHAMINE 30 MG/ML IJ SOLN
INTRAMUSCULAR | Status: DC | PRN
  Administered 2015-07-11: 15 mg via INTRAVENOUS

## 2015-07-11 MED ORDER — ONDANSETRON HCL 4 MG/2ML IJ SOLN
INTRAMUSCULAR | Status: AC
  Filled 2015-07-11: qty 2

## 2015-07-11 MED ORDER — MIDAZOLAM HCL 5 MG/5ML IJ SOLN
INTRAMUSCULAR | Status: DC | PRN
  Administered 2015-07-11: 1 mg via INTRAVENOUS

## 2015-07-11 MED ORDER — LIDOCAINE HCL (CARDIAC) 20 MG/ML IV SOLN
INTRAVENOUS | Status: AC
  Filled 2015-07-11: qty 5

## 2015-07-11 MED ORDER — PROPOFOL 10 MG/ML IV BOLUS
INTRAVENOUS | Status: DC | PRN
  Administered 2015-07-11: 60 mg via INTRAVENOUS
  Administered 2015-07-11: 140 mg via INTRAVENOUS

## 2015-07-11 MED ORDER — LACTATED RINGERS IV SOLN
INTRAVENOUS | Status: DC
  Filled 2015-07-11: qty 1000

## 2015-07-11 MED ORDER — PROPOFOL 500 MG/50ML IV EMUL
INTRAVENOUS | Status: AC
  Filled 2015-07-11: qty 50

## 2015-07-11 SURGICAL SUPPLY — 33 items
BAG DRAIN URO-CYSTO SKYTR STRL (DRAIN) ×3 IMPLANT
BAG DRN ANRFLXCHMBR STRAP LEK (BAG)
BAG DRN UROCATH (DRAIN) ×1
BAG URINE DRAINAGE (UROLOGICAL SUPPLIES) ×2 IMPLANT
BAG URINE LEG 19OZ MD ST LTX (BAG) IMPLANT
CATH FOLEY 2WAY SLVR  5CC 16FR (CATHETERS) ×2
CATH FOLEY 2WAY SLVR  5CC 20FR (CATHETERS)
CATH FOLEY 2WAY SLVR  5CC 22FR (CATHETERS)
CATH FOLEY 2WAY SLVR 5CC 16FR (CATHETERS) IMPLANT
CATH FOLEY 2WAY SLVR 5CC 20FR (CATHETERS) IMPLANT
CATH FOLEY 2WAY SLVR 5CC 22FR (CATHETERS) IMPLANT
CLOTH BEACON ORANGE TIMEOUT ST (SAFETY) ×3 IMPLANT
ELECT LOOP 22F BIPOLAR SML (ELECTROSURGICAL) ×3
ELECT REM PT RETURN 9FT ADLT (ELECTROSURGICAL)
ELECTRODE LOOP 22F BIPOLAR SML (ELECTROSURGICAL) IMPLANT
ELECTRODE REM PT RTRN 9FT ADLT (ELECTROSURGICAL) ×1 IMPLANT
EVACUATOR MICROVAS BLADDER (UROLOGICAL SUPPLIES) IMPLANT
GLOVE BIO SURGEON STRL SZ 6.5 (GLOVE) ×1 IMPLANT
GLOVE BIO SURGEON STRL SZ7.5 (GLOVE) ×3 IMPLANT
GLOVE BIO SURGEONS STRL SZ 6.5 (GLOVE) ×1
GLOVE BIOGEL PI IND STRL 6.5 (GLOVE) IMPLANT
GLOVE BIOGEL PI INDICATOR 6.5 (GLOVE) ×4
GOWN STRL REUS W/ TWL XL LVL3 (GOWN DISPOSABLE) ×1 IMPLANT
GOWN STRL REUS W/TWL XL LVL3 (GOWN DISPOSABLE) ×6
HOLDER FOLEY CATH W/STRAP (MISCELLANEOUS) ×2 IMPLANT
IV NS IRRIG 3000ML ARTHROMATIC (IV SOLUTION) ×6 IMPLANT
KIT ROOM TURNOVER WOR (KITS) ×3 IMPLANT
MANIFOLD NEPTUNE II (INSTRUMENTS) ×2 IMPLANT
PACK CYSTO (CUSTOM PROCEDURE TRAY) ×5 IMPLANT
PLUG CATH AND CAP STER (CATHETERS) IMPLANT
SET ASPIRATION TUBING (TUBING) IMPLANT
TUBE CONNECTING 12'X1/4 (SUCTIONS) ×1
TUBE CONNECTING 12X1/4 (SUCTIONS) ×1 IMPLANT

## 2015-07-11 NOTE — Anesthesia Procedure Notes (Signed)
Procedure Name: LMA Insertion Date/Time: 07/11/2015 7:35 AM Performed by: Wanita Chamberlain Pre-anesthesia Checklist: Patient identified, Timeout performed, Emergency Drugs available, Suction available and Patient being monitored Patient Re-evaluated:Patient Re-evaluated prior to inductionOxygen Delivery Method: Circle system utilized Preoxygenation: Pre-oxygenation with 100% oxygen Intubation Type: IV induction Ventilation: Mask ventilation without difficulty LMA: LMA inserted LMA Size: 4.0 Number of attempts: 1 Airway Equipment and Method: Bite block Placement Confirmation: breath sounds checked- equal and bilateral and positive ETCO2 Tube secured with: Tape Dental Injury: Teeth and Oropharynx as per pre-operative assessment

## 2015-07-11 NOTE — Anesthesia Postprocedure Evaluation (Signed)
  Anesthesia Post-op Note  Patient: Jennifer Pineda  Procedure(s) Performed: Procedure(s) (LRB): TRANSURETHRAL RESECTION OF BLADDER TUMOR (TURBT) (N/A)  Patient Location: PACU  Anesthesia Type: General  Level of Consciousness: awake and alert   Airway and Oxygen Therapy: Patient Spontanous Breathing  Post-op Pain: mild  Post-op Assessment: Post-op Vital signs reviewed, Patient's Cardiovascular Status Stable, Respiratory Function Stable, Patent Airway and No signs of Nausea or vomiting  Last Vitals:  Filed Vitals:   07/11/15 1015  BP: 142/53  Pulse: 71  Temp: 36.6 C  Resp: 16    Post-op Vital Signs: stable   Complications: No apparent anesthesia complications

## 2015-07-11 NOTE — Op Note (Signed)
Preoperative diagnosis: Bladder neoplasm Postoperative diagnosis: Bladder neoplasm  Procedure: TURBT 2-5 cm  Surgeon: Junious Silk  Anesthesia: Gen.  Indication for procedure: 64 year old female with gross hematuria and a papillary mass at the left bladder.  Findings: On exam under anesthesia/bimanual exam the bladder was palpably normal with no palpable mass.  On cystoscopy there was a dominant papillary tumor a few centimeters above the right ureteral orifice with some multifocal scattered papillary tumors inferior to it and some erythema medial to it. Posterior to it there was another group of tumor that spread up the back of the bladder wall which look like a contiguous process. The remainder of the bladder was unremarkable. There was good efflux from the left ureteral orifice after all resection.  Discussion of procedure: After consent was obtained patient brought to the operating room. After adequate anesthesia she was placed in lithotomy position and prepped and draped in the usual sterile fashion. A timeout was performed to confirm the patient and procedure. The cystoscope was passed per urethra and the bladder carefully inspected. The erythema medial to the tumor was biopsied with the cold cup forceps and then the resectoscope and loop was placed to fulgurate this. The other medial areas of erythema or lightly fulgurated to destroy any tumor. The tumor inferior to the predominant tumor was obvious superficial papillary fronds and these were ablated and fulgurated. The left ureteral orifice was not involved. I then resected the main tumor. There was one thin area which was into the muscle but no perforation was noted and fat. This area was a few millimeters in size. The posterior to the predominant tumor was another group of tumor going up the back wall of the bladder and this was resected and fulgurated. Hemostasis was excellent. There was clear efflux bilaterally. The scope was removed and a 16  [place] Foley placed. Patient was awakened taken to recovery in stable condition. I decided against mitomycin given the multifocal nature and one thin area. There was equal return of irrigation and again the thin area was very small.   Complications: None  Blood loss: Minimal  Specimens to pathology: #1 left bladder biopsy, #2 bladder tumor  Drains: 16 [place] Foley

## 2015-07-11 NOTE — Telephone Encounter (Signed)
Patient arrived late to appt yesterday and was unable to be seen.  Attempted to reschedule.  LM with daughter for patient to call office.

## 2015-07-11 NOTE — H&P (Signed)
H&P  Chief Complaint: bladder mass   History of Present Illness: 64 yo female with gross hematuria. CT and cystoscopy revealed a papillary, left bladder mass. She presents for TURBT and [place]. She has been well and denies further hematuria. No dysuria or fever.   Past Medical History  Diagnosis Date  . GERD (gastroesophageal reflux disease)   . Essential hypertension   . Anxiety and depression   . Allergic rhinitis   . CAD (coronary artery disease)     per CT 06-02-2015  . Asthma, persistent   . OSA on CPAP     severe per study 05-09-2010  . History of hiatal hernia   . Hyperlipidemia   . Diverticulosis of colon   . Gross hematuria   . Bladder neoplasm   . COPD, mild ([place])     pulmologist--  dr Vilinda Boehringer ([facility] in [place])  . History of endometriosis   . History of cellulitis     Nov 2015-- nasal cellulitis--  resolved  . Complication of anesthesia     slow to wake  . PONV (postoperative nausea and vomiting)   . Arthritis     hands   Past Surgical History  Procedure Laterality Date  . Esophagogastroduodenoscopy  last one 08/ 2001  . Septoplasty  03/  2002  . Colonoscopy  last one 02/ 2014  . Abdominal hysterectomy  1983    and Appendectomy  . Trigger finger release Right 2014  . Lumbar laminectomy/decompression microdiscectomy  10/ 2013  . Cardiovascular stress test  10-06-1998    no evidence ischemia or scar/  normal LV function and wall motion, ef 65%    Home Medications:  Prescriptions prior to admission  Medication Sig Dispense Refill Last Dose  . albuterol (PROVENTIL HFA;VENTOLIN HFA) 108 (90 BASE) MCG/ACT inhaler Inhale 2 puffs into the lungs every 6 (six) hours as needed. For shortness of breath. 54 Inhaler 3 Past Month at Unknown time  . amLODipine (NORVASC) 10 MG tablet Take 1 tablet (10 mg total) by mouth daily. (Patient taking differently: Take 10 mg by mouth every evening. ) 90 tablet 3 07/10/2015 at Unknown time  . atorvastatin  (LIPITOR) 10 MG tablet TAKE ONE TABLET BY MOUTH AT BEDTIME 90 tablet 3 07/10/2015 at Unknown time  . FLUoxetine (PROZAC) 10 MG capsule TAKE ONE CAPSULE BY MOUTH ONCE DAILY (Patient taking differently: Take 10 mg by mouth every morning. TAKE ONE CAPSULE BY MOUTH ONCE DAILY) 90 capsule 3 07/11/2015 at 0430  . fluticasone (FLONASE) 50 MCG/ACT nasal spray Place 2 sprays into both nostrils daily. (Patient taking differently: Place 2 sprays into both nostrils every morning. ) 48 g 3 Past Week at Unknown time  . guaifenesin (HUMIBID E) 400 MG TABS Take 400 mg by mouth every morning.    Taking  . loratadine (CLARITIN) 10 MG tablet TAKE ONE TABLET BY MOUTH ONCE DAILY (Patient taking differently: TAKE ONE TABLET BY MOUTH ONCE DAILY--  takes in am) 30 tablet 11 Past Week at Unknown time  . MENEST 0.3 MG tablet TAKE ONE TABLET BY MOUTH ONCE DAILY (Patient taking differently: TAKE ONE TABLET BY MOUTH ONCE DAILY--  takes in am) 90 tablet 1 07/11/2015 at 0430  . omeprazole (PRILOSEC) 40 MG capsule TAKE ONE CAPSULE BY MOUTH IN THE MORNING. TAKE 45 MINUTES PRIOR TO BREAKFAST 90 capsule 0 07/11/2015 at 0430  . Tiotropium Bromide Monohydrate (SPIRIVA RESPIMAT) 2.5 MCG/ACT AERS Inhale 2 puffs every morning. Rinse/gargle after each use. 4 g 5  07/11/2015 at 0430  . triamterene-hydrochlorothiazide (MAXZIDE-25) 37.5-25 MG per tablet TAKE ONE TABLET BY MOUTH ONCE DAILY (Patient taking differently: Take 1 tablet by mouth every morning. TAKE ONE TABLET BY MOUTH ONCE DAILY) 90 tablet 3 07/10/2015 at Unknown time  . Fluticasone-Salmeterol (ADVAIR) 500-50 MCG/DOSE AEPB Inhale 1 puff into the lungs 2 (two) times daily. (Patient taking differently: Inhale 1 puff into the lungs 2 (two) times daily as needed. ) 60 each 3 More than a month at Unknown time   Allergies:  Allergies  Allergen Reactions  . Levofloxacin Shortness Of Breath and Swelling  . Ampicillin Diarrhea and Other (See Comments)     stomach upset  . Doxycycline Hyclate  Diarrhea and Other (See Comments)     stomach upset  . Metoprolol Succinate Diarrhea and Other (See Comments)    stomach upset  . Tetracycline Hcl Diarrhea and Other (See Comments)     stomach upset  . Adhesive [Tape] Rash    Family History  Problem Relation Age of Onset  . Cancer Mother     kidney and lymphoma CA, neprectomy tumor of lung, non smoker  . Kidney disease Mother     T-cell cancer in kidney  . Aneurysm Father     ? in throat  . Diabetes Father   . Heart disease Father     MI  . Diabetes Sister   . Hyperlipidemia Sister   . Heart disease Brother 41    MI  . Alcohol abuse Brother     smokes again  . Hypertension Brother   . Heart disease Maternal Aunt     MI  . Cancer Brother 32    T-cell renal cancer/ removed  . Hypertension Brother   . Colon cancer Neg Hx   . Esophageal cancer Neg Hx   . Rectal cancer Neg Hx   . Stomach cancer Neg Hx    Social History:  reports that she quit smoking about 28 years ago. Her smoking use included Cigarettes. She has a 38 pack-year smoking history. She has never used smokeless tobacco. She reports that she drinks about 1.0 oz of alcohol per week. She reports that she does not use illicit drugs.  ROS: A complete review of systems was performed.  All systems are negative except for pertinent findings as noted. ROS   Physical Exam:  Vital signs in last 24 hours: Temp:  [98.6 F (37 C)] 98.6 F (37 C) (11/15 0557) Pulse Rate:  [70] 70 (11/15 0557) Resp:  [16] 16 (11/15 0557) BP: (177)/(61) 177/61 mmHg (11/15 0557) SpO2:  [96 %] 96 % (11/15 0557) Weight:  [87.544 kg (193 lb)] 87.544 kg (193 lb) (11/15 0557) General:  Alert and oriented, No acute distress HEENT: Normocephalic, atraumatic Cardiovascular: Regular rate and rhythm Lungs: Regular rate and effort Abdomen: Soft, nontender, nondistended, no abdominal masses Extremities: No edema Neurologic: Grossly intact  Laboratory Data:  Results for orders placed or  performed during the hospital encounter of 07/11/15 (from the past 24 hour(s))  I-STAT, chem 8     Status: Abnormal   Collection Time: 07/11/15  6:39 AM  Result Value Ref Range   Sodium 140 135 - 145 mmol/L   Potassium 3.4 (L) 3.5 - 5.1 mmol/L   Chloride 101 101 - 111 mmol/L   BUN 15 6 - 20 mg/dL   Creatinine, Ser 0.70 0.44 - 1.00 mg/dL   Glucose, Bld 121 (H) 65 - 99 mg/dL   Calcium, Ion 1.13 1.13 - 1.30  mmol/L   TCO2 24 0 - 100 mmol/L   Hemoglobin 12.6 12.0 - 15.0 g/dL   HCT 37.0 36.0 - 46.0 %   No results found for this or any previous visit (from the past 240 hour(s)). Creatinine:  Recent Labs  07/11/15 0639  CREATININE 0.70    Impression/Assessment:  Bladder mass  Plan:  I discussed with the patient and daughter the nature, potential benefits, risks and alternatives to TURBT, mitomycin-c instillation, possible left ureteral stent, including side effects of the proposed treatment, the likelihood of the patient achieving the goals of the procedure, and any potential problems that might occur during the procedure or recuperation. All questions answered. Patient elects to proceed. Of note, her reaction to ampicillin is GI side effects. No rash, swelling, sob. Etc. Plan ancef.    Jasemine Nawaz 07/11/2015, 7:29 AM

## 2015-07-11 NOTE — Discharge Instructions (Signed)
Foley Catheter Care, Adult A Foley catheter is a soft, flexible tube. This tube is placed into your bladder to drain pee (urine). If you go home with this catheter in place, follow the instructions below.  REMOVAL OF THE FOLEY -- remove the foley as instructed Friday morning. Jul 14, 2015. Call the office if there are any issues.   TAKING CARE OF THE CATHETER  Wash your hands with soap and water.  Put soap and water on a clean washcloth.  Clean the skin where the tube goes into your body.  Clean away from the tube site.  Never wipe toward the tube.  Clean the area using a circular motion.  Remove all the soap. Pat the area dry with a clean towel. For males, reposition the skin that covers the end of the penis (foreskin).  Attach the tube to your leg with tape or a leg strap. Do not stretch the tube tight. If you are using tape, remove any stickiness left behind by past tape you used.  Keep the drainage bag below your hips. Keep it off the floor.  Check your tube during the day. Make sure it is working and draining. Make sure the tube does not curl, twist, or bend.  Do not pull on the tube or try to take it out. TAKING CARE OF THE DRAINAGE BAGS You will have a large overnight drainage bag and a small leg bag. You may wear the overnight bag any time. Never wear the small bag at night. Follow the directions below. Emptying the Drainage Bag Empty your drainage bag when it is  - full or at least 2-3 times a day.  Wash your hands with soap and water.  Keep the drainage bag below your hips.  Hold the dirty bag over the toilet or clean container.  Open the pour spout at the bottom of the bag. Empty the pee into the toilet or container. Do not let the pour spout touch anything.  Clean the pour spout with a gauze pad or cotton ball that has rubbing alcohol on it.  Close the pour spout.  Attach the bag to your leg with tape or a leg strap.  Wash your hands well. Changing the  Drainage Bag Change your bag once a month or sooner if it starts to smell or look dirty.   Wash your hands with soap and water.  Pinch the rubber tube so that pee does not spill out.  Disconnect the catheter tube from the drainage tube at the connection valve. Do not let the tubes touch anything.  Clean the end of the catheter tube with an alcohol wipe. Clean the end of a the drainage tube with a different alcohol wipe.  Connect the catheter tube to the drainage tube of the clean drainage bag.  Attach the new bag to the leg with tape or a leg strap. Avoid attaching the new bag too tightly.  Wash your hands well. Cleaning the Drainage Bag  Wash your hands with soap and water.  Wash the bag in warm, soapy water.  Rinse the bag with warm water.  Fill the bag with a mixture of white vinegar and water (1 cup vinegar to 1 quart warm water [.2 liter vinegar to 1 liter warm water]). Close the bag and soak it for 30 minutes in the solution.  Rinse the bag with warm water.  Hang the bag to dry with the pour spout open and hanging downward.  Store the clean bag (  once it is dry) in a clean plastic bag.  Wash your hands well. PREVENT INFECTION  Wash your hands before and after touching your tube.  Take showers every day. Wash the skin where the tube enters your body. Do not take baths. Replace wet leg straps with dry ones, if this applies.  Do not use powders, sprays, or lotions on the genital area. Only use creams, lotions, or ointments as told by your doctor.  For females, wipe from front to back after going to the bathroom.  Drink enough fluids to keep your pee clear or pale yellow unless you are told not to have too much fluid (fluid restriction).  Do not let the drainage bag or tubing touch or lie on the floor.  Wear cotton underwear to keep the area dry. GET HELP IF:  Your pee is cloudy or smells unusually bad.  Your tube becomes clogged.  You are not draining pee into  the bag or your bladder feels full.  Your tube starts to leak. GET HELP RIGHT AWAY IF:  You have pain, puffiness (swelling), redness, or yellowish-white fluid (pus) where the tube enters the body.  You have pain in the belly (abdomen), legs, lower back, or bladder.  You have a fever.  You see blood fill the tube, or your pee is pink or red.  You feel sick to your stomach (nauseous), throw up (vomit), or have chills.  Your tube gets pulled out. MAKE SURE YOU:   Understand these instructions.  Will watch your condition.  Will get help right away if you are not doing well or get worse.   This information is not intended to replace advice given to you by your health care provider. Make sure you discuss any questions you have with your health care provider.   Document Released: 12/07/2012 Document Revised: 09/02/2014 Document Reviewed: 12/07/2012 Elsevier Interactive Patient Education 2016 [place] Instructions  Activity: Get plenty of rest for the remainder of the day. A responsible adult should stay with you for 24 hours following the procedure.  For the next 24 hours, DO NOT: -Drive a car -Paediatric nurse -Drink alcoholic beverages -Take any medication unless instructed by your physician -Make any legal decisions or sign important papers.  Meals: Start with liquid foods such as gelatin or soup. Progress to regular foods as tolerated. Avoid greasy, spicy, heavy foods. If nausea and/or vomiting occur, drink only clear liquids until the nausea and/or vomiting subsides. Call your physician if vomiting continues.  Special Instructions/Symptoms: Your throat may feel dry or sore from the anesthesia or the breathing tube placed in your throat during surgery. If this causes discomfort, gargle with warm salt water. The discomfort should disappear within 24 hours.  If you had a scopolamine patch placed behind your ear for the management of post-  operative nausea and/or vomiting:  1. The medication in the patch is effective for 72 hours, after which it should be removed.  Wrap patch in a tissue and discard in the trash. Wash hands thoroughly with soap and water. 2. You may remove the patch earlier than 72 hours if you experience unpleasant side effects which may include dry mouth, dizziness or visual disturbances. 3. Avoid touching the patch. Wash your hands with soap and water after contact with the patch.   Transurethral Resection, Bladder Tumor A cancerous growth (tumor) can develop on the inside wall of the bladder. The bladder is the organ that holds urine. One way  to remove the tumor is a procedure called a transurethral resection. The tumor is removed (resected) through the tube that carries urine from the bladder out of the body (urethra). No cuts (incisions) are made in the skin. Instead, the procedure is done through a thin telescope, called a resectoscope. Attached to it is a light and usually a tiny camera. The resectoscope is put into the urethra. In men, the urethra opens at the end of the penis. In women, it opens just above the vagina.  A transurethral resection is usually used to remove tumors that have not gotten too big or too deep. These are called Stage 0, Stage 1 or Stage 2 bladder cancers. LET YOUR CAREGIVER KNOW ABOUT:  On the day of the procedure, your caregivers will need to know the last time you had anything to eat or drink. This includes water, gum, and candy. In advance, make sure they know about:   Any allergies.  All medications you are taking, including:  Herbs, eyedrops, over-the-counter medications and creams.  Blood thinners (anticoagulants), aspirin or other drugs that could affect blood clotting.  Use of steroids (by mouth or as creams).  Previous problems with anesthetics, including local anesthetics.  Possibility of pregnancy, if this applies.  Any history of blood clots.  Any history of  bleeding or other blood problems.  Previous surgery.  Smoking history.  Any recent symptoms of colds or infections.  Other health problems. RISKS AND COMPLICATIONS This is usually a safe procedure. Every procedure has risks, though. For a transurethral resection, they include:  Infection. Antibiotic medication would need to be taken.  Bleeding.  Light bleeding may last for several days after the procedure.  If bleeding continues or is heavy, the bladder may need rinsing. Or, a new catheter might be put in for awhile.  Sometimes bed rest is needed.  Urination problems.  Pain and burning can occur when urinating. This usually goes away in a few days.  Scarring from the procedure can block the flow of urine.  Bladder damage.  It can be punctured or torn during removal of the tumor. If this happens, a catheter might be needed for longer. Antibiotics would be taken while the bladder heals.  Urine can leak through the hole or tear into the abdomen. If this happens, surgery may be needed to repair the bladder. BEFORE THE PROCEDURE   A medical evaluation will be done. This may include:  A physical examination.  Urine test. This is to make sure you do not have a urinary tract infection.  Blood tests.  A test that checks the heart's rhythm (electrocardiogram).  Talking with an anesthesiologist. This is the person who will be in charge of the medication (anesthesia) to keep you from feeling pain during the transurethral resection. You might be asleep during the procedure (general anesthesia) or numb from the waist down, but awake during the procedure (spinal anesthesia). Ask your surgeon what to expect.  The person who is having a transurethral resection needs to give what is called informed consent. This requires signing a legal paper that gives permission for the procedure. To give informed consent:  You must understand how the procedure is done and why.  You must be told  all the risks and benefits of the procedure.  You must sign the consent. Sometimes a legal guardian can do this.  Signing should be witnessed by a healthcare professional.  The day before the surgery, eat only a light dinner. Then, do not  eat or drink anything for at least 8 hours before the surgery. Ask your caregiver if it is OK to take any needed medicines with a sip of water.  Arrive at least an hour before the surgery or whenever your surgeon recommends. This will give you time to check in and fill out any needed paperwork. PROCEDURE  The preparation:  You will change into a hospital gown.  A needle will be inserted in your arm. This is an intravenous access tube (IV). Medication will be able to flow directly into your body through this needle.  Small monitors will be put on your body. They are used to check your heart, blood pressure, and oxygen level.  You might be given medication that will help you relax (sedative).  You will be given a general anesthetic or spinal anesthesia.  The procedure:  Once you are asleep or numb from the waist down, your legs will be placed in stirrups.  The resectoscope will be passed through the urethra into the bladder.  Fluid will be passed through the resectoscope. This will fill the bladder with water.  The surgeon will examine the bladder through the scope. If the scope has a camera, it can take pictures from inside the bladder. They can be projected onto a TV screen.  The surgeon will use various tools to remove the tumor in small pieces. Sometimes a laser (a beam of light energy) is used. Other tools may use electric current.  A tube (catheter) will often be placed so that urine can drain into a bag outside the body. This process helps stop bleeding. This tube keeps blood clots from blocking the urethra.  The procedure usually takes 30 to 45 minutes. AFTER THE PROCEDURE   You will stay in a recovery area until the anesthesia has worn  off. Your blood pressure and pulse will be checked every so often. Then you will be taken to a hospital room.  You may continue to get fluids through the IV for awhile.  Some pain is normal. The catheter might be uncomfortable. Pain is usually not severe. If it is, ask for pain medicine.  Your urine may look bloody after a transurethral resection. This is normal.  If bleeding is heavy, a hospital caregiver may rinse out the bladder (irrigation) through the catheter.  Once the urine is clear, the catheter will be taken out.  You will need to stay in the hospital until you can urinate on your own.  Most people stay in the hospital for up to 4 days. PROGNOSIS   Transurethral resection is considered the best way to treat bladder tumors that are not too far along. For most people, the treatment is successful. Sometimes, though, more treatment is needed.  Bladder cancers can come back even after a successful procedure. Because of this, be sure to have a checkup with your caregiver every 3 to 6 months. If everything is OK for 3 years, you can reduce the checkups to once a year.   This information is not intended to replace advice given to you by your health care provider. Make sure you discuss any questions you have with your health care provider.   Document Released: 06/08/2009 Document Revised: 11/04/2011 Document Reviewed: 08/14/2009 Elsevier Interactive Patient Education [employer].

## 2015-07-11 NOTE — Transfer of Care (Signed)
Immediate Anesthesia Transfer of Care Note  Patient: Jennifer Pineda  Procedure(s) Performed: Procedure(s): TRANSURETHRAL RESECTION OF BLADDER TUMOR (TURBT) (N/A)  Patient Location: PACU  Anesthesia Type:General  Level of Consciousness: awake, alert , oriented and patient cooperative  Airway & Oxygen Therapy: Patient Spontanous Breathing and Patient connected to nasal cannula oxygen  Post-op Assessment: Report given to RN and Post -op Vital signs reviewed and stable  Post vital signs: Reviewed and stable  Last Vitals:  Filed Vitals:   07/11/15 0557  BP: 177/61  Pulse: 70  Temp: 37 C  Resp: 16    Complications: No apparent anesthesia complications

## 2015-07-12 ENCOUNTER — Encounter (HOSPITAL_BASED_OUTPATIENT_CLINIC_OR_DEPARTMENT_OTHER): Payer: Self-pay | Admitting: Urology

## 2015-07-14 ENCOUNTER — Other Ambulatory Visit: Payer: Self-pay | Admitting: Family Medicine

## 2015-07-28 ENCOUNTER — Other Ambulatory Visit: Payer: Self-pay | Admitting: Urology

## 2015-07-29 ENCOUNTER — Encounter: Payer: Self-pay | Admitting: Family Medicine

## 2015-07-29 DIAGNOSIS — C689 Malignant neoplasm of urinary organ, unspecified: Secondary | ICD-10-CM

## 2015-07-29 DIAGNOSIS — Z8551 Personal history of malignant neoplasm of bladder: Secondary | ICD-10-CM | POA: Insufficient documentation

## 2015-08-01 ENCOUNTER — Other Ambulatory Visit: Payer: Self-pay | Admitting: Urology

## 2015-08-02 ENCOUNTER — Telehealth: Payer: Self-pay | Admitting: Internal Medicine

## 2015-08-02 ENCOUNTER — Other Ambulatory Visit: Payer: Self-pay | Admitting: Family Medicine

## 2015-08-02 NOTE — Telephone Encounter (Signed)
I don't have Spiriva samples. We haven't had any in about 6 months.

## 2015-08-02 NOTE — Telephone Encounter (Signed)
Called made pt aware no samples at this time. Nothing further needed

## 2015-08-02 NOTE — Telephone Encounter (Signed)
Pt is requesting sample of spiriva. We do not have any in [place] office, Misty please advise if ya'll have samples in the [place] office? thanks

## 2015-08-11 ENCOUNTER — Encounter (HOSPITAL_BASED_OUTPATIENT_CLINIC_OR_DEPARTMENT_OTHER): Payer: Self-pay | Admitting: *Deleted

## 2015-08-11 ENCOUNTER — Other Ambulatory Visit: Payer: Self-pay | Admitting: Family Medicine

## 2015-08-11 NOTE — Progress Notes (Signed)
NPO AFTER MN.  ARRIVE AT 0915.  NEEDS ISTAT.   CURRENT CXR AND EKG IN CHART AND EPIC.  WILL TAKE DO SPIRIVA INHALER AND TAKE AM MEDS W/ EXCEPTION NO MAXIDE DOS W/ SIPS OF WATER.

## 2015-08-14 NOTE — H&P (Signed)
History of Present Illness F/u - PCP Dr. Danise Mina.      1- urothelial carcinoma-November 2016 high-grade T1 (focal) left bladder no muscle and specimen. Patient presented with gross hematuria. Former smoker.   Last upper tract screening: Oct 2016 - CT a/p with IV contrast - a 1.5 cm left posterior mass in bladder. Negative upper tracts - complete delays.  Last cystoscopy: Nov 2016 at diagnosis      Nov 2016 int hx      Past Medical History Problems  1. History of CPAP (continuous positive airway pressure) dependence (Z99.89) 2. History of arthritis (Z87.39) 3. History of asthma (Z87.09) 4. History of depression (Z86.59) 5. History of esophageal reflux (Z87.19) 6. History of hypercholesterolemia (Z86.39) 7. History of hypertension (Z86.79) 8. History of Obstructive sleep apnea, adult (G47.33)  Surgical History Problems  1. History of Hand Surgery 2. History of Hysterectomy 3. History of Nose Surgery  Current Meds 1. Advair Diskus 250-50 MCG/DOSE Inhalation Aerosol Powder Breath Activated;  Therapy: (Recorded:11Oct2016) to Recorded 2. Claritin 10 MG Oral Tablet;  Therapy: (Recorded:11Oct2016) to Recorded 3. Flonase 50 MCG/ACT SUSP;  Therapy: (Recorded:11Oct2016) to Recorded 4. FLUoxetine HCl - 10 MG Oral Capsule;  Therapy: [id number] to Recorded 5. Gentamicin Sulfate 0.1 % External Ointment;  Therapy: 23Sep2016 to Recorded 6. Humibid E TABS;  Therapy: (Recorded:11Oct2016) to Recorded 7. Lipitor 10 MG Oral Tablet;  Therapy: (Recorded:11Oct2016) to Recorded 8. Menest 0.3 MG Oral Tablet;  Therapy: [id number] to Recorded 9. Mupirocin 2 % External Ointment;  Therapy: 27Jun2016 to Recorded 10. Naproxen 500 MG Oral Tablet;   Therapy: [id number] to Recorded 11. Norvasc 10 MG Oral Tablet;   Therapy: (Recorded:11Oct2016) to Recorded 12. Phenazopyridine HCl - 200 MG Oral Tablet;   Therapy: [id number] to Recorded 13. PriLOSEC 40 MG Oral Capsule Delayed Release;  Therapy: (Recorded:11Oct2016) to Recorded 14. PROzac 10 MG TABS;   Therapy: (Recorded:11Oct2016) to Recorded 15. Spiriva Respimat 2.5 MCG/ACT Inhalation Aerosol Solution;   Therapy: [id number] to Recorded 16. Triamterene-HCTZ 37.5-25 MG Oral Tablet;   Therapy: [id number] to Recorded  Allergies Medication  1. Ampicillin CAPS 2. Doxycycline Hyclate CAPS 3. levofloxacin 4. Metoprolol Tartrate TABS 5. Tetracyclines  Family History Problems  1. Family history of Aneurysm : Father 2. Family history of arthritis (Z82.61) : Father 3. Family history of diabetes mellitus (Z83.3) : Father, Sister 4. Family history of kidney cancer (Z80.51) : Mother, Brother 5. Family history of kidney stones (Z84.1) : Sister 79. Family history of lymphoma (Z80.2) : Mother 7. Family history of myocardial infarction (Z82.49) : Father  Social History Problems  1. Alcohol use (Z78.9) 2. Caffeine use (F15.90)   3 a day 3. Divorced 4. History of Former smoker [id number] 5. 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 Malignant neoplasm of lateral wall of urinary bladder (C67.2)  Plan Malignant neoplasm of lateral wall of urinary bladder  1. Follow-up Schedule Surgery Office  Follow-up  Status: Hold For - Appointment   Requested for: [phone]  Discussion/Summary Urothelial carcinoma - we discussed the stage, grade, prognosis. Discussed nature, risks and benefits of surveillance versus BCG which will consider in the long run. We also discussed nature, r/b/a to repeat bladder biopsy / TUR to ensure no residual carcinoma and no upstage to T2. She elects to proceed.  I was surprised clinically there was no muscle in the specimen as a few of the bites were deep visible to the muscle.       Signatures Electronically  signed by : Festus Aloe, M.D.; Jul 24 2015  9:21AM EST

## 2015-08-15 ENCOUNTER — Encounter (HOSPITAL_BASED_OUTPATIENT_CLINIC_OR_DEPARTMENT_OTHER)
Admission: RE | Disposition: A | Discharge status: Home / Self Care | Payer: Self-pay | Source: Ambulatory Visit | Attending: Urology

## 2015-08-15 ENCOUNTER — Encounter (HOSPITAL_BASED_OUTPATIENT_CLINIC_OR_DEPARTMENT_OTHER): Payer: Self-pay | Admitting: *Deleted

## 2015-08-15 ENCOUNTER — Ambulatory Visit (HOSPITAL_BASED_OUTPATIENT_CLINIC_OR_DEPARTMENT_OTHER): Payer: Commercial Managed Care - HMO | Admitting: Anesthesiology

## 2015-08-15 ENCOUNTER — Ambulatory Visit (HOSPITAL_BASED_OUTPATIENT_CLINIC_OR_DEPARTMENT_OTHER)
Admission: RE | Admit: 2015-08-15 | Discharge: 2015-08-15 | Disposition: A | Discharge status: Home / Self Care | Payer: Commercial Managed Care - HMO | Source: Ambulatory Visit | Attending: Urology | Admitting: Urology

## 2015-08-15 DIAGNOSIS — M1990 Unspecified osteoarthritis, unspecified site: Secondary | ICD-10-CM | POA: Diagnosis not present

## 2015-08-15 DIAGNOSIS — E7800 Pure hypercholesterolemia, unspecified: Secondary | ICD-10-CM | POA: Insufficient documentation

## 2015-08-15 DIAGNOSIS — J449 Chronic obstructive pulmonary disease, unspecified: Secondary | ICD-10-CM | POA: Insufficient documentation

## 2015-08-15 DIAGNOSIS — F329 Major depressive disorder, single episode, unspecified: Secondary | ICD-10-CM | POA: Insufficient documentation

## 2015-08-15 DIAGNOSIS — Z79899 Other long term (current) drug therapy: Secondary | ICD-10-CM | POA: Insufficient documentation

## 2015-08-15 DIAGNOSIS — Z7951 Long term (current) use of inhaled steroids: Secondary | ICD-10-CM | POA: Insufficient documentation

## 2015-08-15 DIAGNOSIS — J45909 Unspecified asthma, uncomplicated: Secondary | ICD-10-CM | POA: Diagnosis not present

## 2015-08-15 DIAGNOSIS — G4733 Obstructive sleep apnea (adult) (pediatric): Secondary | ICD-10-CM | POA: Insufficient documentation

## 2015-08-15 DIAGNOSIS — I2510 Atherosclerotic heart disease of native coronary artery without angina pectoris: Secondary | ICD-10-CM | POA: Diagnosis not present

## 2015-08-15 DIAGNOSIS — K449 Diaphragmatic hernia without obstruction or gangrene: Secondary | ICD-10-CM | POA: Insufficient documentation

## 2015-08-15 DIAGNOSIS — Z6833 Body mass index (BMI) 33.0-33.9, adult: Secondary | ICD-10-CM | POA: Insufficient documentation

## 2015-08-15 DIAGNOSIS — Z90710 Acquired absence of both cervix and uterus: Secondary | ICD-10-CM | POA: Insufficient documentation

## 2015-08-15 DIAGNOSIS — E669 Obesity, unspecified: Secondary | ICD-10-CM | POA: Diagnosis not present

## 2015-08-15 DIAGNOSIS — C672 Malignant neoplasm of lateral wall of bladder: Secondary | ICD-10-CM | POA: Insufficient documentation

## 2015-08-15 DIAGNOSIS — Z791 Long term (current) use of non-steroidal anti-inflammatories (NSAID): Secondary | ICD-10-CM | POA: Insufficient documentation

## 2015-08-15 DIAGNOSIS — K219 Gastro-esophageal reflux disease without esophagitis: Secondary | ICD-10-CM | POA: Diagnosis not present

## 2015-08-15 DIAGNOSIS — Z87891 Personal history of nicotine dependence: Secondary | ICD-10-CM | POA: Diagnosis not present

## 2015-08-15 DIAGNOSIS — I10 Essential (primary) hypertension: Secondary | ICD-10-CM | POA: Insufficient documentation

## 2015-08-15 HISTORY — PX: CYSTOSCOPY WITH BIOPSY: SHX5122

## 2015-08-15 HISTORY — PX: TRANSURETHRAL RESECTION OF BLADDER TUMOR: SHX2575

## 2015-08-15 LAB — POCT I-STAT 4, (NA,K, GLUC, HGB,HCT)
Glucose, Bld: 111 mg/dL — ABNORMAL HIGH (ref 65–99)
HEMATOCRIT: 35 % — AB (ref 36.0–46.0)
HEMOGLOBIN: 11.9 g/dL — AB (ref 12.0–15.0)
Potassium: 3.6 mmol/L (ref 3.5–5.1)
Sodium: 139 mmol/L (ref 135–145)

## 2015-08-15 SURGERY — CYSTOSCOPY, WITH BIOPSY
Anesthesia: General

## 2015-08-15 MED ORDER — CEPHALEXIN 500 MG PO CAPS: 500.0000 mg | ORAL_CAPSULE | Freq: Every day | ORAL | Status: DC

## 2015-08-15 MED ORDER — CEFAZOLIN SODIUM-DEXTROSE 2-3 GM-% IV SOLR
2.0000 g | Freq: Once | INTRAVENOUS | Status: AC
  Administered 2015-08-15: 2 g via INTRAVENOUS
  Filled 2015-08-15: qty 50

## 2015-08-15 MED ORDER — MIDAZOLAM HCL 5 MG/5ML IJ SOLN
INTRAMUSCULAR | Status: DC | PRN
  Administered 2015-08-15: 1 mg via INTRAVENOUS

## 2015-08-15 MED ORDER — ONDANSETRON HCL 4 MG/2ML IJ SOLN
INTRAMUSCULAR | Status: AC
  Filled 2015-08-15: qty 2

## 2015-08-15 MED ORDER — FENTANYL CITRATE (PF) 100 MCG/2ML IJ SOLN
INTRAMUSCULAR | Status: AC
Start: 2015-08-15 — End: 2015-08-15
  Filled 2015-08-15: qty 4

## 2015-08-15 MED ORDER — PROPOFOL 10 MG/ML IV BOLUS
INTRAVENOUS | Status: DC | PRN
  Administered 2015-08-15: 140 mg via INTRAVENOUS

## 2015-08-15 MED ORDER — PROMETHAZINE HCL 25 MG/ML IJ SOLN
6.2500 mg | INTRAMUSCULAR | Status: DC | PRN
  Filled 2015-08-15: qty 1

## 2015-08-15 MED ORDER — ONDANSETRON HCL 4 MG/2ML IJ SOLN
INTRAMUSCULAR | Status: DC | PRN
  Administered 2015-08-15: 4 mg via INTRAVENOUS

## 2015-08-15 MED ORDER — LIDOCAINE HCL (CARDIAC) 20 MG/ML IV SOLN
INTRAVENOUS | Status: DC | PRN
  Administered 2015-08-15: 80 mg via INTRAVENOUS

## 2015-08-15 MED ORDER — FENTANYL CITRATE (PF) 100 MCG/2ML IJ SOLN
25.0000 ug | INTRAMUSCULAR | Status: DC | PRN
  Filled 2015-08-15: qty 1

## 2015-08-15 MED ORDER — LIDOCAINE HCL (CARDIAC) 20 MG/ML IV SOLN
INTRAVENOUS | Status: AC
  Filled 2015-08-15: qty 5

## 2015-08-15 MED ORDER — CEFAZOLIN SODIUM 1-5 GM-% IV SOLN
1.0000 g | Freq: Once | INTRAVENOUS | Status: DC
  Filled 2015-08-15: qty 50

## 2015-08-15 MED ORDER — PROPOFOL 10 MG/ML IV BOLUS
INTRAVENOUS | Status: AC
  Filled 2015-08-15: qty 40

## 2015-08-15 MED ORDER — CEFAZOLIN SODIUM-DEXTROSE 2-3 GM-% IV SOLR
INTRAVENOUS | Status: AC
  Filled 2015-08-15: qty 50

## 2015-08-15 MED ORDER — DEXAMETHASONE SODIUM PHOSPHATE 4 MG/ML IJ SOLN
INTRAMUSCULAR | Status: DC | PRN
  Administered 2015-08-15: 10 mg via INTRAVENOUS

## 2015-08-15 MED ORDER — LACTATED RINGERS IV SOLN
INTRAVENOUS | Status: DC
  Administered 2015-08-15: 10:00:00 via INTRAVENOUS
  Filled 2015-08-15: qty 1000

## 2015-08-15 MED ORDER — FENTANYL CITRATE (PF) 100 MCG/2ML IJ SOLN
INTRAMUSCULAR | Status: DC | PRN
  Administered 2015-08-15: 50 ug via INTRAVENOUS

## 2015-08-15 MED ORDER — KETOROLAC TROMETHAMINE 15 MG/ML IJ SOLN
INTRAMUSCULAR | Status: DC | PRN
  Administered 2015-08-15: 15 mg via INTRAVENOUS

## 2015-08-15 MED ORDER — DEXAMETHASONE SODIUM PHOSPHATE 10 MG/ML IJ SOLN
INTRAMUSCULAR | Status: AC
  Filled 2015-08-15: qty 1

## 2015-08-15 MED ORDER — ONDANSETRON HCL 4 MG/2ML IJ SOLN
INTRAMUSCULAR | Status: AC
Start: 2015-08-15 — End: 2015-08-15
  Filled 2015-08-15: qty 2

## 2015-08-15 MED ORDER — MIDAZOLAM HCL 2 MG/2ML IJ SOLN
INTRAMUSCULAR | Status: AC
  Filled 2015-08-15: qty 2

## 2015-08-15 SURGICAL SUPPLY — 37 items
BAG DRAIN URO-CYSTO SKYTR STRL (DRAIN) ×3 IMPLANT
BAG DRN ANRFLXCHMBR STRAP LEK (BAG)
BAG DRN UROCATH (DRAIN) ×1
BAG URINE DRAINAGE (UROLOGICAL SUPPLIES) ×2 IMPLANT
BAG URINE LEG 19OZ MD ST LTX (BAG) IMPLANT
BAG URINE LEG 500ML (DRAIN) ×2 IMPLANT
CANISTER SUCT LVC 12 LTR MEDI- (MISCELLANEOUS) IMPLANT
CATH FOLEY 2WAY SLVR  5CC 16FR (CATHETERS) ×2
CATH FOLEY 2WAY SLVR  5CC 20FR (CATHETERS)
CATH FOLEY 2WAY SLVR  5CC 22FR (CATHETERS)
CATH FOLEY 2WAY SLVR 5CC 16FR (CATHETERS) IMPLANT
CATH FOLEY 2WAY SLVR 5CC 20FR (CATHETERS) IMPLANT
CATH FOLEY 2WAY SLVR 5CC 22FR (CATHETERS) IMPLANT
CLOTH BEACON ORANGE TIMEOUT ST (SAFETY) ×3 IMPLANT
DRSG TELFA 3X8 NADH (GAUZE/BANDAGES/DRESSINGS) IMPLANT
ELECT BUTTON BIOP 24F 90D PLAS (MISCELLANEOUS) IMPLANT
ELECT REM PT RETURN 9FT ADLT (ELECTROSURGICAL) ×3
ELECTRODE REM PT RTRN 9FT ADLT (ELECTROSURGICAL) ×1 IMPLANT
EVACUATOR MICROVAS BLADDER (UROLOGICAL SUPPLIES) IMPLANT
GLOVE BIO SURGEON STRL SZ7.5 (GLOVE) ×3 IMPLANT
GOWN STRL REUS W/ TWL XL LVL3 (GOWN DISPOSABLE) ×1 IMPLANT
GOWN STRL REUS W/TWL XL LVL3 (GOWN DISPOSABLE) ×3
HOLDER FOLEY CATH W/STRAP (MISCELLANEOUS) IMPLANT
IV NS 1000ML (IV SOLUTION) ×6
IV NS 1000ML BAXH (IV SOLUTION) IMPLANT
IV NS IRRIG 3000ML ARTHROMATIC (IV SOLUTION) ×2 IMPLANT
KIT ROOM TURNOVER WOR (KITS) ×3 IMPLANT
LOOP CUT BIPOLAR 24F LRG (ELECTROSURGICAL) ×2 IMPLANT
MANIFOLD NEPTUNE II (INSTRUMENTS) IMPLANT
NEEDLE HYPO 22GX1.5 SAFETY (NEEDLE) IMPLANT
NS IRRIG 500ML POUR BTL (IV SOLUTION) IMPLANT
PACK CYSTO (CUSTOM PROCEDURE TRAY) ×3 IMPLANT
PAD DRESSING TELFA 3X8 NADH (GAUZE/BANDAGES/DRESSINGS) IMPLANT
PLUG CATH AND CAP STER (CATHETERS) IMPLANT
SET ASPIRATION TUBING (TUBING) IMPLANT
TUBE CONNECTING 12'X1/4 (SUCTIONS)
TUBE CONNECTING 12X1/4 (SUCTIONS) IMPLANT

## 2015-08-15 NOTE — Op Note (Signed)
Preoperative diagnosis: Urothelial carcinoma left bladder Postoperative diagnosis: Same  Procedure: TURBT 2-5 cm  Surgeon: Junious Silk  Anesthesia: Gen.  Indication for procedure: 64 year old status post TURBT about 5 weeks ago. Pathology of the primary tumor revealed a high-grade TA lesion with focal T1 and no muscle and the specimen. She was brought back today for repeat resection to ensure complete resection into sample the muscle. The areas biopsied adjacent to the tumor showed early papillary formations but no malignancy.  Findings: On exam under anesthesia the bladder was palpably normal transvaginally. No masses were palpable. On cystoscopy the urethra was normal, the trigone and ureteral orifices were normal there was clear efflux. There were no stones or foreign bodies in the bladder. The prior resection site was healing well with a central granulation tissue. This was completely resected and the prior medial biopsy site lightly fulgurated. Resection was down to muscle and was quite thin in several areas but no perforation.   Description of procedure: After consent was obtained patient brought to the operating room. After adequate anesthesia she was placed in lithotomy position and prepped and draped in the usual sterile fashion. A timeout was performed to confirm the patient and procedure. The prior tumor site was resected with the loop down to muscle. Hemostasis was obtained. The left ureteral orifice was at the inferior edge of the edema and erythema from the prior resection and was not resected. A lightly fulgurated the prior medial biopsy site. Hemostasis was excellent at low-pressure. All the chips were collected. The bladder was refilled and the scope removed. A 16 French Foley was placed and the balloon was seated at the bladder neck on palpation. Urine draining was clear. She was awakened and taken to the recovery room in stable condition.  Complications: None  Blood loss:  Minimal  Specimens: None  Drains: 16 French Foley. Patient removed her prior Foley at home and did well. Will teach her to do this again in three days.

## 2015-08-15 NOTE — Transfer of Care (Signed)
Immediate Anesthesia Transfer of Care Note  Patient: Jennifer Pineda  Procedure(s) Performed: Procedure(s): CYSTOSCOPY WITH BLADDER RESECTION (N/A) TRANSURETHRAL RESECTION OF BLADDER TUMOR (TURBT) (N/A)  Patient Location: PACU  Anesthesia Type:General  Level of Consciousness: awake, alert , oriented and patient cooperative  Airway & Oxygen Therapy: Patient Spontanous Breathing and Patient connected to nasal cannula oxygen  Post-op Assessment: Report given to RN and Post -op Vital signs reviewed and stable  Post vital signs: Reviewed and stable  Last Vitals:  Filed Vitals:   08/15/15 0903  BP: 165/58  Pulse: 69  Temp: 36.9 C  Resp: 18    Complications: No apparent anesthesia complications

## 2015-08-15 NOTE — Interval H&P Note (Signed)
History and Physical Interval Note:  08/15/2015 10:09 AM  Jennifer Pineda  has presented today for surgery, with the diagnosis of urothelial cancer  The various methods of treatment have been discussed with the patient and family. After consideration of risks, benefits and other options for treatment, the patient has consented to  Procedure(s): CYSTOSCOPY WITH BLADDER BIOPSY (N/A) POSSIBLE TRANSURETHRAL RESECTION OF BLADDER TUMOR (TURBT) (N/A) as a surgical intervention .  The patient's history has been reviewed, patient examined, no change in status, stable for surgery.  I have reviewed the patient's chart and labs. She has been well. No dysuria, gross hematuria or fever.  Questions were answered to the patient's satisfaction.  She elects to proceed. Discussed possible need for foley over Christmas weekend.    Betsie Peckman

## 2015-08-15 NOTE — Anesthesia Preprocedure Evaluation (Addendum)
Anesthesia Evaluation  Patient identified by MRN, date of birth, ID band Patient awake    Reviewed: Allergy & Precautions, NPO status , Patient's Chart, lab work & pertinent test results  History of Anesthesia Complications (+) PONV and history of anesthetic complications  Airway Mallampati: II  TM Distance: >3 FB Neck ROM: Full    Dental no notable dental hx.    Pulmonary asthma , sleep apnea and Continuous Positive Airway Pressure Ventilation , COPD,  COPD inhaler, former smoker,    Pulmonary exam normal breath sounds clear to auscultation       Cardiovascular Exercise Tolerance: Good hypertension, Pt. on medications + CAD  Normal cardiovascular exam Rhythm:Regular Rate:Normal     Neuro/Psych PSYCHIATRIC DISORDERS Depression negative neurological ROS     GI/Hepatic Neg liver ROS, hiatal hernia, GERD  Medicated,  Endo/Other  negative endocrine ROS  Renal/GU negative Renal ROS  negative genitourinary   Musculoskeletal  (+) Arthritis ,   Abdominal (+) + obese,   Peds negative pediatric ROS (+)  Hematology negative hematology ROS (+)   Anesthesia Other Findings   Reproductive/Obstetrics negative OB ROS                            Anesthesia Physical Anesthesia Plan  ASA: II  Anesthesia Plan: General   Post-op Pain Management:    Induction: Intravenous  Airway Management Planned: LMA  Additional Equipment:   Intra-op Plan:   Post-operative Plan: Extubation in OR  Informed Consent: I have reviewed the patients History and Physical, chart, labs and discussed the procedure including the risks, benefits and alternatives for the proposed anesthesia with the patient or authorized representative who has indicated his/her understanding and acceptance.   Dental advisory given  Plan Discussed with: CRNA  Anesthesia Plan Comments:         Anesthesia Quick Evaluation

## 2015-08-15 NOTE — Anesthesia Postprocedure Evaluation (Signed)
Anesthesia Post Note  Patient: Jennifer Pineda  Procedure(s) Performed: Procedure(s) (LRB): CYSTOSCOPY WITH BLADDER RESECTION (N/A) TRANSURETHRAL RESECTION OF BLADDER TUMOR (TURBT) (N/A)  Patient location during evaluation: PACU Anesthesia Type: General Level of consciousness: awake and alert Pain management: pain level controlled Vital Signs Assessment: post-procedure vital signs reviewed and stable Respiratory status: spontaneous breathing, nonlabored ventilation, respiratory function stable and patient connected to nasal cannula oxygen Cardiovascular status: blood pressure returned to baseline and stable Postop Assessment: no signs of nausea or vomiting Anesthetic complications: no    Last Vitals:  Filed Vitals:   08/15/15 1157 08/15/15 1200  BP:  159/63  Pulse: 65 66  Temp:    Resp: 14 17    Last Pain: There were no vitals filed for this visit.               Rhet Rorke J

## 2015-08-15 NOTE — Anesthesia Procedure Notes (Signed)
Procedure Name: LMA Insertion Date/Time: 08/15/2015 10:38 AM Performed by: Wanita Chamberlain Pre-anesthesia Checklist: Patient identified, Timeout performed, Emergency Drugs available, Suction available and Patient being monitored Patient Re-evaluated:Patient Re-evaluated prior to inductionOxygen Delivery Method: Circle system utilized Preoxygenation: Pre-oxygenation with 100% oxygen Intubation Type: IV induction Ventilation: Mask ventilation without difficulty LMA: LMA inserted LMA Size: 4.0 Number of attempts: 1 Placement Confirmation: positive ETCO2 and breath sounds checked- equal and bilateral Tube secured with: Tape Dental Injury: Teeth and Oropharynx as per pre-operative assessment

## 2015-08-15 NOTE — Discharge Instructions (Signed)
Transurethral Procedure  Medications: Resume all your other meds from home  Activity: 1. No heavy lifting > 10 pounds for 2 weeks. 2. No sexual activity for 2 weeks. 3. No strenuous activity for 2 weeks. 4. No driving while on narcotic pain medications. 5. Drink plenty of water. 6. Continue to walk at home - you can still get blood clots when you are at home so keep     active but don't over do it. 7. Your urine may have some blood in it - make sure you drink plenty of water. Call or           come to the ER immediately if you catheter stops draining or you are unable to urinate.  Bathing: You can shower. You can take a bath unless you have a foley catheter in place.  Signs / Symptoms to call: 1. Call if you have a fever greater than 101.5  2. Uncontrolled nausea / vomiting, uncontrolled pain / dizziness, unable to urinate, leg swelling / leg pain, or any other concerns.   You can reach [place] at [phone]       Foley Catheter Care, Adult A Foley catheter is a soft, flexible tube. This tube is placed into your bladder to drain pee (urine). If you go home with this catheter in place, follow the instructions below.  REMOVAL OF CATHETER: Remove catheter as instructed morning of Aug 20, 2015, Sunday.    TAKING CARE OF THE CATHETER 1. Wash your hands with soap and water. 2. Put soap and water on a clean washcloth.  Clean the skin where the tube goes into your body.  Clean away from the tube site.  Never wipe toward the tube.  Clean the area using a circular motion.  Remove all the soap. Pat the area dry with a clean towel. For males, reposition the skin that covers the end of the penis (foreskin). 3. Attach the tube to your leg with tape or a leg strap. Do not stretch the tube tight. If you are using tape, remove any stickiness left behind by past tape you used. 4. Keep the drainage bag below your hips. Keep it off the  floor. 5. Check your tube during the day. Make sure it is working and draining. Make sure the tube does not curl, twist, or bend. 6. Do not pull on the tube or try to take it out. TAKING CARE OF THE DRAINAGE BAGS You will have a large overnight drainage bag and a small leg bag. You may wear the overnight bag any time. Never wear the small bag at night. Follow the directions below. Emptying the Drainage Bag Empty your drainage bag when it is  - full or at least 2-3 times a day. 1. Wash your hands with soap and water. 2. Keep the drainage bag below your hips. 3. Hold the dirty bag over the toilet or clean container. 4. Open the pour spout at the bottom of the bag. Empty the pee into the toilet or container. Do not let the pour spout touch anything. 5. Clean the pour spout with a gauze pad or cotton ball that has rubbing alcohol on it. 6. Close the pour spout. 7. Attach the bag to your leg with tape or a leg strap. 8. Wash your hands well. Changing the Drainage Bag Change your bag once a month or sooner if it starts to smell or look dirty.  1. Wash your hands with soap and water. 2. Pinch the rubber  tube so that pee does not spill out. 3. Disconnect the catheter tube from the drainage tube at the connection valve. Do not let the tubes touch anything. 4. Clean the end of the catheter tube with an alcohol wipe. Clean the end of a the drainage tube with a different alcohol wipe. 5. Connect the catheter tube to the drainage tube of the clean drainage bag. 6. Attach the new bag to the leg with tape or a leg strap. Avoid attaching the new bag too tightly. 7. Wash your hands well. Cleaning the Drainage Bag 1. Wash your hands with soap and water. 2. Wash the bag in warm, soapy water. 3. Rinse the bag with warm water. 4. Fill the bag with a mixture of white vinegar and water (1 cup vinegar to 1 quart warm water [.2 liter vinegar to 1 liter warm water]). Close the bag and soak it for 30 minutes in  the solution. 5. Rinse the bag with warm water. 6. Hang the bag to dry with the pour spout open and hanging downward. 7. Store the clean bag (once it is dry) in a clean plastic bag. 8. Wash your hands well. PREVENT INFECTION  Wash your hands before and after touching your tube.  Take showers every day. Wash the skin where the tube enters your body. Do not take baths. Replace wet leg straps with dry ones, if this applies.  Do not use powders, sprays, or lotions on the genital area. Only use creams, lotions, or ointments as told by your doctor.  For females, wipe from front to back after going to the bathroom.  Drink enough fluids to keep your pee clear or pale yellow unless you are told not to have too much fluid (fluid restriction).  Do not let the drainage bag or tubing touch or lie on the floor.  Wear cotton underwear to keep the area dry. GET HELP IF:  Your pee is cloudy or smells unusually bad.  Your tube becomes clogged.  You are not draining pee into the bag or your bladder feels full.  Your tube starts to leak. GET HELP RIGHT AWAY IF:  You have pain, puffiness (swelling), redness, or yellowish-white fluid (pus) where the tube enters the body.  You have pain in the belly (abdomen), legs, lower back, or bladder.  You have a fever.  You see blood fill the tube, or your pee is pink or red.  You feel sick to your stomach (nauseous), throw up (vomit), or have chills.  Your tube gets pulled out. MAKE SURE YOU:   Understand these instructions.  Will watch your condition.  Will get help right away if you are not doing well or get worse.   This information is not intended to replace advice given to you by your health care provider. Make sure you discuss any questions you have with your health care provider.   Document Released: 12/07/2012 Document Revised: 09/02/2014 Document Reviewed: 12/07/2012 Elsevier Interactive Patient Education 2016 [place] Instructions  Activity: Get plenty of rest for the remainder of the day. A responsible adult should stay with you for 24 hours following the procedure.  For the next 24 hours, DO NOT: -Drive a car -Paediatric nurse -Drink alcoholic beverages -Take any medication unless instructed by your physician -Make any legal decisions or sign important papers.  Meals: Start with liquid foods such as gelatin or soup. Progress to regular foods as tolerated. Avoid greasy,  spicy, heavy foods. If nausea and/or vomiting occur, drink only clear liquids until the nausea and/or vomiting subsides. Call your physician if vomiting continues.  Special Instructions/Symptoms: Your throat may feel dry or sore from the anesthesia or the breathing tube placed in your throat during surgery. If this causes discomfort, gargle with warm salt water. The discomfort should disappear within 24 hours.  If you had a scopolamine patch placed behind your ear for the management of post- operative nausea and/or vomiting:  1. The medication in the patch is effective for 72 hours, after which it should be removed.  Wrap patch in a tissue and discard in the trash. Wash hands thoroughly with soap and water. 2. You may remove the patch earlier than 72 hours if you experience unpleasant side effects which may include dry mouth, dizziness or visual disturbances. 3. Avoid touching the patch. Wash your hands with soap and water after contact with the patch.

## 2015-08-16 ENCOUNTER — Encounter (HOSPITAL_BASED_OUTPATIENT_CLINIC_OR_DEPARTMENT_OTHER): Payer: Self-pay | Admitting: Urology

## 2015-08-17 ENCOUNTER — Encounter (HOSPITAL_BASED_OUTPATIENT_CLINIC_OR_DEPARTMENT_OTHER): Payer: Self-pay | Admitting: Family Medicine

## 2015-08-21 ENCOUNTER — Other Ambulatory Visit: Payer: Self-pay | Admitting: Family Medicine

## 2015-09-01 ENCOUNTER — Other Ambulatory Visit (INDEPENDENT_AMBULATORY_CARE_PROVIDER_SITE_OTHER): Payer: PPO

## 2015-09-01 ENCOUNTER — Other Ambulatory Visit: Payer: Self-pay | Admitting: Family Medicine

## 2015-09-01 DIAGNOSIS — I10 Essential (primary) hypertension: Secondary | ICD-10-CM

## 2015-09-01 DIAGNOSIS — R7303 Prediabetes: Secondary | ICD-10-CM

## 2015-09-01 DIAGNOSIS — E7800 Pure hypercholesterolemia, unspecified: Secondary | ICD-10-CM

## 2015-09-01 DIAGNOSIS — C689 Malignant neoplasm of urinary organ, unspecified: Secondary | ICD-10-CM

## 2015-09-01 LAB — COMPREHENSIVE METABOLIC PANEL
ALBUMIN: 3.7 g/dL (ref 3.5–5.2)
ALK PHOS: 98 U/L (ref 39–117)
ALT: 34 U/L (ref 0–35)
AST: 27 U/L (ref 0–37)
BILIRUBIN TOTAL: 0.5 mg/dL (ref 0.2–1.2)
BUN: 13 mg/dL (ref 6–23)
CALCIUM: 9.4 mg/dL (ref 8.4–10.5)
CO2: 30 mEq/L (ref 19–32)
CREATININE: 0.78 mg/dL (ref 0.40–1.20)
Chloride: 100 mEq/L (ref 96–112)
GFR: 78.45 mL/min (ref >60.00)
Glucose, Bld: 141 mg/dL — ABNORMAL HIGH (ref 70–99)
Potassium: 3.6 mEq/L (ref 3.5–5.1)
Sodium: 139 mEq/L (ref 135–145)
Total Protein: 7.1 g/dL (ref 6.0–8.3)

## 2015-09-01 LAB — CBC WITH DIFFERENTIAL/PLATELET
BASOS ABS: 0 10*3/uL (ref 0.0–0.1)
Basophils Relative: 0.5 % (ref 0.0–3.0)
EOS ABS: 0.5 10*3/uL (ref 0.0–0.7)
EOS PCT: 5.5 % — AB (ref 0.0–5.0)
HCT: 36.9 % (ref 36.0–46.0)
HEMOGLOBIN: 11.9 g/dL — AB (ref 12.0–15.0)
LYMPHS ABS: 2.1 10*3/uL (ref 0.7–4.0)
Lymphocytes Relative: 25.2 % (ref 12.0–46.0)
MCHC: 32.2 g/dL (ref 30.0–36.0)
MCV: 80.4 fl (ref 78.0–100.0)
MONO ABS: 0.7 10*3/uL (ref 0.1–1.0)
Monocytes Relative: 8 % (ref 3.0–12.0)
NEUTROS PCT: 60.8 % (ref 43.0–77.0)
Neutro Abs: 5.1 10*3/uL (ref 1.4–7.7)
Platelets: 285 10*3/uL (ref 150.0–400.0)
RBC: 4.59 Mil/uL (ref 3.87–5.11)
RDW: 16.2 % — ABNORMAL HIGH (ref 11.5–15.5)
WBC: 8.4 10*3/uL (ref 4.0–10.5)

## 2015-09-01 LAB — LIPID PANEL
CHOL/HDL RATIO: 3
CHOLESTEROL: 170 mg/dL (ref 0–200)
HDL: 49.1 mg/dL (ref >39.00)
LDL Cholesterol: 90 mg/dL (ref 0–99)
NonHDL: 120.85
TRIGLYCERIDES: 156 mg/dL — AB (ref 0.0–149.0)
VLDL: 31.2 mg/dL (ref 0.0–40.0)

## 2015-09-01 LAB — HEMOGLOBIN A1C: HEMOGLOBIN A1C: 6.7 % — AB (ref 4.6–6.5)

## 2015-09-01 LAB — TSH: TSH: 1.76 u[IU]/mL (ref 0.35–4.50)

## 2015-09-08 ENCOUNTER — Encounter: Payer: Self-pay | Admitting: Family Medicine

## 2015-09-08 ENCOUNTER — Ambulatory Visit (INDEPENDENT_AMBULATORY_CARE_PROVIDER_SITE_OTHER): Payer: PPO | Admitting: Family Medicine

## 2015-09-08 VITALS — BP 154/54 | HR 76 | Temp 98.1°F | Ht 63.5 in | Wt 195.5 lb

## 2015-09-08 DIAGNOSIS — E2839 Other primary ovarian failure: Secondary | ICD-10-CM

## 2015-09-08 DIAGNOSIS — Z23 Encounter for immunization: Secondary | ICD-10-CM | POA: Diagnosis not present

## 2015-09-08 DIAGNOSIS — Z7189 Other specified counseling: Secondary | ICD-10-CM | POA: Insufficient documentation

## 2015-09-08 DIAGNOSIS — J0100 Acute maxillary sinusitis, unspecified: Secondary | ICD-10-CM

## 2015-09-08 DIAGNOSIS — F341 Dysthymic disorder: Secondary | ICD-10-CM

## 2015-09-08 DIAGNOSIS — C689 Malignant neoplasm of urinary organ, unspecified: Secondary | ICD-10-CM

## 2015-09-08 DIAGNOSIS — R937 Abnormal findings on diagnostic imaging of other parts of musculoskeletal system: Secondary | ICD-10-CM

## 2015-09-08 DIAGNOSIS — J449 Chronic obstructive pulmonary disease, unspecified: Secondary | ICD-10-CM

## 2015-09-08 DIAGNOSIS — J40 Bronchitis, not specified as acute or chronic: Secondary | ICD-10-CM | POA: Insufficient documentation

## 2015-09-08 DIAGNOSIS — Z0000 Encounter for general adult medical examination without abnormal findings: Secondary | ICD-10-CM

## 2015-09-08 DIAGNOSIS — E7800 Pure hypercholesterolemia, unspecified: Secondary | ICD-10-CM

## 2015-09-08 DIAGNOSIS — I10 Essential (primary) hypertension: Secondary | ICD-10-CM

## 2015-09-08 DIAGNOSIS — E119 Type 2 diabetes mellitus without complications: Secondary | ICD-10-CM | POA: Diagnosis not present

## 2015-09-08 MED ORDER — ATORVASTATIN CALCIUM 10 MG PO TABS: ORAL_TABLET | ORAL | Status: DC

## 2015-09-08 MED ORDER — TRIAMTERENE-HCTZ 37.5-25 MG PO TABS: ORAL_TABLET | ORAL | Status: DC

## 2015-09-08 MED ORDER — FLUOXETINE HCL 10 MG PO CAPS: 10.0000 mg | ORAL_CAPSULE | Freq: Every day | ORAL | Status: DC

## 2015-09-08 MED ORDER — AMOXICILLIN 875 MG PO TABS: 875.0000 mg | ORAL_TABLET | Freq: Two times a day (BID) | ORAL | Status: DC

## 2015-09-08 MED ORDER — OMEPRAZOLE 40 MG PO CPDR: DELAYED_RELEASE_CAPSULE | ORAL | Status: DC

## 2015-09-08 MED ORDER — AMLODIPINE BESYLATE 10 MG PO TABS: ORAL_TABLET | ORAL | Status: DC

## 2015-09-08 NOTE — Assessment & Plan Note (Signed)
Pt slowly tapering off menest

## 2015-09-08 NOTE — Assessment & Plan Note (Signed)
Reviewed recent dx and treatment with patient.

## 2015-09-08 NOTE — Patient Instructions (Addendum)
Pneumovax today. Flu shot today.  Call to schedule mammogram at [facility] [phone]. Medicines refilled today. We will refer you to diabetes education - pass by our referral coordinators if able.  For sinuses - continue current regimen. Add amoxicillin course - let us know if diarrhea develops.  Good to see you today, call us with quesitons.  Health Maintenance, Female Adopting a healthy lifestyle and getting preventive care can go a long way to promote health and wellness. Talk with your health care provider about what schedule of regular examinations is right for you. This is a good chance for you to check in with your provider about disease prevention and staying healthy. In between checkups, there are plenty of things you can do on your own. Experts have done a lot of research about which lifestyle changes and preventive measures are most likely to keep you healthy. Ask your health care provider for more information. WEIGHT AND DIET  Eat a healthy diet  Be sure to include plenty of vegetables, fruits, low-fat dairy products, and lean protein.  Do not eat a lot of foods high in solid fats, added sugars, or salt.  Get regular exercise. This is one of the most important things you can do for your health.  Most adults should exercise for at least 150 minutes each week. The exercise should increase your heart rate and make you sweat (moderate-intensity exercise).  Most adults should also do strengthening exercises at least twice a week. This is in addition to the moderate-intensity exercise.  Maintain a healthy weight  Body mass index (BMI) is a measurement that can be used to identify possible weight problems. It estimates body fat based on height and weight. Your health care provider can help determine your BMI and help you achieve or maintain a healthy weight.  For females 80 years of age and older:   A BMI below 18.5 is considered underweight.  A BMI of 18.5 to  24.9 is normal.  A BMI of 25 to 29.9 is considered overweight.  A BMI of 30 and above is considered obese.  Watch levels of cholesterol and blood lipids  You should start having your blood tested for lipids and cholesterol at 67 years of age, then have this test every 5 years.  You may need to have your cholesterol levels checked more often if:  Your lipid or cholesterol levels are high.  You are older than 67 years of age.  You are at high risk for heart disease.  CANCER SCREENING   Lung Cancer  Lung cancer screening is recommended for adults 23-51 years old who are at high risk for lung cancer because of a history of smoking.  A yearly low-dose CT scan of the lungs is recommended for people who:  Currently smoke.  Have quit within the past 15 years.  Have at least a 30-pack-year history of smoking. A pack year is smoking an average of one pack of cigarettes a day for 1 year.  Yearly screening should continue until it has been 15 years since you quit.  Yearly screening should stop if you develop a health problem that would prevent you from having lung cancer treatment.  Breast Cancer  Practice breast self-awareness. This means understanding how your breasts normally appear and feel.  It also means doing regular breast self-exams. Let your health care provider know about any changes, no matter how small.  If you are in your 20s or 30s, you should have a  clinical breast exam (CBE) by a health care provider every 1-3 years as part of a regular health exam.  If you are 12 or older, have a CBE every year. Also consider having a breast X-ray (mammogram) every year.  If you have a family history of breast cancer, talk to your health care provider about genetic screening.  If you are at high risk for breast cancer, talk to your health care provider about having an MRI and a mammogram every year.  Breast cancer gene (BRCA) assessment is recommended for women who have family  members with BRCA-related cancers. BRCA-related cancers include:  Breast.  Ovarian.  Tubal.  Peritoneal cancers.  Results of the assessment will determine the need for genetic counseling and BRCA1 and BRCA2 testing. Cervical Cancer Your health care provider may recommend that you be screened regularly for cancer of the pelvic organs (ovaries, uterus, and vagina). This screening involves a pelvic examination, including checking for microscopic changes to the surface of your cervix (Pap test). You may be encouraged to have this screening done every 3 years, beginning at age 64.  For women ages 17-65, health care providers may recommend pelvic exams and Pap testing every 3 years, or they may recommend the Pap and pelvic exam, combined with testing for human papilloma virus (HPV), every 5 years. Some types of HPV increase your risk of cervical cancer. Testing for HPV may also be done on women of any age with unclear Pap test results.  Other health care providers may not recommend any screening for nonpregnant women who are considered low risk for pelvic cancer and who do not have symptoms. Ask your health care provider if a screening pelvic exam is right for you.  If you have had past treatment for cervical cancer or a condition that could lead to cancer, you need Pap tests and screening for cancer for at least 20 years after your treatment. If Pap tests have been discontinued, your risk factors (such as having a new sexual partner) need to be reassessed to determine if screening should resume. Some women have medical problems that increase the chance of getting cervical cancer. In these cases, your health care provider may recommend more frequent screening and Pap tests. Colorectal Cancer  This type of cancer can be detected and often prevented.  Routine colorectal cancer screening usually begins at 67 years of age and continues through 67 years of age.  Your health care provider may recommend  screening at an earlier age if you have risk factors for colon cancer.  Your health care provider may also recommend using home test kits to check for hidden blood in the stool.  A small camera at the end of a tube can be used to examine your colon directly (sigmoidoscopy or colonoscopy). This is done to check for the earliest forms of colorectal cancer.  Routine screening usually begins at age 4.  Direct examination of the colon should be repeated every 5-10 years through 67 years of age. However, you may need to be screened more often if early forms of precancerous polyps or small growths are found. Skin Cancer  Check your skin from head to toe regularly.  Tell your health care provider about any new moles or changes in moles, especially if there is a change in a mole's shape or color.  Also tell your health care provider if you have a mole that is larger than the size of a pencil eraser.  Always use sunscreen. Apply sunscreen liberally  and repeatedly throughout the day.  Protect yourself by wearing long sleeves, pants, a wide-brimmed hat, and sunglasses whenever you are outside. HEART DISEASE, DIABETES, AND HIGH BLOOD PRESSURE   High blood pressure causes heart disease and increases the risk of stroke. High blood pressure is more likely to develop in:  People who have blood pressure in the high end of the normal range (130-139/85-89 mm Hg).  People who are overweight or obese.  People who are African American.  If you are 18-39 years of age, have your blood pressure checked every 3-5 years. If you are 40 years of age or older, have your blood pressure checked every year. You should have your blood pressure measured twice--once when you are at a hospital or clinic, and once when you are not at a hospital or clinic. Record the average of the two measurements. To check your blood pressure when you are not at a hospital or clinic, you can use:  An automated blood pressure machine at a  pharmacy.  A home blood pressure monitor.  If you are between 55 years and 79 years old, ask your health care provider if you should take aspirin to prevent strokes.  Have regular diabetes screenings. This involves taking a blood sample to check your fasting blood sugar level.  If you are at a normal weight and have a low risk for diabetes, have this test once every three years after 67 years of age.  If you are overweight and have a high risk for diabetes, consider being tested at a younger age or more often. PREVENTING INFECTION  Hepatitis B  If you have a higher risk for hepatitis B, you should be screened for this virus. You are considered at high risk for hepatitis B if:  You were born in a country where hepatitis B is common. Ask your health care provider which countries are considered high risk.  Your parents were born in a high-risk country, and you have not been immunized against hepatitis B (hepatitis B vaccine).  You have HIV or AIDS.  You use needles to inject street drugs.  You live with someone who has hepatitis B.  You have had sex with someone who has hepatitis B.  You get hemodialysis treatment.  You take certain medicines for conditions, including cancer, organ transplantation, and autoimmune conditions. Hepatitis C  Blood testing is recommended for:  Everyone born from 1945 through 1965.  Anyone with known risk factors for hepatitis C. Sexually transmitted infections (STIs)  You should be screened for sexually transmitted infections (STIs) including gonorrhea and chlamydia if:  You are sexually active and are younger than 67 years of age.  You are older than 67 years of age and your health care provider tells you that you are at risk for this type of infection.  Your sexual activity has changed since you were last screened and you are at an increased risk for chlamydia or gonorrhea. Ask your health care provider if you are at risk.  If you do not  have HIV, but are at risk, it may be recommended that you take a prescription medicine daily to prevent HIV infection. This is called pre-exposure prophylaxis (PrEP). You are considered at risk if:  You are sexually active and do not regularly use condoms or know the HIV status of your partner(s).  You take drugs by injection.  You are sexually active with a partner who has HIV. Talk with your health care provider about whether you are   at high risk of being infected with HIV. If you choose to begin PrEP, you should first be tested for HIV. You should then be tested every 3 months for as long as you are taking PrEP.  PREGNANCY   If you are premenopausal and you may become pregnant, ask your health care provider about preconception counseling.  If you may become pregnant, take 400 to 800 micrograms (mcg) of folic acid every day.  If you want to prevent pregnancy, talk to your health care provider about birth control (contraception). OSTEOPOROSIS AND MENOPAUSE   Osteoporosis is a disease in which the bones lose minerals and strength with aging. This can result in serious bone fractures. Your risk for osteoporosis can be identified using a bone density scan.  If you are 65 years of age or older, or if you are at risk for osteoporosis and fractures, ask your health care provider if you should be screened.  Ask your health care provider whether you should take a calcium or vitamin D supplement to lower your risk for osteoporosis.  Menopause may have certain physical symptoms and risks.  Hormone replacement therapy may reduce some of these symptoms and risks. Talk to your health care provider about whether hormone replacement therapy is right for you.  HOME CARE INSTRUCTIONS   Schedule regular health, dental, and eye exams.  Stay current with your immunizations.   Do not use any tobacco products including cigarettes, chewing tobacco, or electronic cigarettes.  If you are pregnant, do not  drink alcohol.  If you are breastfeeding, limit how much and how often you drink alcohol.  Limit alcohol intake to no more than 1 drink per day for nonpregnant women. One drink equals 12 ounces of beer, 5 ounces of wine, or 1 ounces of hard liquor.  Do not use street drugs.  Do not share needles.  Ask your health care provider for help if you need support or information about quitting drugs.  Tell your health care provider if you often feel depressed.  Tell your health care provider if you have ever been abused or do not feel safe at home.   This information is not intended to replace advice given to you by your health care provider. Make sure you discuss any questions you have with your health care provider.   Document Released: 02/25/2011 Document Revised: 09/02/2014 Document Reviewed: 07/14/2013 Elsevier Interactive Patient Education 2016 [employer]    

## 2015-09-08 NOTE — Assessment & Plan Note (Signed)
Chronic, stable. Continue low dose lipitor.  

## 2015-09-08 NOTE — Assessment & Plan Note (Signed)

## 2015-09-08 NOTE — Assessment & Plan Note (Signed)
A1c now increased to diabetes range. Discussed with patient. Discussed foot care, eye care.  Will refer to DM education. RTC 3 mo DM f/u.

## 2015-09-08 NOTE — Assessment & Plan Note (Signed)
Trouble affording resp meds. Suggested pt look into available coupons online.

## 2015-09-08 NOTE — Assessment & Plan Note (Signed)
Chronic, stable on low dose prozac 10mg  daily - desires to continue.

## 2015-09-08 NOTE — Assessment & Plan Note (Signed)
Preventative protocols reviewed and updated unless pt declined. Discussed healthy diet and lifestyle.  

## 2015-09-08 NOTE — Assessment & Plan Note (Signed)
Given duration of illness will treat with amoxicillin course. Continue other treatment as up to now. Pt agrees with plan.

## 2015-09-08 NOTE — Progress Notes (Signed)
Pre visit review using our clinic review tool, if applicable. No additional management support is needed unless otherwise documented below in the visit note. 

## 2015-09-08 NOTE — Addendum Note (Signed)
Addended by: Royann Shivers A on: 09/08/2015 03:31 PM   Modules accepted: Orders

## 2015-09-08 NOTE — Progress Notes (Signed)
BP 154/54 mmHg  Pulse 76  Temp(Src) 98.1 F (36.7 C) (Oral)  Ht 5' 3.5" (1.613 m)  Wt 195 lb 8 oz (88.678 kg)  BMI 34.08 kg/m2   CC: medicare wellness visit  Subjective:    Patient ID: MAKINZI KEMMERLING, female    DOB: 09/21/1950, 65 y.o.   MRN: [id number]  HPI: BRISTYL HEIDT is a 65 y.o. female presenting on 09/08/2015 for Annual Exam   2 wk h/o sinus congestion and deep rattling productive cough. No fevers. Some R maxillary pain.  L thumb and index fingers getting numb. Worse at night time.   Recent urothelial cancer diagnosis after found to have painless hematuria. S/p TURBT 07/2015 ([facility]).   Passes hearing and vision screens.  Denies falls in last year.  Denies depression/anhedonia, sadness. Last year we decreased prozac to 10mg  daily.   Preventative: COLONOSCOPY Date: 09/2012 small int hem Carlean Purl) rpt 10 yrs Mammogram 06/2014 [facility].  Last pap was 2010, all normal. Hysterectomy age 29yo. Pelvic exam today.  DEXA 06/2014 - WNL Flu shot - declines Td 2005, Tdap 2015 Flu - today prevnar 2015, pneumovax today Shingles - 2015 Advanced directives - would like packet. Would want son [name]) to be HCPOA. No prolonged life support if terminal condition. Full code, ok with temporary ventilation.  Seat belt use discussed Sunscreen use discussed. No changing moles on skin.   Divorced Daughter [name], lives with her Children: 2 children // one daughter lives with mother/ son lives with father Activity: 20 min walk daily Diet: good water, fruits/vegetables daily.   Relevant past medical, surgical, family and social history reviewed and updated as indicated. Interim medical history since our last visit reviewed. Allergies and medications reviewed and updated. Current Outpatient Prescriptions on File Prior to Visit  Medication Sig  . albuterol (PROVENTIL HFA;VENTOLIN HFA) 108 (90 BASE) MCG/ACT inhaler Inhale 2 puffs into the lungs every 6 (six)  hours as needed. For shortness of breath.  . fluticasone (FLONASE) 50 MCG/ACT nasal spray USE TWO SPRAY(S) IN EACH NOSTRIL ONCE DAILY  . guaifenesin (HUMIBID E) 400 MG TABS Take 400 mg by mouth every morning.   . loratadine (CLARITIN) 10 MG tablet TAKE ONE TABLET BY MOUTH ONCE DAILY  . MENEST 0.3 MG tablet TAKE ONE TABLET BY MOUTH ONCE DAILY (Patient taking differently: TAKE ONE-HALF TABLET BY MOUTH ONCE DAILY--  takes in am)  . Tiotropium Bromide Monohydrate (SPIRIVA RESPIMAT) 2.5 MCG/ACT AERS Inhale 2 puffs every morning. Rinse/gargle after each use.  Marland Kitchen Fluticasone-Salmeterol (ADVAIR) 500-50 MCG/DOSE AEPB Inhale 1 puff into the lungs 2 (two) times daily. (Patient not taking: Reported on 09/08/2015)   No current facility-administered medications on file prior to visit.    Review of Systems  Constitutional: Negative for fever, chills, activity change, appetite change, fatigue and unexpected weight change.  HENT: Positive for congestion, sinus pressure and sore throat. Negative for hearing loss.   Eyes: Negative for visual disturbance.  Respiratory: Positive for cough (productive). Negative for chest tightness, shortness of breath and wheezing.   Cardiovascular: Negative for chest pain, palpitations and leg swelling.  Gastrointestinal: Negative for nausea, vomiting, abdominal pain, diarrhea, constipation, blood in stool and abdominal distention.  Genitourinary: Negative for hematuria and difficulty urinating.  Musculoskeletal: Negative for myalgias, arthralgias and neck pain.  Skin: Negative for rash.  Neurological: Negative for dizziness, seizures, syncope and headaches.  Hematological: Negative for adenopathy. Does not bruise/bleed easily.  Psychiatric/Behavioral: Negative for dysphoric mood. The patient is not nervous/anxious.  Per HPI unless specifically indicated in ROS section     Objective:    BP 154/54 mmHg  Pulse 76  Temp(Src) 98.1 F (36.7 C) (Oral)  Ht 5' 3.5" (1.613 m)   Wt 195 lb 8 oz (88.678 kg)  BMI 34.08 kg/m2  Wt Readings from Last 3 Encounters:  09/08/15 195 lb 8 oz (88.678 kg)  08/15/15 195 lb (88.451 kg)  07/11/15 193 lb (87.544 kg)    Physical Exam  Constitutional: She is oriented to person, place, and time. She appears well-developed and well-nourished. No distress.  HENT:  Head: Normocephalic and atraumatic.  Right Ear: Hearing, tympanic membrane, external ear and ear canal normal.  Left Ear: Hearing, tympanic membrane, external ear and ear canal normal.  Nose: Nose normal. No mucosal edema or rhinorrhea. Right sinus exhibits no maxillary sinus tenderness and no frontal sinus tenderness. Left sinus exhibits no maxillary sinus tenderness and no frontal sinus tenderness.  Mouth/Throat: Uvula is midline, oropharynx is clear and moist and mucous membranes are normal. No oropharyngeal exudate, posterior oropharyngeal edema, posterior oropharyngeal erythema or tonsillar abscesses.  Mild congestion bilaterally  Eyes: Conjunctivae and EOM are normal. Pupils are equal, round, and reactive to light. No scleral icterus.  Neck: Normal range of motion. Neck supple. Carotid bruit is not present. No thyromegaly present.  Cardiovascular: Normal rate, regular rhythm, normal heart sounds and intact distal pulses.   No murmur heard. Pulses:      Radial pulses are 2+ on the right side, and 2+ on the left side.  Pulmonary/Chest: Effort normal and breath sounds normal. No respiratory distress. She has no wheezes. She has no rales.  Abdominal: Soft. Bowel sounds are normal. She exhibits no distension and no mass. There is no tenderness. There is no rebound and no guarding.  Genitourinary: Vagina normal. Pelvic exam was performed with patient supine. There is no rash, tenderness, lesion or injury on the right labia. There is no rash, tenderness, lesion or injury on the left labia. Right adnexum displays no mass, no tenderness and no fullness. Left adnexum displays no  mass, no tenderness and no fullness.  Uterus/cervix absent bimanual only  Musculoskeletal: Normal range of motion. She exhibits no edema.  Lymphadenopathy:    She has no cervical adenopathy.  Neurological: She is alert and oriented to person, place, and time.  CN grossly intact, station and gait intact Recall 3/3 Calculation 5/5 serial 7s  Skin: Skin is warm and dry. No rash noted.  Psychiatric: She has a normal mood and affect. Her behavior is normal. Judgment and thought content normal.  Nursing note and vitals reviewed.  Results for orders placed or performed in visit on 09/01/15  Lipid panel  Result Value Ref Range   Cholesterol 170 0 - 200 mg/dL   Triglycerides 156.0 (H) 0.0 - 149.0 mg/dL   HDL 49.10 >39.00 mg/dL   VLDL 31.2 0.0 - 40.0 mg/dL   LDL Cholesterol 90 0 - 99 mg/dL   Total CHOL/HDL Ratio 3    NonHDL 120.85   Comprehensive metabolic panel  Result Value Ref Range   Sodium 139 135 - 145 mEq/L   Potassium 3.6 3.5 - 5.1 mEq/L   Chloride 100 96 - 112 mEq/L   CO2 30 19 - 32 mEq/L   Glucose, Bld 141 (H) 70 - 99 mg/dL   BUN 13 6 - 23 mg/dL   Creatinine, Ser 0.78 0.40 - 1.20 mg/dL   Total Bilirubin 0.5 0.2 - 1.2 mg/dL   Alkaline  Phosphatase 98 39 - 117 U/L   AST 27 0 - 37 U/L   ALT 34 0 - 35 U/L   Total Protein 7.1 6.0 - 8.3 g/dL   Albumin 3.7 3.5 - 5.2 g/dL   Calcium 9.4 8.4 - 10.5 mg/dL   GFR 78.45 >60.00 mL/min  Hemoglobin A1c  Result Value Ref Range   Hgb A1c MFr Bld 6.7 (H) 4.6 - 6.5 %  TSH  Result Value Ref Range   TSH 1.76 0.35 - 4.50 uIU/mL  CBC with Differential/Platelet  Result Value Ref Range   WBC 8.4 4.0 - 10.5 K/uL   RBC 4.59 3.87 - 5.11 Mil/uL   Hemoglobin 11.9 (L) 12.0 - 15.0 g/dL   HCT 36.9 36.0 - 46.0 %   MCV 80.4 78.0 - 100.0 fl   MCHC 32.2 30.0 - 36.0 g/dL   RDW 16.2 (H) 11.5 - 15.5 %   Platelets 285.0 150.0 - 400.0 K/uL   Neutrophils Relative % 60.8 43.0 - 77.0 %   Lymphocytes Relative 25.2 12.0 - 46.0 %   Monocytes Relative 8.0 3.0  - 12.0 %   Eosinophils Relative 5.5 (H) 0.0 - 5.0 %   Basophils Relative 0.5 0.0 - 3.0 %   Neutro Abs 5.1 1.4 - 7.7 K/uL   Lymphs Abs 2.1 0.7 - 4.0 K/uL   Monocytes Absolute 0.7 0.1 - 1.0 K/uL   Eosinophils Absolute 0.5 0.0 - 0.7 K/uL   Basophils Absolute 0.0 0.0 - 0.1 K/uL      Assessment & Plan:   Problem List Items Addressed This Visit    Urothelial carcinoma ([place])    Reviewed recent dx and treatment with patient.      Medicare annual wellness visit, initial - Primary    I have personally reviewed the Medicare Annual Wellness questionnaire and have noted 1. The patient's medical and social history 2. Their use of alcohol, tobacco or illicit drugs 3. Their current medications and supplements 4. The patient's functional ability including ADL's, fall risks, home safety risks and hearing or visual impairment. Cognitive function has been assessed and addressed as indicated.  5. Diet and physical activity 6. Evidence for depression or mood disorders The patients weight, height, BMI have been recorded in the chart. I have made referrals, counseling and provided education to the patient based on review of the above and I have provided the pt with a written personalized care plan for preventive services. Provider list updated.. See scanned questionairre as needed for further documentation. Reviewed preventative protocols and updated unless pt declined.       HYPERCHOLESTEROLEMIA    Chronic, stable. Continue low dose lipitor.      Relevant Medications   triamterene-hydrochlorothiazide (MAXZIDE-25) 37.5-25 MG tablet   atorvastatin (LIPITOR) 10 MG tablet   amLODipine (NORVASC) 10 MG tablet   Healthcare maintenance    Preventative protocols reviewed and updated unless pt declined. Discussed healthy diet and lifestyle.       Estrogen deficiency    Pt slowly tapering off menest      Essential hypertension    BP elevated - recheck next visit.      Relevant Medications    triamterene-hydrochlorothiazide (MAXZIDE-25) 37.5-25 MG tablet   atorvastatin (LIPITOR) 10 MG tablet   amLODipine (NORVASC) 10 MG tablet   Diet-controlled diabetes mellitus (HCC)    A1c now increased to diabetes range. Discussed with patient. Discussed foot care, eye care.  Will refer to DM education. RTC 3 mo DM f/u.  Relevant Medications   atorvastatin (LIPITOR) 10 MG tablet   Other Relevant Orders   Ambulatory referral to diabetic education   COPD, mild ([place])    Trouble affording resp meds. Suggested pt look into available coupons online.      ANXIETY DEPRESSION    Chronic, stable on low dose prozac 10mg  daily - desires to continue.      Relevant Medications   FLUoxetine (PROZAC) 10 MG capsule   Advanced care planning/counseling discussion    Advanced directives - would like packet. Would want son [name]) to be HCPOA. No prolonged life support if terminal condition. Full code, ok with temporary ventilation.       Acute sinusitis    Given duration of illness will treat with amoxicillin course. Continue other treatment as up to now. Pt agrees with plan.      Relevant Medications   amoxicillin (AMOXIL) 875 MG tablet   Abnormal MRI, spine       Follow up plan: No Follow-up on file.

## 2015-09-08 NOTE — Assessment & Plan Note (Signed)
Advanced directives - would like packet. Would want son (Shawn Galloway) to be HCPOA. No prolonged life support if terminal condition. Full code, ok with temporary ventilation.  

## 2015-09-08 NOTE — Assessment & Plan Note (Signed)
BP elevated - recheck next visit.

## 2015-09-14 ENCOUNTER — Encounter: Payer: Self-pay | Admitting: Internal Medicine

## 2015-09-14 ENCOUNTER — Ambulatory Visit (INDEPENDENT_AMBULATORY_CARE_PROVIDER_SITE_OTHER): Payer: PPO | Admitting: Internal Medicine

## 2015-09-14 VITALS — BP 128/64 | HR 77 | Ht 64.0 in | Wt 191.4 lb

## 2015-09-14 DIAGNOSIS — J449 Chronic obstructive pulmonary disease, unspecified: Secondary | ICD-10-CM

## 2015-09-14 MED ORDER — UMECLIDINIUM BROMIDE 62.5 MCG/INH IN AEPB: 1.0000 | INHALATION_SPRAY | Freq: Every day | RESPIRATORY_TRACT | Status: AC

## 2015-09-14 MED ORDER — UMECLIDINIUM BROMIDE 62.5 MCG/INH IN AEPB: 1.0000 | INHALATION_SPRAY | Freq: Every day | RESPIRATORY_TRACT | Status: DC

## 2015-09-14 NOTE — Assessment & Plan Note (Signed)
Given her history of cough, prior smoking history, and current symptoms-patient clinically fits COPD, however based on PFTs today there is no significant obstruction.   Clinically speaking now, she will continue to be treated as mild COPD on a symptomatic basis.  as her last visit she has stopped Advair, and only continue on Spiriva, she's been doing fairly well except for the financial aspect of Spiriva. At today's visit we will switch her to a similar class to Spiriva, which is Incruse.   Plan: -Stop Spiriva, Start Incruse (62.32mcg, 1 inhalation daily).  -Continue to gargle and rinse after each use of bronchodilators -Continue with diet, weight loss, exercise -Avoid secondhand smoke exposure

## 2015-09-14 NOTE — Patient Instructions (Addendum)
Follow up with Dr. Stevenson Clinch in: 6 months - pulmonary function testing and 6 minute walk test prior to follow up - stop Spiriva - start Incruse (62.40mcg)- 1 inhalation daily

## 2015-09-14 NOTE — Progress Notes (Signed)
MRN# [id number] Jennifer Pineda 06-May-1951   CC: Chief Complaint  Patient presents with  . Follow-up    pt. states breathing is baseline. prod. cough white in color X69mo. seen pcp 09/08/15 started amoxicllin feels that it has helped. denies SOB, wheezing or chest pain/tightness.      Brief History: HPI 10/2014 Patient is a pleasant 65 year old female presenting today with above chief complaint. Since November 2015 she was diagnosed with a "sinusitis" she had symptoms of nasal congestion clear to yellow at times throughout the course of her sinusitis she had 3 Z-Paks and 8 course of prednisone which improved the sinusitis nasal congestion however she remained with a persistent cough. Patient states that she can have a coughing spell that will last up to about 30 minutes at times, the cough is productive at times with thick white sputum. Her primary care physician has placed her on Advair, albuterol. Patient states she was given a trial of albuterol scheduled 3 times a day which did help, but cough returned after albuterol stopped. She is a former smoker, smoked for 19 years 2 pack per day quit in 1988. Has a history of asthma, hyperlipidemia, hypertension, obstructive sleep apnea on CPAP (which she states she's using every night.) Patient cannot identify any inciting factors in November besides sinusitis.  Plan - ENT follow, Advair\spiriva\wt loss  ROV 01/2015: States that she is doing well overall. Saw ENT since last visit, was dx with internal nasal cellulitis (given gentamicin 0.1% topical ointment and Bactoban 2% topical ointment), for which she is still taking meds for.  Also, ENT diagnosed her with chronic pharyngitis, which could be related to her COPD inhalers (Advair and Spiriva) Overall doing much better, no significant cough or shortness of breath.  Patient had her PFTs and 6 MWT test done day.   PLAN: Plan: -continue current dose of Advair, then stop as part of step down therapy.  If COPD symptoms returned, will consider starting on Symbicort; patient had symptoms of cough possibly due to Advair. -Continue with Spiriva -Continue to gargle and rinse after each use of bronchodilators -Continue with diet, weight loss, exercise -Avoid secondhand smoke exposure Events since last clinic visit: She presents today for follow-up visit of her COPD. Since her last visit she's had cystoscopy, she also states that she's been having a mild cough that's improving. Initially the cough was productive of yellowish sputum,  Currently it is white. She had seen her primary care physician 1 week ago, and was started on amoxicillin since then her cough is improved.  Today patient states overall she is doing well. Patient states that she Spiriva is very beneficial for her but is also costly. She inquired about alternative medication for her COPD.   Medication:   Current Outpatient Rx  Name  Route  Sig  Dispense  Refill  . albuterol (PROVENTIL HFA;VENTOLIN HFA) 108 (90 BASE) MCG/ACT inhaler   Inhalation   Inhale 2 puffs into the lungs every 6 (six) hours as needed. For shortness of breath.   54 Inhaler   3   . amLODipine (NORVASC) 10 MG tablet      Take one tablet daily   90 tablet   3   . amoxicillin (AMOXIL) 875 MG tablet   Oral   Take 1 tablet (875 mg total) by mouth 2 (two) times daily.   14 tablet   0   . atorvastatin (LIPITOR) 10 MG tablet      Take one tablet daily  90 tablet   3   . FLUoxetine (PROZAC) 10 MG capsule   Oral   Take 1 capsule (10 mg total) by mouth daily.   90 capsule   3   . fluticasone (FLONASE) 50 MCG/ACT nasal spray      USE TWO SPRAY(S) IN EACH NOSTRIL ONCE DAILY   16 g   3   . guaifenesin (HUMIBID E) 400 MG TABS   Oral   Take 400 mg by mouth every morning.          . loratadine (CLARITIN) 10 MG tablet      TAKE ONE TABLET BY MOUTH ONCE DAILY   30 tablet   3   . MENEST 0.3 MG tablet      TAKE ONE TABLET BY MOUTH ONCE  DAILY Patient taking differently: TAKE ONE-HALF TABLET BY MOUTH ONCE DAILY--  takes in am   90 tablet   1   . omeprazole (PRILOSEC) 40 MG capsule      TAKE ONE CAPSULE BY MOUTH IN THE MORNING. TAKE 45 MINUTES PRIOR TO BREAKFAST   90 capsule   3   . Tiotropium Bromide Monohydrate (SPIRIVA RESPIMAT) 2.5 MCG/ACT AERS      Inhale 2 puffs every morning. Rinse/gargle after each use.   4 g   5   . triamterene-hydrochlorothiazide (MAXZIDE-25) 37.5-25 MG tablet      Take one tablet daily   90 tablet   3   . Fluticasone-Salmeterol (ADVAIR) 500-50 MCG/DOSE AEPB   Inhalation   Inhale 1 puff into the lungs 2 (two) times daily. Patient not taking: Reported on 09/14/2015   60 each   3   . Umeclidinium Bromide (INCRUSE ELLIPTA) 62.5 MCG/INH AEPB   Inhalation   Inhale 1 puff into the lungs daily.   30 each   5   . Umeclidinium Bromide (INCRUSE ELLIPTA) 62.5 MCG/INH AEPB   Inhalation   Inhale 1 puff into the lungs daily.   7 each   0      Review of Systems: Gen:  Denies  fever, sweats, chills HEENT: Denies blurred vision, double vision, ear pain, eye pain, hearing loss, nose bleeds, sore throat Cvc:  No dizziness, chest pain or heaviness Resp:    Mild productive cough Gi: Denies swallowing difficulty, stomach pain, nausea or vomiting, diarrhea, constipation, bowel incontinence Gu:  Denies bladder incontinence, burning urine Ext:   No Joint pain, stiffness or swelling Skin: No skin rash, easy bruising or bleeding or hives Endoc:  No polyuria, polydipsia , polyphagia or weight change Other:  All other systems negative  Allergies:  Levofloxacin; Ampicillin; Doxycycline hyclate; Metoprolol succinate; Tetracycline hcl; and Adhesive  Physical Examination:  VS: BP 128/64 mmHg  Pulse 77  Ht 5\' 4"  (1.626 m)  Wt 191 lb 6.4 oz (86.818 kg)  BMI 32.84 kg/m2  SpO2 98%  General Appearance: No distress  HEENT: PERRLA, no ptosis, no other lesions noticed Pulmonary:normal breath  sounds., diaphragmatic excursion normal.No wheezing, No rales   Cardiovascular:  Normal S1,S2.  No m/r/g.     Abdomen:Exam: Benign, Soft, non-tender, No masses  Skin:   warm, no rashes, no ecchymosis  Extremities: normal, no cyanosis, clubbing, warm with normal capillary refill.      PFTs 02/01/2015: FVC 82% FEV1 90% FEV1/FVC 82% RV 116 TLC 102 RV/TLC 112 ERV 28 DLCO uncorrected 85 Impression: Essentially normal PFTs, no significant obstruction or restriction noted. Severe reduction in ERV, abdominal obesity or chest wall obesity can add to this.  6 minute walk test: 333 m/1092 feet, lowest saturation 93%, highest heart rate 79, no complaints after test.      Assessment and Plan:65 year old female seen in followup visit today for cough/COPD.  COPD, mild Given her history of cough, prior smoking history, and current symptoms-patient clinically fits COPD, however based on PFTs today there is no significant obstruction.   Clinically speaking now, she will continue to be treated as mild COPD on a symptomatic basis.  as her last visit she has stopped Advair, and only continue on Spiriva, she's been doing fairly well except for the financial aspect of Spiriva. At today's visit we will switch her to a similar class to Spiriva, which is Incruse.   Plan: -Stop Spiriva, Start Incruse (62.24mcg, 1 inhalation daily).  -Continue to gargle and rinse after each use of bronchodilators -Continue with diet, weight loss, exercise -Avoid secondhand smoke exposure         Updated Medication List Outpatient Encounter Prescriptions as of 09/14/2015  Medication Sig  . albuterol (PROVENTIL HFA;VENTOLIN HFA) 108 (90 BASE) MCG/ACT inhaler Inhale 2 puffs into the lungs every 6 (six) hours as needed. For shortness of breath.  Marland Kitchen amLODipine (NORVASC) 10 MG tablet Take one tablet daily  . amoxicillin (AMOXIL) 875 MG tablet Take 1 tablet (875 mg total) by mouth 2 (two) times daily.  Marland Kitchen atorvastatin  (LIPITOR) 10 MG tablet Take one tablet daily  . FLUoxetine (PROZAC) 10 MG capsule Take 1 capsule (10 mg total) by mouth daily.  . fluticasone (FLONASE) 50 MCG/ACT nasal spray USE TWO SPRAY(S) IN EACH NOSTRIL ONCE DAILY  . guaifenesin (HUMIBID E) 400 MG TABS Take 400 mg by mouth every morning.   . loratadine (CLARITIN) 10 MG tablet TAKE ONE TABLET BY MOUTH ONCE DAILY  . MENEST 0.3 MG tablet TAKE ONE TABLET BY MOUTH ONCE DAILY (Patient taking differently: TAKE ONE-HALF TABLET BY MOUTH ONCE DAILY--  takes in am)  . omeprazole (PRILOSEC) 40 MG capsule TAKE ONE CAPSULE BY MOUTH IN THE MORNING. TAKE 45 MINUTES PRIOR TO BREAKFAST  . Tiotropium Bromide Monohydrate (SPIRIVA RESPIMAT) 2.5 MCG/ACT AERS Inhale 2 puffs every morning. Rinse/gargle after each use.  . triamterene-hydrochlorothiazide (MAXZIDE-25) 37.5-25 MG tablet Take one tablet daily  . Fluticasone-Salmeterol (ADVAIR) 500-50 MCG/DOSE AEPB Inhale 1 puff into the lungs 2 (two) times daily. (Patient not taking: Reported on 09/14/2015)  . Umeclidinium Bromide (INCRUSE ELLIPTA) 62.5 MCG/INH AEPB Inhale 1 puff into the lungs daily.  Marland Kitchen Umeclidinium Bromide (INCRUSE ELLIPTA) 62.5 MCG/INH AEPB Inhale 1 puff into the lungs daily.   No facility-administered encounter medications on file as of 09/14/2015.    Orders for this visit: Orders Placed This Encounter  Procedures  . 6 minute walk    Standing Status: Future     Number of Occurrences:      Standing Expiration Date: 09/13/2016  . Pulmonary function test    Standing Status: Future     Number of Occurrences:      Standing Expiration Date: 09/13/2016    Order Specific Question:  Where should this test be performed?    Answer:  [facility]    Order Specific Question:  6 minute walk    Answer:  Yes    Order Specific Question:  ABG    Answer:  No    Order Specific Question:  Diffusion capacity (DLCO)    Answer:  Yes    Order Specific Question:  Lung volumes    Answer:  Yes    Thank  you  for the visitation and for allowing  [facility] to assist in the care of your patient. Our recommendations are noted above.  Please contact us if we can be of further service.  Vilinda Boehringer, MD [facility] Office Number: [phone]

## 2015-09-15 ENCOUNTER — Other Ambulatory Visit: Payer: Self-pay | Admitting: Family Medicine

## 2015-09-15 DIAGNOSIS — Z1231 Encounter for screening mammogram for malignant neoplasm of breast: Secondary | ICD-10-CM

## 2015-09-19 ENCOUNTER — Ambulatory Visit
Admission: RE | Admit: 2015-09-19 | Discharge: 2015-09-19 | Disposition: A | Discharge status: Home / Self Care | Payer: PPO | Source: Ambulatory Visit | Attending: Family Medicine | Admitting: Family Medicine

## 2015-09-19 DIAGNOSIS — Z1231 Encounter for screening mammogram for malignant neoplasm of breast: Secondary | ICD-10-CM | POA: Diagnosis not present

## 2015-09-19 DIAGNOSIS — C672 Malignant neoplasm of lateral wall of bladder: Secondary | ICD-10-CM | POA: Diagnosis not present

## 2015-09-19 LAB — HM MAMMOGRAPHY: HM Mammogram: NORMAL

## 2015-09-20 ENCOUNTER — Encounter: Payer: Self-pay | Admitting: *Deleted

## 2015-09-25 ENCOUNTER — Encounter: Payer: Self-pay | Admitting: Family Medicine

## 2015-09-28 ENCOUNTER — Ambulatory Visit: Payer: PPO | Admitting: *Deleted

## 2015-10-03 DIAGNOSIS — Z0000 Encounter for general adult medical examination without abnormal findings: Secondary | ICD-10-CM | POA: Diagnosis not present

## 2015-10-03 DIAGNOSIS — Z5111 Encounter for antineoplastic chemotherapy: Secondary | ICD-10-CM | POA: Diagnosis not present

## 2015-10-03 DIAGNOSIS — C672 Malignant neoplasm of lateral wall of bladder: Secondary | ICD-10-CM | POA: Diagnosis not present

## 2015-10-05 ENCOUNTER — Encounter: Payer: Self-pay | Admitting: *Deleted

## 2015-10-05 ENCOUNTER — Encounter: Payer: PPO | Attending: Family Medicine | Admitting: *Deleted

## 2015-10-05 VITALS — BP 168/80 | Ht 63.5 in | Wt 190.1 lb

## 2015-10-05 DIAGNOSIS — E119 Type 2 diabetes mellitus without complications: Secondary | ICD-10-CM

## 2015-10-05 NOTE — Progress Notes (Signed)
Diabetes Self-Management Education  Visit Type: First/Initial  Appt. Start Time: 1330 Appt. End Time: [id number]  10/05/2015  [name], identified by name and date of birth, is a 64 y.o. female with a diagnosis of Diabetes: Type 2.   ASSESSMENT  Blood pressure 168/80, height 5' 3.5" (1.613 m), weight 190 lb 1.6 oz (86.229 kg). Body mass index is 33.14 kg/(m^2).      Diabetes Self-Management Education - 10/05/15 1519    Visit Information   Visit Type First/Initial   Initial Visit   Diabetes Type Type 2   Are you currently following a meal plan? Yes   What type of meal plan do you follow? less sugar and carbs   Are you taking your medications as prescribed? Yes   Date Diagnosed Jan 2017   Health Coping   How would you rate your overall health? Good   Psychosocial Assessment   Patient Belief/Attitude about Diabetes Other (comment)  "angry"   Self-care barriers None   Self-management support Doctor's office;Family   Patient Concerns Nutrition/Meal planning;Medication;Monitoring;Glycemic Control;Weight Control;Problem Solving;Healthy Lifestyle   Special Needs None   Preferred Learning Style Hands on   Learning Readiness Ready   How often do you need to have someone help you when you read instructions, pamphlets, or other written materials from your doctor or pharmacy? 1 - Never   What is the last grade level you completed in school? college   Complications   Last HgB A1C per patient/outside source 6.7 %  09/01/15   How often do you check your blood sugar? 0 times/day (not testing)  Provided One Touch Ultra Mini meter and instructed on use. BG upon return demonstration was 87 mg/dL at 2:35 pm - 5 hrs pp.    Have you had a dilated eye exam in the past 12 months? No  appt scheduled for 10/19/15   Have you had a dental exam in the past 12 months? Yes   Are you checking your feet? No   Dietary Intake   Breakfast banana, oatmeal or bagel with cream cheese or peanut butter    Lunch skips or has a bagel  with ham, cheese or bologna if skipped breakfast   Dinner chicken, spaghetti, pork chops, [place], potatoes, salad   Beverage(s) water, Gatorade, Sprite, coffee with artificial sweetener   Exercise   Exercise Type Light (walking / raking leaves)   How many days per week to you exercise? 7   How many minutes per day do you exercise? 30   Total minutes per week of exercise 210   Patient Education   Previous Diabetes Education No   Disease state  Definition of diabetes, type 1 and 2, and the diagnosis of diabetes   Nutrition management  Role of diet in the treatment of diabetes and the relationship between the three main macronutrients and blood glucose level   Physical activity and exercise  Role of exercise on diabetes management, blood pressure control and cardiac health.   Monitoring Taught/evaluated SMBG meter.;Purpose and frequency of SMBG.;Identified appropriate SMBG and/or A1C goals.   Chronic complications Relationship between chronic complications and blood glucose control   Psychosocial adjustment Identified and addressed patients feelings and concerns about diabetes   Individualized Goals (developed by patient)   Reducing Risk Improve blood sugars Decrease medications Prevent diabetes complications Lose weight Lead a healthier lifestyle Become more fit   Outcomes   Expected Outcomes Demonstrated interest in learning. Expect positive outcomes      Individualized Plan  for Diabetes Self-Management Training:   Learning Objective:  Patient will have a greater understanding of diabetes self-management. Patient education plan is to attend individual and/or group sessions per assessed needs and concerns.   Plan:   Patient Instructions  Check blood sugars 2 x day before breakfast and 2 hrs after supper 3-4 x week Exercise:  Walk  for   30  minutes   7  days a week Avoid sugar sweetened drinks (soda, sports drinks) Eat 3 meals day,  1-2  snacks a  day Space meals 4-6 hours apart Don't skip meals Bring blood sugar records to the next class Call your doctor for a prescription for:  1. Meter strips (type) One Touch Ultra Blue  checking  3  times  per week  2. Lancets (type) One Touch Delica        checking  3  times per week  Expected Outcomes:  Demonstrated interest in learning. Expect positive outcomes  Education material provided:  General Meal Planning Guidelines Simple Meal Plan Meter - One Touch Ultra Mini  If problems or questions, patient to contact team via:   Jennifer Pineda, [place], [place], CDE [phone]  Future DSME appointment:  October 16, 2015 for Class 1

## 2015-10-05 NOTE — Patient Instructions (Addendum)
Check blood sugars 2 x day before breakfast and 2 hrs after supper 3-4 x week Exercise:  Walk  for   30  minutes   7  days a week Avoid sugar sweetened drinks (soda, sports drinks) Eat 3 meals day,  1-2  snacks a day Space meals 4-6 hours apart Don't skip meals Bring blood sugar records to the next class Call your doctor for a prescription for:  1. Meter strips (type) One Touch Ultra Blue  checking  3  times  per week  2. Lancets (type) One Touch Delica        checking  3  times per week

## 2015-10-06 ENCOUNTER — Telehealth: Payer: Self-pay | Admitting: Family Medicine

## 2015-10-06 ENCOUNTER — Other Ambulatory Visit: Payer: Self-pay | Admitting: Family Medicine

## 2015-10-06 MED ORDER — GLUCOSE BLOOD VI STRP: ORAL_STRIP | Status: DC

## 2015-10-06 MED ORDER — ONETOUCH ULTRA SYSTEM W/DEVICE KIT: 1.0000 | PACK | Freq: Once | Status: AC

## 2015-10-06 MED ORDER — ONETOUCH DELICA LANCETS FINE MISC: Status: AC

## 2015-10-06 NOTE — Telephone Encounter (Signed)
request by diabetes education to send in one touch strips and meters. Sent in.

## 2015-10-10 DIAGNOSIS — C672 Malignant neoplasm of lateral wall of bladder: Secondary | ICD-10-CM | POA: Diagnosis not present

## 2015-10-11 ENCOUNTER — Other Ambulatory Visit: Payer: Self-pay | Admitting: *Deleted

## 2015-10-11 MED ORDER — GLUCOSE BLOOD VI STRP: 1.0000 | ORAL_STRIP | Freq: Every day | Status: DC

## 2015-10-16 ENCOUNTER — Encounter: Payer: Self-pay | Admitting: Dietician

## 2015-10-16 ENCOUNTER — Encounter: Payer: PPO | Admitting: Dietician

## 2015-10-16 VITALS — Ht 63.5 in | Wt 188.1 lb

## 2015-10-16 DIAGNOSIS — E119 Type 2 diabetes mellitus without complications: Secondary | ICD-10-CM

## 2015-10-16 NOTE — Progress Notes (Signed)

## 2015-10-17 DIAGNOSIS — C672 Malignant neoplasm of lateral wall of bladder: Secondary | ICD-10-CM | POA: Diagnosis not present

## 2015-10-17 DIAGNOSIS — Z0000 Encounter for general adult medical examination without abnormal findings: Secondary | ICD-10-CM | POA: Diagnosis not present

## 2015-10-17 DIAGNOSIS — Z5111 Encounter for antineoplastic chemotherapy: Secondary | ICD-10-CM | POA: Diagnosis not present

## 2015-10-19 DIAGNOSIS — E119 Type 2 diabetes mellitus without complications: Secondary | ICD-10-CM | POA: Diagnosis not present

## 2015-10-19 LAB — HM DIABETES EYE EXAM

## 2015-10-23 ENCOUNTER — Telehealth: Payer: Self-pay | Admitting: Internal Medicine

## 2015-10-23 ENCOUNTER — Encounter: Payer: Self-pay | Admitting: Dietician

## 2015-10-23 ENCOUNTER — Encounter: Payer: PPO | Admitting: Dietician

## 2015-10-23 VITALS — Wt 186.5 lb

## 2015-10-23 DIAGNOSIS — E119 Type 2 diabetes mellitus without complications: Secondary | ICD-10-CM

## 2015-10-23 NOTE — Telephone Encounter (Signed)
We can try Breo 100/25 - 1 puff daily. Gargle and rinse after each use.

## 2015-10-23 NOTE — Progress Notes (Signed)

## 2015-10-23 NOTE — Telephone Encounter (Signed)
Spoke with the pt  She states that Incruse is not covered by insurance  They also do not cover spiriva  They prefer that she try advair or breo  Please advise on alternative, thanks!

## 2015-10-24 DIAGNOSIS — Z5111 Encounter for antineoplastic chemotherapy: Secondary | ICD-10-CM | POA: Diagnosis not present

## 2015-10-24 DIAGNOSIS — Z0000 Encounter for general adult medical examination without abnormal findings: Secondary | ICD-10-CM | POA: Diagnosis not present

## 2015-10-24 DIAGNOSIS — C672 Malignant neoplasm of lateral wall of bladder: Secondary | ICD-10-CM | POA: Diagnosis not present

## 2015-10-24 MED ORDER — FLUTICASONE FUROATE-VILANTEROL 100-25 MCG/INH IN AEPB: 1.0000 | INHALATION_SPRAY | Freq: Every day | RESPIRATORY_TRACT | Status: DC

## 2015-10-24 NOTE — Telephone Encounter (Signed)
Pt informed. rx sent. Nothing further needed.

## 2015-10-30 ENCOUNTER — Encounter: Payer: PPO | Attending: Family Medicine | Admitting: Dietician

## 2015-10-30 ENCOUNTER — Encounter: Payer: Self-pay | Admitting: Dietician

## 2015-10-30 VITALS — BP 120/70 | Ht 63.5 in | Wt 184.2 lb

## 2015-10-30 DIAGNOSIS — E119 Type 2 diabetes mellitus without complications: Secondary | ICD-10-CM | POA: Insufficient documentation

## 2015-10-30 NOTE — Progress Notes (Signed)

## 2015-10-31 DIAGNOSIS — Z5111 Encounter for antineoplastic chemotherapy: Secondary | ICD-10-CM | POA: Diagnosis not present

## 2015-10-31 DIAGNOSIS — C672 Malignant neoplasm of lateral wall of bladder: Secondary | ICD-10-CM | POA: Diagnosis not present

## 2015-10-31 DIAGNOSIS — Z0000 Encounter for general adult medical examination without abnormal findings: Secondary | ICD-10-CM | POA: Diagnosis not present

## 2015-11-07 ENCOUNTER — Encounter: Payer: Self-pay | Admitting: *Deleted

## 2015-11-07 DIAGNOSIS — Z0000 Encounter for general adult medical examination without abnormal findings: Secondary | ICD-10-CM | POA: Diagnosis not present

## 2015-11-07 DIAGNOSIS — Z5111 Encounter for antineoplastic chemotherapy: Secondary | ICD-10-CM | POA: Diagnosis not present

## 2015-11-07 DIAGNOSIS — C672 Malignant neoplasm of lateral wall of bladder: Secondary | ICD-10-CM | POA: Diagnosis not present

## 2015-11-07 NOTE — Progress Notes (Signed)
Encounter in error

## 2015-11-08 ENCOUNTER — Encounter: Payer: Self-pay | Admitting: Family Medicine

## 2015-12-12 ENCOUNTER — Other Ambulatory Visit: Payer: Self-pay | Admitting: Family Medicine

## 2016-01-04 ENCOUNTER — Ambulatory Visit (INDEPENDENT_AMBULATORY_CARE_PROVIDER_SITE_OTHER): Payer: PPO | Admitting: Family Medicine

## 2016-01-04 ENCOUNTER — Encounter: Payer: Self-pay | Admitting: Family Medicine

## 2016-01-04 VITALS — BP 122/62 | HR 58 | Temp 98.1°F | Wt 182.5 lb

## 2016-01-04 DIAGNOSIS — J40 Bronchitis, not specified as acute or chronic: Secondary | ICD-10-CM

## 2016-01-04 MED ORDER — DOXYCYCLINE HYCLATE 100 MG PO CAPS: 100.0000 mg | ORAL_CAPSULE | Freq: Two times a day (BID) | ORAL | Status: DC

## 2016-01-04 MED ORDER — GUAIFENESIN-CODEINE 100-10 MG/5ML PO SYRP: 5.0000 mL | ORAL_SOLUTION | Freq: Two times a day (BID) | ORAL | Status: DC | PRN

## 2016-01-04 NOTE — Progress Notes (Signed)
Pre visit review using our clinic review tool, if applicable. No additional management support is needed unless otherwise documented below in the visit note. 

## 2016-01-04 NOTE — Progress Notes (Signed)
BP 122/62 mmHg  Pulse 58  Temp(Src) 98.1 F (36.7 C) (Oral)  Wt 182 lb 8 oz (82.781 kg)  SpO2 96%   CC: cough  Subjective:    Patient ID: Jennifer Pineda, female    DOB: [birth year], 65 y.o.   MRN: [id number]  HPI: Jennifer Pineda is a 65 y.o. female presenting on 01/04/2016 for URI   H/o yearly recurrent prolonged cough, saw pulm and allergist - Dr Remus Blake and Dr Stevenson Clinch. Known asthma - on latest pulm eval thought clinical COPD, mild (although PFTs without significant obstruction). Breo started. Had been doing well.   Here with 1 wk h/o cough. Initially Tmax 101 late last week, + RN, drainage, ear pain, watery eyes. Severe harsh productive cough of white/yellow mucous. "sounds worse than it feels". Denies significant wheezing or significant dyspnea - but does notice dyspnea with exertion. No chest pain.   + sick contact at work. Using neti pot.  Recent trip to [place] for bowling.   Regular with her breo ellipta. Taking albuterol inhaler prn - rarely. Takes guaifenesin daily. Takes flonase 2 sprays daily. Takes claritin daily. On omeprazole 76m one pill daily or nexium 251m2 pills daily. Wears CPAP regularly.   [place] papillary L lateral bladder has completed BCG therapy ([place]).   Relevant past medical, surgical, family and social history reviewed and updated as indicated. Interim medical history since our last visit reviewed. Allergies and medications reviewed and updated. Current Outpatient Prescriptions on File Prior to Visit  Medication Sig  . albuterol (PROVENTIL HFA;VENTOLIN HFA) 108 (90 BASE) MCG/ACT inhaler Inhale 2 puffs into the lungs every 6 (six) hours as needed. For shortness of breath.  . Marland KitchenmLODipine (NORVASC) 10 MG tablet Take one tablet daily  . atorvastatin (LIPITOR) 10 MG tablet Take one tablet daily  . Blood Glucose Monitoring Suppl (ONE TOUCH ULTRA SYSTEM KIT) w/Device KIT 1 kit by Does not apply route once.  . Marland KitchenLUoxetine  (PROZAC) 10 MG capsule Take 1 capsule (10 mg total) by mouth daily.  . fluticasone (FLONASE) 50 MCG/ACT nasal spray USE TWO SPRAY(S) IN EACH NOSTRIL ONCE DAILY  . fluticasone furoate-vilanterol (BREO ELLIPTA) 100-25 MCG/INH AEPB Inhale 1 puff into the lungs daily.  . Marland Kitchenlucose blood (ONE TOUCH ULTRA TEST) test strip 1 each by Other route daily. Use to check sugar once daily and as needed. Dx: E11.9  . glucose blood test strip Use as instructed, One Touch Ultra Blue  . guaifenesin (HUMIBID E) 400 MG TABS Take 400 mg by mouth every morning.   . loratadine (CLARITIN) 10 MG tablet TAKE ONE TABLET BY MOUTH ONCE DAILY  . Multiple Vitamins-Calcium (VIACTIV MULTI-VITAMIN) CHEW Chew 1 tablet by mouth daily.  . Marland Kitchenmeprazole (PRILOSEC) 40 MG capsule TAKE ONE CAPSULE BY MOUTH IN THE MORNING. TAKE 45 MINUTES PRIOR TO BREAKFAST  . ONETOUCH DELICA LANCETS FINE MISC Use as directed - check as needed or 3 times weekly  . triamterene-hydrochlorothiazide (MAXZIDE-25) 37.5-25 MG tablet Take one tablet daily   No current facility-administered medications on file prior to visit.    Review of Systems Per HPI unless specifically indicated in ROS section     Objective:    BP 122/62 mmHg  Pulse 58  Temp(Src) 98.1 F (36.7 C) (Oral)  Wt 182 lb 8 oz (82.781 kg)  SpO2 96%  Wt Readings from Last 3 Encounters:  01/04/16 182 lb 8 oz (82.781 kg)  10/30/15 184 lb 3.2 oz (83.553 kg)  10/23/15 186 lb 8 oz (84.596 kg)    Physical Exam  Constitutional: She appears well-developed and well-nourished. No distress.  HENT:  Head: Normocephalic and atraumatic.  Right Ear: Hearing, external ear and ear canal normal.  Left Ear: Hearing, tympanic membrane, external ear and ear canal normal.  Nose: Mucosal edema present. No rhinorrhea. Right sinus exhibits frontal sinus tenderness. Right sinus exhibits no maxillary sinus tenderness. Left sinus exhibits frontal sinus tenderness. Left sinus exhibits no maxillary sinus tenderness.    Mouth/Throat: Uvula is midline and mucous membranes are normal. Posterior oropharyngeal erythema (mild) present. No oropharyngeal exudate, posterior oropharyngeal edema or tonsillar abscesses.  R TM covered by cerumen  Eyes: Conjunctivae and EOM are normal. Pupils are equal, round, and reactive to light. No scleral icterus.  Neck: Normal range of motion. Neck supple.  Cardiovascular: Normal rate, regular rhythm, normal heart sounds and intact distal pulses.   No murmur heard. Pulmonary/Chest: Effort normal. No respiratory distress. She has no wheezes. She has rhonchi (faint RLL). She has no rales.  Mildly coarse Harsh cough present  Lymphadenopathy:    She has no cervical adenopathy.  Skin: Skin is warm and dry. No rash noted.  Nursing note and vitals reviewed.  Results for orders placed or performed in visit on 09/20/15  [facility] MAMMOGRAPHY  Result Value Ref Range   [facility] Mammogram Normal Birads 1-repeat 1 year    Lab Results  Component Value Date   HGBA1C 6.7* 09/01/2015        Assessment & Plan:   Problem List Items Addressed This Visit    Bronchitis - Primary    Anticipate acute bronchitis/mild COPD flare. No wheezing, so will defer prednisone for now. Treat with doxy 1 wk course, cheratussin for cough. Update if not improving with treatment. Pt agrees to re-trial doxy despite documented intolerance (remotely had GI upset to med). Advised to take with food, avoid sun exposure while on abx. Update me if again not tolerating to change abx (zpack vs augmentin).           Follow up plan: Return if symptoms worsen or fail to improve.  Ria Bush, MD

## 2016-01-04 NOTE — Patient Instructions (Addendum)
I think you have bronchitis/mild COPD flare. Treat with doxycycline antibiotic - take with food to help minimize gi upset - if not tolerated, let me know.  Continue other medicines as up to now.  Push fluids and rest.

## 2016-01-04 NOTE — Assessment & Plan Note (Signed)
Anticipate acute bronchitis/mild COPD flare. No wheezing, so will defer prednisone for now. Treat with doxy 1 wk course, cheratussin for cough. Update if not improving with treatment. Pt agrees to re-trial doxy despite documented intolerance (remotely had GI upset to med). Advised to take with food, avoid sun exposure while on abx. Update me if again not tolerating to change abx (zpack vs augmentin).

## 2016-01-09 DIAGNOSIS — M65341 Trigger finger, right ring finger: Secondary | ICD-10-CM | POA: Diagnosis not present

## 2016-01-24 DIAGNOSIS — Z0000 Encounter for general adult medical examination without abnormal findings: Secondary | ICD-10-CM | POA: Diagnosis not present

## 2016-01-24 DIAGNOSIS — C672 Malignant neoplasm of lateral wall of bladder: Secondary | ICD-10-CM | POA: Diagnosis not present

## 2016-01-26 ENCOUNTER — Encounter: Payer: Self-pay | Admitting: Family Medicine

## 2016-03-05 DIAGNOSIS — H6121 Impacted cerumen, right ear: Secondary | ICD-10-CM | POA: Diagnosis not present

## 2016-03-05 DIAGNOSIS — H6061 Unspecified chronic otitis externa, right ear: Secondary | ICD-10-CM | POA: Diagnosis not present

## 2016-04-08 ENCOUNTER — Ambulatory Visit (INDEPENDENT_AMBULATORY_CARE_PROVIDER_SITE_OTHER): Payer: PPO | Admitting: *Deleted

## 2016-04-08 DIAGNOSIS — J449 Chronic obstructive pulmonary disease, unspecified: Secondary | ICD-10-CM | POA: Diagnosis not present

## 2016-04-08 LAB — PULMONARY FUNCTION TEST
DL/VA % pred: 98 %
DL/VA: 4.71 ml/min/mmHg/L
DLCO unc % pred: 84 %
DLCO unc: 20.53 ml/min/mmHg
FEF 25-75 Post: 1.81 L/sec
FEF 25-75 Pre: 1.86 L/sec
FEF2575-%CHANGE-POST: -2 %
FEF2575-%PRED-POST: 88 %
FEF2575-%Pred-Pre: 91 %
FEV1-%CHANGE-POST: 0 %
FEV1-%PRED-POST: 83 %
FEV1-%Pred-Pre: 83 %
FEV1-POST: 1.98 L
FEV1-PRE: 1.98 L
FEV1FVC-%CHANGE-POST: -2 %
FEV1FVC-%Pred-Pre: 100 %
FEV6-%Change-Post: 2 %
FEV6-%Pred-Post: 87 %
FEV6-%Pred-Pre: 86 %
FEV6-PRE: 2.55 L
FEV6-Post: 2.61 L
FEV6FVC-%PRED-PRE: 104 %
FEV6FVC-%Pred-Post: 104 %
FVC-%CHANGE-POST: 2 %
FVC-%PRED-POST: 84 %
FVC-%Pred-Pre: 82 %
FVC-PRE: 2.55 L
FVC-Post: 2.62 L
POST FEV1/FVC RATIO: 75 %
PRE FEV6/FVC RATIO: 100 %
Post FEV6/FVC ratio: 100 %
Pre FEV1/FVC ratio: 78 %
RV % pred: 105 %
RV: 2.25 L
TLC % pred: 97 %
TLC: 4.94 L

## 2016-04-08 NOTE — Progress Notes (Signed)
PFT performed today. 

## 2016-04-08 NOTE — Progress Notes (Signed)
SMW performed today. 

## 2016-04-11 ENCOUNTER — Other Ambulatory Visit: Payer: Self-pay | Admitting: Family Medicine

## 2016-04-19 ENCOUNTER — Ambulatory Visit: Payer: PPO | Admitting: Internal Medicine

## 2016-04-20 IMAGING — MG MM DIGITAL SCREENING BILAT W/ CAD
4 series · 4 of 4 positions shown · non-contrast
Comparison: Previous exam(s).

CLINICAL DATA: Screening.

EXAM:
DIGITAL SCREENING BILATERAL MAMMOGRAM WITH CAD

[L CC]
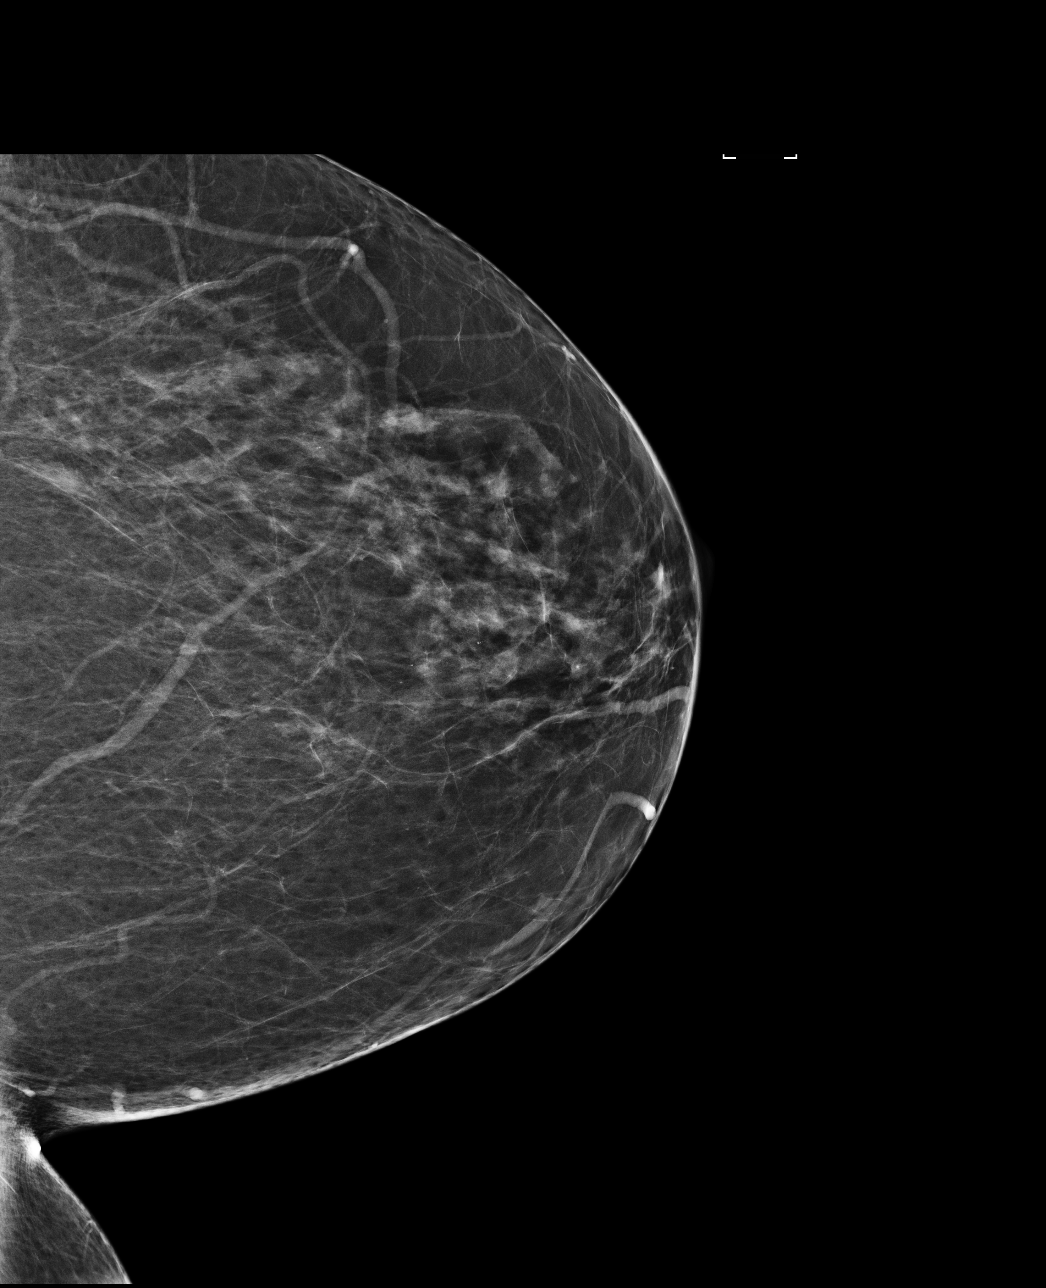

[R MLO]
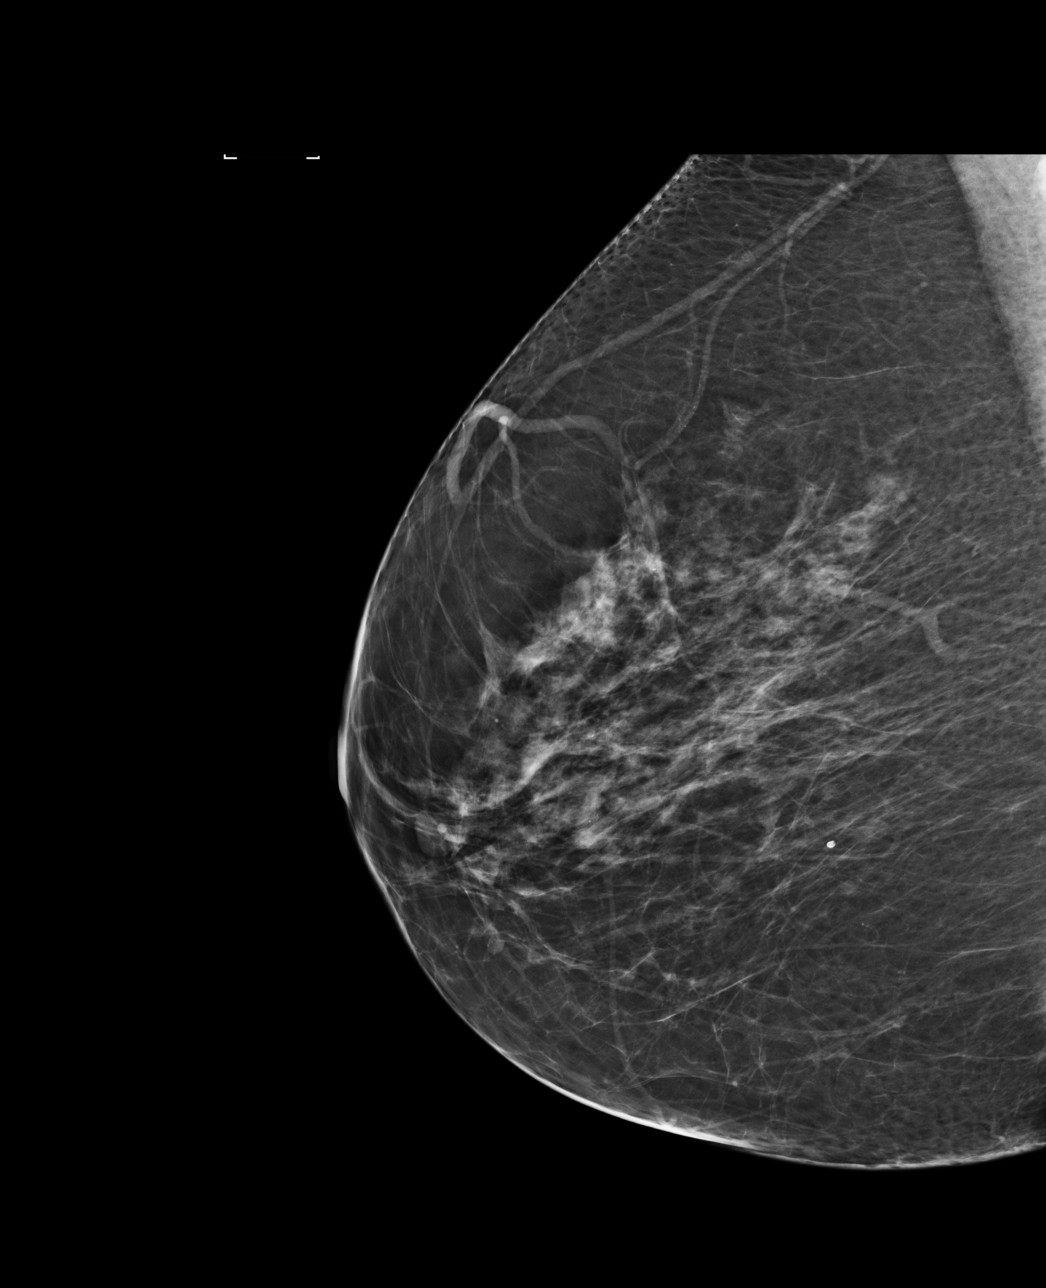

[L MLO]
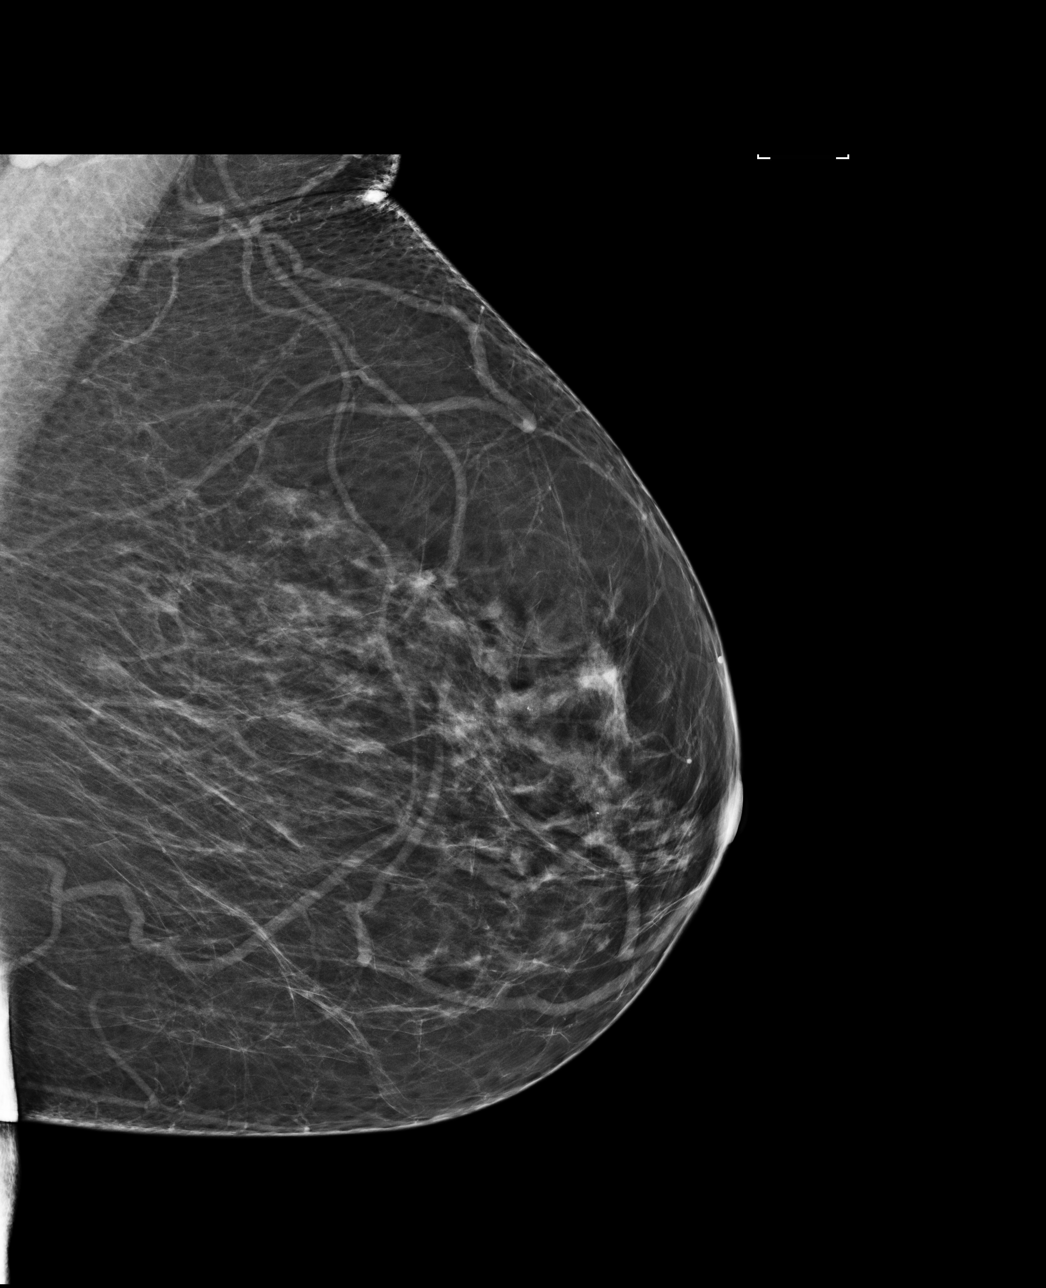

[R CC]
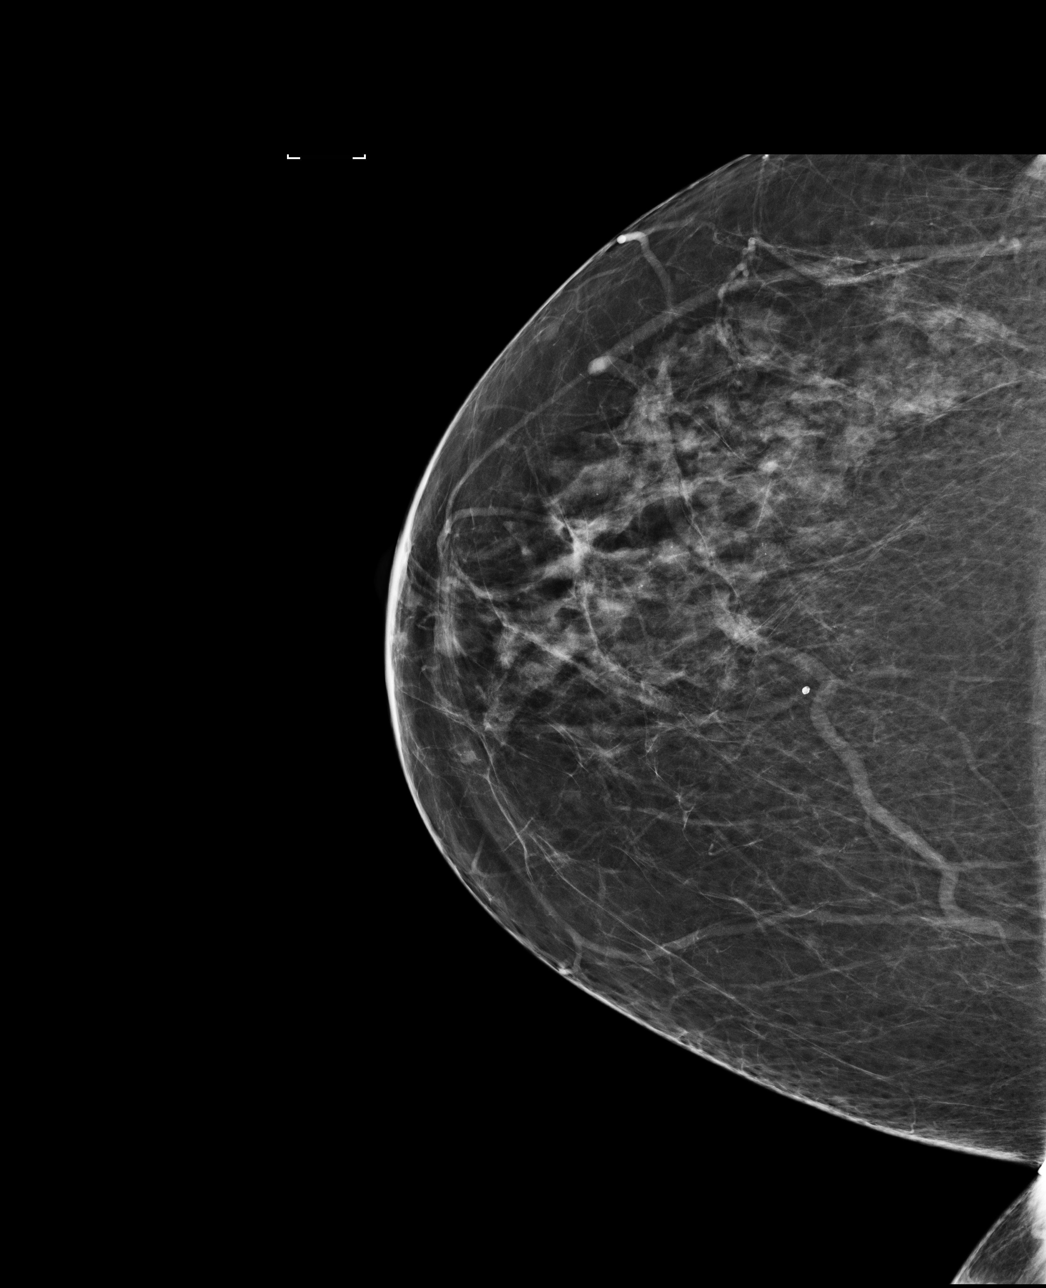

[4 of 4 positions shown; findings below may reference images not displayed]

ACR Breast Density Category b: There are scattered areas of
fibroglandular density.
FINDINGS: There are no findings suspicious for malignancy. Images were
processed with CAD.
IMPRESSION: No mammographic evidence of malignancy. A result letter of this
screening mammogram will be mailed directly to the patient.

RECOMMENDATION:
Screening mammogram in one year. (Code:AS-G-LCT)

BI-RADS CATEGORY  1: Negative.

## 2016-04-23 ENCOUNTER — Encounter: Payer: Self-pay | Admitting: Internal Medicine

## 2016-04-23 ENCOUNTER — Ambulatory Visit (INDEPENDENT_AMBULATORY_CARE_PROVIDER_SITE_OTHER): Payer: PPO | Admitting: Internal Medicine

## 2016-04-23 VITALS — BP 134/62 | HR 60 | Ht 64.0 in | Wt 186.0 lb

## 2016-04-23 DIAGNOSIS — J40 Bronchitis, not specified as acute or chronic: Secondary | ICD-10-CM | POA: Diagnosis not present

## 2016-04-23 DIAGNOSIS — J449 Chronic obstructive pulmonary disease, unspecified: Secondary | ICD-10-CM

## 2016-04-23 NOTE — Patient Instructions (Addendum)
Follow up with Dr. Stevenson Clinch in: 6 months - cont with Breo 100/25 - 1 puff daily  -Continue to gargle and rinse after each use of bronchodilators -Continue with diet, weight loss, exercise -Avoid secondhand smoke exposure

## 2016-04-23 NOTE — Assessment & Plan Note (Signed)
Given her history of cough, prior smoking history, and current symptoms-patient clinically fits COPD, however based on PFTs today there is no significant obstruction.   Clinically speaking now, she will continue to be treated as mild COPD on a symptomatic basis. Currently on Breo with good clinical response, will continue  Plan: - cont with Breo 100/25 - 1 puff daily  -Continue to gargle and rinse after each use of bronchodilators -Continue with diet, weight loss, exercise -Avoid secondhand smoke exposure

## 2016-04-23 NOTE — Progress Notes (Signed)
MRN# [id number] Jennifer Pineda [birth year]   CC: Chief Complaint  Patient presents with  . Follow-up    review 47m and pft- pt denies any breathing complaints at this time.       Brief History: HPI 10/2014 Patient is a pleasant 65year old female presenting today with above chief complaint. Since November 2015 she was diagnosed with a "sinusitis" she had symptoms of nasal congestion clear to yellow at times throughout the course of her sinusitis she had 3 Z-Paks and 8 course of prednisone which improved the sinusitis nasal congestion however she remained with a persistent cough. Patient states that she can have a coughing spell that will last up to about 30 minutes at times, the cough is productive at times with thick white sputum. Her primary care physician has placed her on Advair, albuterol. Patient states she was given a trial of albuterol scheduled 3 times a day which did help, but cough returned after albuterol stopped. She is a former smoker, smoked for 19 years 2 pack per day quit in 1988. Has a history of asthma, hyperlipidemia, hypertension, obstructive sleep apnea on CPAP (which she states she's using every night.) Patient cannot identify any inciting factors in November besides sinusitis.  Plan - ENT follow, Advair\spiriva\wt loss  ROV 01/2015: States that she is doing well overall. Saw ENT since last visit, was dx with internal nasal cellulitis (given gentamicin 0.1% topical ointment and Bactoban 2% topical ointment), for which she is still taking meds for.  Also, ENT diagnosed her with chronic pharyngitis, which could be related to her COPD inhalers (Advair and Spiriva) Overall doing much better, no significant cough or shortness of breath.  Patient had her PFTs and 6 MWT test done day.   PLAN: Plan: -continue current dose of Advair, then stop as part of step down therapy. If COPD symptoms returned, will consider starting on Symbicort; patient had symptoms of cough possibly  due to Advair. -Continue with Spiriva -Continue to gargle and rinse after each use of bronchodilators -Continue with diet, weight loss, exercise -Avoid secondhand smoke exposure Events since last clinic visit: She presents today for follow-up visit of her COPD. Since her last visit she was been doing well. Started on Breo, due to the coverage by insurance.  Currently with no significant complaints, stated that she was loss some weight through diet and exercise.    Medication:    Current Outpatient Prescriptions:  .  albuterol (PROVENTIL HFA;VENTOLIN HFA) 108 (90 BASE) MCG/ACT inhaler, Inhale 2 puffs into the lungs every 6 (six) hours as needed. For shortness of breath., Disp: 54 Inhaler, Rfl: 3 .  amLODipine (NORVASC) 10 MG tablet, Take one tablet daily, Disp: 90 tablet, Rfl: 3 .  atorvastatin (LIPITOR) 10 MG tablet, Take one tablet daily, Disp: 90 tablet, Rfl: 3 .  Blood Glucose Monitoring Suppl (ONE TOUCH ULTRA SYSTEM KIT) w/Device KIT, 1 kit by Does not apply route once., Disp: 1 each, Rfl: 0 .  doxycycline (VIBRAMYCIN) 100 MG capsule, Take 1 capsule (100 mg total) by mouth 2 (two) times daily., Disp: 14 capsule, Rfl: 0 .  FLUoxetine (PROZAC) 10 MG capsule, Take 1 capsule (10 mg total) by mouth daily., Disp: 90 capsule, Rfl: 3 .  fluticasone (FLONASE) 50 MCG/ACT nasal spray, USE TWO SPRAY(S) IN EACH NOSTRIL ONCE DAILY, Disp: 16 g, Rfl: 3 .  fluticasone furoate-vilanterol (BREO ELLIPTA) 100-25 MCG/INH AEPB, Inhale 1 puff into the lungs daily., Disp: 60 each, Rfl: 5 .  glucose blood (ONE TOUCH ULTRA  TEST) test strip, 1 each by Other route daily. Use to check sugar once daily and as needed. Dx: E11.9, Disp: 100 each, Rfl: 3 .  glucose blood test strip, Use as instructed, One Touch Ultra Blue, Disp: 100 each, Rfl: 1 .  guaifenesin (HUMIBID E) 400 MG TABS, Take 400 mg by mouth every morning. , Disp: , Rfl:  .  guaiFENesin-codeine (CHERATUSSIN AC) 100-10 MG/5ML syrup, Take 5 mLs by mouth 2  (two) times daily as needed for cough (sedation precautions)., Disp: 160 mL, Rfl: 0 .  loratadine (CLARITIN) 10 MG tablet, TAKE ONE TABLET BY MOUTH ONCE DAILY, Disp: 30 tablet, Rfl: 11 .  Multiple Vitamins-Calcium (VIACTIV MULTI-VITAMIN) CHEW, Chew 1 tablet by mouth daily., Disp: , Rfl:  .  omeprazole (PRILOSEC) 40 MG capsule, TAKE ONE CAPSULE BY MOUTH IN THE MORNING. TAKE 45 MINUTES PRIOR TO BREAKFAST, Disp: 90 capsule, Rfl: 3 .  ONETOUCH DELICA LANCETS FINE MISC, Use as directed - check as needed or 3 times weekly, Disp: 100 each, Rfl: 1 .  triamterene-hydrochlorothiazide (MAXZIDE-25) 37.5-25 MG tablet, Take one tablet daily, Disp: 90 tablet, Rfl: 3    Review of Systems: Gen:  Denies  fever, sweats, chills HEENT: Denies blurred vision, double vision, ear pain, eye pain, hearing loss, nose bleeds, sore throat Cvc:  No dizziness, chest pain or heaviness Resp:    No cough, no sob.  Gi: Denies swallowing difficulty, stomach pain, nausea or vomiting, diarrhea, constipation, bowel incontinence Gu:  Denies bladder incontinence, burning urine Ext:   No Joint pain, stiffness or swelling Skin: No skin rash, easy bruising or bleeding or hives Endoc:  No polyuria, polydipsia , polyphagia or weight change Other:  All other systems negative  Allergies:  Levofloxacin; Ampicillin; Doxycycline hyclate; Metoprolol succinate; Other; Tetracycline hcl; and Adhesive [tape]  Physical Examination:  VS: BP 134/62 (BP Location: Left Arm, Cuff Size: Normal)   Pulse 60   Ht _0  (1.626 m)   Wt 186 lb (84.4 kg)   SpO2 98%   BMI 31.93 kg/m   General Appearance: No distress  HEENT: PERRLA, no ptosis, no other lesions noticed Pulmonary:normal breath sounds., diaphragmatic excursion normal.No wheezing, No rales   Cardiovascular:  Normal S1,S2.  No m/r/g.     Abdomen:Exam: Benign, Soft, non-tender, No masses  Skin:   warm, no rashes, no ecchymosis  Extremities: normal, no cyanosis, clubbing, warm with normal  capillary refill.      PFTs 02/01/2015: FVC 82% FEV1 90% FEV1/FVC 82% RV 116 TLC 102 RV/TLC 112 ERV 28 DLCO uncorrected 85 Impression: Essentially normal PFTs, no significant obstruction or restriction noted. Severe reduction in ERV, abdominal obesity or chest wall obesity can add to this. 6 minute walk test: 333 m/1092 feet, lowest saturation 93%, highest heart rate 79, no complaints after test.      Assessment and Plan:65 year old female seen in followup visit today for cough/COPD.  COPD, mild ([place]) Given her history of cough, prior smoking history, and current symptoms-patient clinically fits COPD, however based on PFTs today there is no significant obstruction.   Clinically speaking now, she will continue to be treated as mild COPD on a symptomatic basis. Currently on Breo with good clinical response, will continue  Plan: - cont with Breo 100/25 - 1 puff daily  -Continue to gargle and rinse after each use of bronchodilators -Continue with diet, weight loss, exercise -Avoid secondhand smoke exposure        Updated Medication List Outpatient Encounter Prescriptions as of 04/23/2016  Medication Sig  . albuterol (PROVENTIL HFA;VENTOLIN HFA) 108 (90 BASE) MCG/ACT inhaler Inhale 2 puffs into the lungs every 6 (six) hours as needed. For shortness of breath.  Marland Kitchen amLODipine (NORVASC) 10 MG tablet Take one tablet daily  . atorvastatin (LIPITOR) 10 MG tablet Take one tablet daily  . Blood Glucose Monitoring Suppl (ONE TOUCH ULTRA SYSTEM KIT) w/Device KIT 1 kit by Does not apply route once.  . doxycycline (VIBRAMYCIN) 100 MG capsule Take 1 capsule (100 mg total) by mouth 2 (two) times daily.  Marland Kitchen FLUoxetine (PROZAC) 10 MG capsule Take 1 capsule (10 mg total) by mouth daily.  . fluticasone (FLONASE) 50 MCG/ACT nasal spray USE TWO SPRAY(S) IN EACH NOSTRIL ONCE DAILY  . fluticasone furoate-vilanterol (BREO ELLIPTA) 100-25 MCG/INH AEPB Inhale 1 puff into the lungs daily.  Marland Kitchen glucose  blood (ONE TOUCH ULTRA TEST) test strip 1 each by Other route daily. Use to check sugar once daily and as needed. Dx: E11.9  . glucose blood test strip Use as instructed, One Touch Ultra Blue  . guaifenesin (HUMIBID E) 400 MG TABS Take 400 mg by mouth every morning.   Marland Kitchen guaiFENesin-codeine (CHERATUSSIN AC) 100-10 MG/5ML syrup Take 5 mLs by mouth 2 (two) times daily as needed for cough (sedation precautions).  . loratadine (CLARITIN) 10 MG tablet TAKE ONE TABLET BY MOUTH ONCE DAILY  . Multiple Vitamins-Calcium (VIACTIV MULTI-VITAMIN) CHEW Chew 1 tablet by mouth daily.  Marland Kitchen omeprazole (PRILOSEC) 40 MG capsule TAKE ONE CAPSULE BY MOUTH IN THE MORNING. TAKE 45 MINUTES PRIOR TO BREAKFAST  . ONETOUCH DELICA LANCETS FINE MISC Use as directed - check as needed or 3 times weekly  . triamterene-hydrochlorothiazide (MAXZIDE-25) 37.5-25 MG tablet Take one tablet daily   No facility-administered encounter medications on file as of 04/23/2016.     Orders for this visit: No orders of the defined types were placed in this encounter.   Thank  you for the visitation and for allowing  [facility] to assist in the care of your patient. Our recommendations are noted above.  Please contact us if we can be of further service.  Vilinda Boehringer, MD [facility] Office Number: [phone]

## 2016-05-02 DIAGNOSIS — C672 Malignant neoplasm of lateral wall of bladder: Secondary | ICD-10-CM | POA: Diagnosis not present

## 2016-05-04 ENCOUNTER — Encounter: Payer: Self-pay | Admitting: Family Medicine

## 2016-05-07 DIAGNOSIS — C679 Malignant neoplasm of bladder, unspecified: Secondary | ICD-10-CM | POA: Diagnosis not present

## 2016-05-07 DIAGNOSIS — C672 Malignant neoplasm of lateral wall of bladder: Secondary | ICD-10-CM | POA: Diagnosis not present

## 2016-05-14 DIAGNOSIS — Z5111 Encounter for antineoplastic chemotherapy: Secondary | ICD-10-CM | POA: Diagnosis not present

## 2016-05-14 DIAGNOSIS — C672 Malignant neoplasm of lateral wall of bladder: Secondary | ICD-10-CM | POA: Diagnosis not present

## 2016-05-21 DIAGNOSIS — Z5111 Encounter for antineoplastic chemotherapy: Secondary | ICD-10-CM | POA: Diagnosis not present

## 2016-05-21 DIAGNOSIS — C672 Malignant neoplasm of lateral wall of bladder: Secondary | ICD-10-CM | POA: Diagnosis not present

## 2016-05-28 DIAGNOSIS — C672 Malignant neoplasm of lateral wall of bladder: Secondary | ICD-10-CM | POA: Diagnosis not present

## 2016-05-28 DIAGNOSIS — Z5111 Encounter for antineoplastic chemotherapy: Secondary | ICD-10-CM | POA: Diagnosis not present

## 2016-06-05 ENCOUNTER — Ambulatory Visit (INDEPENDENT_AMBULATORY_CARE_PROVIDER_SITE_OTHER): Payer: PPO | Admitting: Family Medicine

## 2016-06-05 ENCOUNTER — Encounter: Payer: Self-pay | Admitting: Family Medicine

## 2016-06-05 VITALS — BP 140/78 | HR 73 | Temp 98.6°F | Resp 20 | Wt 187.0 lb

## 2016-06-05 DIAGNOSIS — C689 Malignant neoplasm of urinary organ, unspecified: Secondary | ICD-10-CM | POA: Diagnosis not present

## 2016-06-05 DIAGNOSIS — B029 Zoster without complications: Secondary | ICD-10-CM

## 2016-06-05 DIAGNOSIS — J449 Chronic obstructive pulmonary disease, unspecified: Secondary | ICD-10-CM | POA: Diagnosis not present

## 2016-06-05 DIAGNOSIS — R05 Cough: Secondary | ICD-10-CM

## 2016-06-05 DIAGNOSIS — R053 Chronic cough: Secondary | ICD-10-CM

## 2016-06-05 MED ORDER — VALACYCLOVIR HCL 1 G PO TABS: 1000.0000 mg | ORAL_TABLET | Freq: Three times a day (TID) | ORAL | 0 refills | Status: DC

## 2016-06-05 MED ORDER — TIOTROPIUM BROMIDE MONOHYDRATE 18 MCG IN CAPS: 18.0000 ug | ORAL_CAPSULE | Freq: Every day | RESPIRATORY_TRACT | 3 refills | Status: DC

## 2016-06-05 NOTE — Progress Notes (Signed)
Pre visit review using our clinic review tool, if applicable. No additional management support is needed unless otherwise documented below in the visit note. 

## 2016-06-05 NOTE — Assessment & Plan Note (Signed)
Recurred. Will add spiriva to breo. Continue albuterol PRN and cheratussin for cough suppression at night. F/u pulm if no better.

## 2016-06-05 NOTE — Patient Instructions (Addendum)
You do likely have shingles. Treat with antiviral. Let us know if not improving.  Restart spiriva, continue breo. Let us know if no improvement with cough. Flu shot when you're feeling better, after you've completed valtrex

## 2016-06-05 NOTE — Assessment & Plan Note (Signed)
Appreciate urology care of patient. Recent BCG instillation.

## 2016-06-05 NOTE — Assessment & Plan Note (Addendum)
R groin region, of 1 wk duration. Given possible new lesions in last 24 hours and concern for spread, will Rx valtrex 7d course although outside of optimal window for treatment. Update if not improving with treatment as expected

## 2016-06-05 NOTE — Progress Notes (Addendum)
BP 140/78   Pulse 73   Temp 98.6 F (37 C) (Oral)   Resp 20   Wt 187 lb (84.8 kg)   SpO2 97%   BMI 32.10 kg/m    CC: ?shingles, cough Subjective:    Patient ID: Jennifer Pineda, female    DOB: [birth year], 65 y.o.   MRN: [id number]  HPI: Jennifer Pineda is a 65 y.o. female presenting on 06/05/2016 for Herpes Zoster and Cough   1 wk h/o tender blistering rash in R groin. No other rash. No new lotions, detergents, soaps or shampoos, no new foods, no new medications. Increased stress recently.   Urothelial carcinoma - received BCG treatment 05/28/2016.   Has established with [facility] for chronic cough with recurrent bronchitis. Stable on breo with prn albuterol. Cough has returned over the past month. She has been treating with codeine cough syrup. No fevers/chills. Dry cough.   Did receive zostavax. Remote h/o genital herpes.   Relevant past medical, surgical, family and social history reviewed and updated as indicated. Interim medical history since our last visit reviewed. Allergies and medications reviewed and updated. Current Outpatient Prescriptions on File Prior to Visit  Medication Sig  . albuterol (PROVENTIL HFA;VENTOLIN HFA) 108 (90 BASE) MCG/ACT inhaler Inhale 2 puffs into the lungs every 6 (six) hours as needed. For shortness of breath.  Marland Kitchen amLODipine (NORVASC) 10 MG tablet Take one tablet daily  . atorvastatin (LIPITOR) 10 MG tablet Take one tablet daily  . Blood Glucose Monitoring Suppl (ONE TOUCH ULTRA SYSTEM KIT) w/Device KIT 1 kit by Does not apply route once.  . doxycycline (VIBRAMYCIN) 100 MG capsule Take 1 capsule (100 mg total) by mouth 2 (two) times daily.  Marland Kitchen FLUoxetine (PROZAC) 10 MG capsule Take 1 capsule (10 mg total) by mouth daily.  . fluticasone (FLONASE) 50 MCG/ACT nasal spray USE TWO SPRAY(S) IN EACH NOSTRIL ONCE DAILY  . fluticasone furoate-vilanterol (BREO ELLIPTA) 100-25 MCG/INH AEPB Inhale 1 puff into the lungs daily.  Marland Kitchen glucose blood (ONE TOUCH  ULTRA TEST) test strip 1 each by Other route daily. Use to check sugar once daily and as needed. Dx: E11.9  . glucose blood test strip Use as instructed, One Touch Ultra Blue  . guaifenesin (HUMIBID E) 400 MG TABS Take 400 mg by mouth every morning.   Marland Kitchen guaiFENesin-codeine (CHERATUSSIN AC) 100-10 MG/5ML syrup Take 5 mLs by mouth 2 (two) times daily as needed for cough (sedation precautions).  . loratadine (CLARITIN) 10 MG tablet TAKE ONE TABLET BY MOUTH ONCE DAILY  . Multiple Vitamins-Calcium (VIACTIV MULTI-VITAMIN) CHEW Chew 1 tablet by mouth daily.  Marland Kitchen omeprazole (PRILOSEC) 40 MG capsule TAKE ONE CAPSULE BY MOUTH IN THE MORNING. TAKE 45 MINUTES PRIOR TO BREAKFAST  . ONETOUCH DELICA LANCETS FINE MISC Use as directed - check as needed or 3 times weekly  . triamterene-hydrochlorothiazide (MAXZIDE-25) 37.5-25 MG tablet Take one tablet daily   No current facility-administered medications on file prior to visit.     Review of Systems Per HPI unless specifically indicated in ROS section     Objective:    BP 140/78   Pulse 73   Temp 98.6 F (37 C) (Oral)   Resp 20   Wt 187 lb (84.8 kg)   SpO2 97%   BMI 32.10 kg/m   Wt Readings from Last 3 Encounters:  06/05/16 187 lb (84.8 kg)  04/23/16 186 lb (84.4 kg)  01/04/16 182 lb 8 oz (82.8 kg)    Physical Exam  Constitutional: She appears well-developed and well-nourished. No distress.  HENT:  Head: Normocephalic and atraumatic.  Mouth/Throat: Oropharynx is clear and moist. No oropharyngeal exudate.  Eyes: Conjunctivae and EOM are normal. Pupils are equal, round, and reactive to light.  Cardiovascular: Normal rate, regular rhythm, normal heart sounds and intact distal pulses.   No murmur heard. Pulmonary/Chest: Effort normal and breath sounds normal. No respiratory distress. She has no wheezes. She has no rales.  Musculoskeletal: She exhibits no edema.  Skin: Skin is warm and dry. Rash noted.  Cluster of vesicles filled with turbid fluid  on erythematous base at R groin region with dried lesions R mons pubis  Psychiatric: She has a normal mood and affect.  Nursing note and vitals reviewed.     Assessment & Plan:   Problem List Items Addressed This Visit    Chronic cough    Recurred. Will add spiriva to breo. Continue albuterol PRN and cheratussin for cough suppression at night. F/u pulm if no better.      COPD, mild ([place])    See above.      Relevant Medications   tiotropium (SPIRIVA HANDIHALER) 18 MCG inhalation capsule   Shingles - Primary    R groin region, of 1 wk duration. Given possible new lesions in last 24 hours and concern for spread, will Rx valtrex 7d course although outside of optimal window for treatment. Update if not improving with treatment as expected      Relevant Medications   valACYclovir (VALTREX) 1000 MG tablet   Urothelial carcinoma [facility])    Appreciate urology care of patient. Recent BCG instillation.      Relevant Medications   valACYclovir (VALTREX) 1000 MG tablet    Other Visit Diagnoses   None.      Follow up plan: Return if symptoms worsen or fail to improve.  Ria Bush, MD

## 2016-06-05 NOTE — Assessment & Plan Note (Signed)
See above

## 2016-06-11 ENCOUNTER — Other Ambulatory Visit: Payer: Self-pay | Admitting: Internal Medicine

## 2016-06-17 ENCOUNTER — Encounter: Payer: Self-pay | Admitting: *Deleted

## 2016-06-18 DIAGNOSIS — M65341 Trigger finger, right ring finger: Secondary | ICD-10-CM | POA: Diagnosis not present

## 2016-07-23 ENCOUNTER — Ambulatory Visit (INDEPENDENT_AMBULATORY_CARE_PROVIDER_SITE_OTHER): Payer: PPO | Admitting: Internal Medicine

## 2016-07-23 ENCOUNTER — Encounter: Payer: Self-pay | Admitting: Internal Medicine

## 2016-07-23 VITALS — BP 132/86 | HR 61 | Temp 98.0°F | Wt 185.0 lb

## 2016-07-23 DIAGNOSIS — J0100 Acute maxillary sinusitis, unspecified: Secondary | ICD-10-CM | POA: Diagnosis not present

## 2016-07-23 MED ORDER — AMOXICILLIN 875 MG PO TABS: 875.0000 mg | ORAL_TABLET | Freq: Two times a day (BID) | ORAL | 0 refills | Status: DC

## 2016-07-23 NOTE — Patient Instructions (Signed)

## 2016-07-23 NOTE — Progress Notes (Signed)
HPI  Pt presents to the clinic today with c/o facial pain and pressure, nasal congestion, scratchy throat and cough. This started 1 month ago. She is blowing green, blood tinged sputum out of her nose. She denies difficulty swallowing. The cough is productive of green/yellow mucous. She has been sneezing as well. She denies fever, chills or body aches. She has tried leftover cough syrup with Codeine, Nasonex, Claritin and a sinus rinse with some relief. She has a history of allergies, asthma and COPD. She takes [facility] as prescribed. She has not had to use her Albuterol inhaler. She has not had sick contacts that she is aware of.  Review of Systems     Past Medical History:  Diagnosis Date  . Allergic rhinitis   . Anxiety and depression   . Arthritis    hands  . Asthma, persistent   . CAD (coronary artery disease)    per CT 06-02-2015  . Complication of anesthesia    slow to wake  . COPD, mild ([place])    pulmologist--  dr Vilinda Boehringer ([facility] in [place])  . Diet-controlled diabetes mellitus ([place]) 05/01/2011   Completed DSME 10/2015   . Diverticulosis of colon   . Essential hypertension   . GERD (gastroesophageal reflux disease)   . History of cellulitis    Nov 2015-- nasal cellulitis--  resolved  . History of endometriosis   . History of hiatal hernia   . Hyperlipidemia   . OSA on CPAP    severe per study 05-09-2010  . PONV (postoperative nausea and vomiting)   . Sleep apnea   . Urothelial carcinoma ([place]) 2016   superficial papillary L lateral bladder wall s/p BCG therapy ([place])    Family History  Problem Relation Age of Onset  . Cancer Mother     kidney and lymphoma CA, neprectomy tumor of lung, non smoker  . Kidney disease Mother     T-cell cancer in kidney  . Aneurysm Father     ? in throat  . Diabetes Father   . Heart disease Father     MI  . Diabetes Sister   . Hyperlipidemia Sister   . Heart disease Brother 76    MI  . Alcohol abuse Brother    smokes again  . Hypertension Brother   . Cancer Brother 73    T-cell renal cancer/ removed  . Hypertension Brother   . Heart disease Maternal Aunt     MI  . Colon cancer Neg Hx   . Esophageal cancer Neg Hx   . Rectal cancer Neg Hx   . Stomach cancer Neg Hx     Social History   Social History  . Marital status: Divorced    Spouse name: N/A  . Number of children: 2  . Years of education: N/A   Occupational History  . [employer]   Social History Main Topics  . Smoking status: Former Smoker    Packs/day: 2.00    Years: 19.00    Types: Cigarettes    Quit date: 08/26/1986  . Smokeless tobacco: Never Used  . Alcohol use No     Comment: 2 mixed drinks every week; not recently due to bladder cancer  . Drug use: No  . Sexual activity: Not on file   Other Topics Concern  . Not on file   Social History Narrative   Divorced   Daughter [name], lives with her  Children: 2 children // one daughter lives with mother/ son lives with father   Activity: 20 min walk daily   Diet: good water, fruits/vegetables daily.        Allergies  Allergen Reactions  . Levofloxacin Shortness Of Breath and Swelling  . Ampicillin Diarrhea and Other (See Comments)     stomach upset, tolerated amoxicillin ok  . Doxycycline Hyclate Diarrhea and Other (See Comments)     stomach upset  . Metoprolol Succinate Diarrhea and Other (See Comments)    stomach upset  . Other Swelling    Horseradish  . Tetracycline Hcl Diarrhea and Other (See Comments)     stomach upset  . Adhesive [Tape] Rash     Constitutional: Denies headache, fatigue, fever or abrupt weight changes.  HEENT:  Positive facial pain, nasal congestion and sore throat. Denies eye redness, ear pain, ringing in the ears, wax buildup, runny nose or bloody nose. Respiratory: Positive cough. Denies difficulty breathing or shortness of breath.  Cardiovascular: Denies chest pain, chest  tightness, palpitations or swelling in the hands or feet.   No other specific complaints in a complete review of systems (except as listed in HPI above).  Objective:   BP 132/86   Pulse 61   Temp 98 F (36.7 C) (Oral)   Wt 185 lb (83.9 kg)   BMI 31.76 kg/m   General: Appears her stated age, well developed, well nourished in NAD. HEENT: Head: normal shape and size, right maxillary sinus tenderness noted; Eyes: sclera white, no icterus, conjunctiva pink; Ears: Tm's gray and intact, normal light reflex, + serous effusion bilaterally; Nose: mucosa boggy and moist, septum midline; Throat/Mouth: + PND. Teeth present, mucosa erythematous and moist, no exudate noted, no lesions or ulcerations noted.  Neck:  No adenopathy noted.  Cardiovascular: Normal rate and rhythm. S1,S2 noted.  No murmur, rubs or gallops noted.  Pulmonary/Chest: Normal effort and positive vesicular breath sounds. No respiratory distress. No wheezes, rales or ronchi noted.       Assessment & Plan:   Acute bacterial sinusitis  Can use a Neti Pot which can be purchased from your local drug store. Continue Claritin and Nasonex eRx for Amoxil BID for 10 days  RTC as needed or if symptoms persist. Webb Silversmith, NP

## 2016-07-31 ENCOUNTER — Other Ambulatory Visit: Payer: Self-pay | Admitting: Family Medicine

## 2016-09-23 DIAGNOSIS — G4733 Obstructive sleep apnea (adult) (pediatric): Secondary | ICD-10-CM | POA: Diagnosis not present

## 2016-09-24 ENCOUNTER — Other Ambulatory Visit: Payer: Self-pay | Admitting: Internal Medicine

## 2016-09-30 ENCOUNTER — Ambulatory Visit (INDEPENDENT_AMBULATORY_CARE_PROVIDER_SITE_OTHER): Payer: PPO

## 2016-09-30 VITALS — BP 136/70 | HR 74 | Temp 98.6°F | Ht 63.5 in | Wt 179.5 lb

## 2016-09-30 DIAGNOSIS — E2839 Other primary ovarian failure: Secondary | ICD-10-CM

## 2016-09-30 DIAGNOSIS — I158 Other secondary hypertension: Secondary | ICD-10-CM | POA: Diagnosis not present

## 2016-09-30 DIAGNOSIS — Z1239 Encounter for other screening for malignant neoplasm of breast: Secondary | ICD-10-CM

## 2016-09-30 DIAGNOSIS — Z1159 Encounter for screening for other viral diseases: Secondary | ICD-10-CM | POA: Diagnosis not present

## 2016-09-30 DIAGNOSIS — Z1231 Encounter for screening mammogram for malignant neoplasm of breast: Secondary | ICD-10-CM

## 2016-09-30 DIAGNOSIS — E7849 Other hyperlipidemia: Secondary | ICD-10-CM

## 2016-09-30 DIAGNOSIS — E119 Type 2 diabetes mellitus without complications: Secondary | ICD-10-CM | POA: Diagnosis not present

## 2016-09-30 DIAGNOSIS — E784 Other hyperlipidemia: Secondary | ICD-10-CM | POA: Diagnosis not present

## 2016-09-30 DIAGNOSIS — Z0000 Encounter for general adult medical examination without abnormal findings: Secondary | ICD-10-CM

## 2016-09-30 LAB — CBC WITH DIFFERENTIAL/PLATELET
BASOS PCT: 0.7 % (ref 0.0–3.0)
Basophils Absolute: 0.1 10*3/uL (ref 0.0–0.1)
Eosinophils Absolute: 0.1 10*3/uL (ref 0.0–0.7)
Eosinophils Relative: 1.1 % (ref 0.0–5.0)
HEMATOCRIT: 42.9 % (ref 36.0–46.0)
Hemoglobin: 14.4 g/dL (ref 12.0–15.0)
LYMPHS ABS: 2.6 10*3/uL (ref 0.7–4.0)
LYMPHS PCT: 21.1 % (ref 12.0–46.0)
MCHC: 33.5 g/dL (ref 30.0–36.0)
MCV: 85.1 fl (ref 78.0–100.0)
Monocytes Absolute: 0.9 10*3/uL (ref 0.1–1.0)
Monocytes Relative: 6.9 % (ref 3.0–12.0)
NEUTROS ABS: 8.8 10*3/uL — AB (ref 1.4–7.7)
Neutrophils Relative %: 70.2 % (ref 43.0–77.0)
PLATELETS: 337 10*3/uL (ref 150.0–400.0)
RBC: 5.04 Mil/uL (ref 3.87–5.11)
RDW: 14.4 % (ref 11.5–15.5)
WBC: 12.5 10*3/uL — ABNORMAL HIGH (ref 4.0–10.5)

## 2016-09-30 LAB — LIPID PANEL
CHOLESTEROL: 214 mg/dL — AB (ref 0–200)
HDL: 68.5 mg/dL (ref >39.00)
LDL CALC: 121 mg/dL — AB (ref 0–99)
NonHDL: 145.3
TRIGLYCERIDES: 121 mg/dL (ref 0.0–149.0)
Total CHOL/HDL Ratio: 3
VLDL: 24.2 mg/dL (ref 0.0–40.0)

## 2016-09-30 LAB — COMPREHENSIVE METABOLIC PANEL
ALBUMIN: 4.8 g/dL (ref 3.5–5.2)
ALT: 26 U/L (ref 0–35)
AST: 23 U/L (ref 0–37)
Alkaline Phosphatase: 110 U/L (ref 39–117)
BILIRUBIN TOTAL: 0.7 mg/dL (ref 0.2–1.2)
BUN: 18 mg/dL (ref 6–23)
CALCIUM: 10.4 mg/dL (ref 8.4–10.5)
CHLORIDE: 97 meq/L (ref 96–112)
CO2: 32 meq/L (ref 19–32)
CREATININE: 0.86 mg/dL (ref 0.40–1.20)
GFR: 69.86 mL/min (ref >60.00)
Glucose, Bld: 102 mg/dL — ABNORMAL HIGH (ref 70–99)
Potassium: 4.6 mEq/L (ref 3.5–5.1)
SODIUM: 137 meq/L (ref 135–145)
Total Protein: 9.2 g/dL — ABNORMAL HIGH (ref 6.0–8.3)

## 2016-09-30 LAB — MICROALBUMIN / CREATININE URINE RATIO
CREATININE, U: 197.7 mg/dL
Microalb Creat Ratio: 2 mg/g (ref 0.0–30.0)
Microalb, Ur: 3.9 mg/dL — ABNORMAL HIGH (ref 0.0–1.9)

## 2016-09-30 LAB — HEMOGLOBIN A1C: Hgb A1c MFr Bld: 5.9 % (ref 4.6–6.5)

## 2016-09-30 NOTE — Patient Instructions (Signed)
Ms. Chesler , Thank you for taking time to come for your Medicare Wellness Visit. I appreciate your ongoing commitment to your health goals. Please review the following plan we discussed and let me know if I can assist you in the future.   These are the goals we discussed: Goals    . Increase physical activity          Starting 09/30/2016, I will continue to exercise at least 30 min 3 days per week.        This is a list of the screening recommended for you and due dates:  Health Maintenance  Topic Date Due  . Flu Shot  11/23/2016*  . Mammogram  09/17/2017*  . Complete foot exam   09/30/2017*  . Eye exam for diabetics  10/18/2016  . Hemoglobin A1C  03/30/2017  . Urine Protein Check  09/30/2017  . Colon Cancer Screening  10/14/2022  . DTaP/Tdap/Td vaccine (2 - Td) 05/11/2024  . Tetanus Vaccine  05/11/2024  . DEXA scan (bone density measurement)  Completed  . Shingles Vaccine  Completed  .  Hepatitis C: One time screening is recommended by Center for Disease Control  (CDC) for  adults born from [birth year] through 1967.   Completed  . Pneumonia vaccines  Completed  *Topic was postponed. The date shown is not the original due date.   Preventive Care for Adults  A healthy lifestyle and preventive care can promote health and wellness. Preventive health guidelines for adults include the following key practices.  . A routine yearly physical is a good way to check with your health care provider about your health and preventive screening. It is a chance to share any concerns and updates on your health and to receive a thorough exam.  . Visit your dentist for a routine exam and preventive care every 6 months. Brush your teeth twice a day and floss once a day. Good oral hygiene prevents tooth decay and gum disease.  . The frequency of eye exams is based on your age, health, family medical history, use  of contact lenses, and other factors. Follow your health care provider's ecommendations for  frequency of eye exams.  . Eat a healthy diet. Foods like vegetables, fruits, whole grains, low-fat dairy products, and lean protein foods contain the nutrients you need without too many calories. Decrease your intake of foods high in solid fats, added sugars, and salt. Eat the right amount of calories for you. Get information about a proper diet from your health care provider, if necessary.  . Regular physical exercise is one of the most important things you can do for your health. Most adults should get at least 150 minutes of moderate-intensity exercise (any activity that increases your heart rate and causes you to sweat) each week. In addition, most adults need muscle-strengthening exercises on 2 or more days a week.  Silver Sneakers may be a benefit available to you. To determine eligibility, you may visit the website: www.silversneakers.com or contact program at (412) 561-0420 Mon-Fri between 8AM-8PM.   . Maintain a healthy weight. The body mass index (BMI) is a screening tool to identify possible weight problems. It provides an estimate of body fat based on height and weight. Your health care provider can find your BMI and can help you achieve or maintain a healthy weight.   For adults 20 years and older: ? A BMI below 18.5 is considered underweight. ? A BMI of 18.5 to 24.9 is normal. ? A BMI of  25 to 29.9 is considered overweight. ? A BMI of 30 and above is considered obese.   . Maintain normal blood lipids and cholesterol levels by exercising and minimizing your intake of saturated fat. Eat a balanced diet with plenty of fruit and vegetables. Blood tests for lipids and cholesterol should begin at age 28 and be repeated every 5 years. If your lipid or cholesterol levels are high, you are over 50, or you are at high risk for heart disease, you may need your cholesterol levels checked more frequently. Ongoing high lipid and cholesterol levels should be treated with medicines if diet and  exercise are not working.  . If you smoke, find out from your health care provider how to quit. If you do not use tobacco, please do not start.  . If you choose to drink alcohol, please do not consume more than 2 drinks per day. One drink is considered to be 12 ounces (355 mL) of beer, 5 ounces (148 mL) of wine, or 1.5 ounces (44 mL) of liquor.  . If you are 10-67 years old, ask your health care provider if you should take aspirin to prevent strokes.  . Use sunscreen. Apply sunscreen liberally and repeatedly throughout the day. You should seek shade when your shadow is shorter than you. Protect yourself by wearing long sleeves, pants, a wide-brimmed hat, and sunglasses year round, whenever you are outdoors.  . Once a month, do a whole body skin exam, using a mirror to look at the skin on your back. Tell your health care provider of new moles, moles that have irregular borders, moles that are larger than a pencil eraser, or moles that have changed in shape or color.

## 2016-09-30 NOTE — Progress Notes (Signed)
Subjective:   Jennifer Pineda is a 66 y.o. female who presents for Medicare Annual (Subsequent) preventive examination.  Review of Systems:  N/A Cardiac Risk Factors include: advanced age (>58mn, >>73women);diabetes mellitus;dyslipidemia;hypertension;obesity (BMI >30kg/m2)     Objective:     Vitals: BP 136/70 (BP Location: Left Arm, Patient Position: Sitting, Cuff Size: Normal)   Pulse 74   Temp 98.6 F (37 C) (Oral)   Ht 5' 3.5" (1.613 m) Comment: no shoes  Wt 179 lb 8 oz (81.4 kg)   SpO2 97%   BMI 31.30 kg/m   Body mass index is 31.3 kg/m.   Tobacco History  Smoking Status  . Former Smoker  . Packs/day: 2.00  . Years: 19.00  . Types: Cigarettes  . Quit date: 08/26/1986  Smokeless Tobacco  . Never Used     Counseling given: No   Past Medical History:  Diagnosis Date  . Allergic rhinitis   . Anxiety and depression   . Arthritis    hands  . Asthma, persistent   . CAD (coronary artery disease)    per CT 06-02-2015  . Complication of anesthesia    slow to wake  . COPD, [place] ([place]    pulmologist--  dr vVilinda Boehringer([facility] [place]  . Diet[place]controlled diabetes mellitus [place] 05/01/2011   Completed DSME 10/2015   . Diverticulosis of colon   . Essential hypertension   . GERD (gastroesophageal reflux disease)   . History of cellulitis    Nov 2015-- nasal cellulitis--  resolved  . History of endometriosis   . History of hiatal hernia   . Hyperlipidemia   . OSA on CPAP    severe per study 05-09-2010  . PONV (postoperative nausea and vomiting)   . Sleep apnea   . [place]ial [place] [place]   superficial papillary L lateral bladder wall s/p BCG therapy (Jun[facility] Silk   Past Surgical History:  Procedure Laterality Date  . ABDOMINAL HYSTERECTOMY  1983   and Appendectomy  . CARDIOVASCULAR STRESS TEST  10-06-1998   no evidence ischemia or scar/  normal LV function and wall motion, ef 65%  . COLONOSCOPY  last one 02/ 2014  . CYSTOSCOPY WITH  BIOPSY N/A 08/15/2015   Procedure: CYSTOSCOPY WITH BLADDER RESECTION;  Surgeon: MFestus Aloe MD;  [facility]  Service: Urology;  Laterality: N/A;  . ESOPHAGOGASTRODUODENOSCOPY  last one 08/ 2001  . LUMBAR LAMINECTOMY/DECOMPRESSION MICRODISCECTOMY  10/ 2013  . SEPTOPLASTY  03/  2002  . TRANSURETHRAL RESECTION OF BLADDER TUMOR N/A 07/11/2015   Procedure: TRANSURETHRAL RESECTION OF BLADDER TUMOR (TURBT);  Surgeon: MFestus Aloe MD;  [facility]  Service: Urology;  Laterality: N/A;  . TRANSURETHRAL RESECTION OF BLADDER TUMOR N/A 08/15/2015   Procedure: TRANSURETHRAL RESECTION OF BLADDER TUMOR (TURBT);  Surgeon: MFestus Aloe MD;  [facility]  Service: Urology;  Laterality: N/A;  . TRIGGER FINGER RELEASE Right 2014   Family History  Problem Relation Age of Onset  . Cancer Mother     kidney and lymphoma CA, neprectomy tumor of lung, non smoker  . Kidney disease Mother     T-cell cancer in kidney  . Aneurysm Father     ? in throat  . Diabetes Father   . Heart disease Father     MI  . Diabetes Sister   . Hyperlipidemia Sister   . Heart disease Brother 546   MI  . Alcohol abuse Brother  smokes again  . Hypertension Brother   . Cancer Brother 38    T-cell renal cancer/ removed  . Hypertension Brother   . Heart disease Maternal Aunt     MI  . Colon cancer Neg Hx   . Esophageal cancer Neg Hx   . Rectal cancer Neg Hx   . Stomach cancer Neg Hx    History  Sexual Activity  . Sexual activity: No    Outpatient Encounter Prescriptions as of 09/30/2016  Medication Sig  . albuterol (PROVENTIL HFA;VENTOLIN HFA) 108 (90 BASE) MCG/ACT inhaler Inhale 2 puffs into the lungs every 6 (six) hours as needed. For shortness of breath.  Marland Kitchen amLODipine (NORVASC) 10 MG tablet TAKE ONE TABLET BY MOUTH ONCE DAILY  . atorvastatin (LIPITOR) 10 MG tablet Take one tablet daily  . Blood Glucose Monitoring Suppl (ONE  TOUCH ULTRA SYSTEM KIT) w/Device KIT 1 kit by Does not apply route once.  Marland Kitchen BREO ELLIPTA 100-25 MCG/INH AEPB INHALE ONE PUFF BY MOUTH ONCE DAILY  . FLUoxetine (PROZAC) 10 MG capsule Take 1 capsule (10 mg total) by mouth daily.  . fluticasone (FLONASE) 50 MCG/ACT nasal spray USE TWO SPRAY(S) IN EACH NOSTRIL ONCE DAILY  . fluticasone furoate-vilanterol (BREO ELLIPTA) 100-25 MCG/INH AEPB Inhale 1 puff into the lungs daily.  Marland Kitchen glucose blood (ONE TOUCH ULTRA TEST) test strip 1 each by Other route daily. Use to check sugar once daily and as needed. Dx: E11.9  . glucose blood test strip Use as instructed, One Touch Ultra Blue  . guaifenesin (HUMIBID E) 400 MG TABS Take 400 mg by mouth every morning.   . loratadine (CLARITIN) 10 MG tablet TAKE ONE TABLET BY MOUTH ONCE DAILY  . Multiple Vitamins-Calcium (VIACTIV MULTI-VITAMIN) CHEW Chew 1 tablet by mouth daily.  Marland Kitchen omeprazole (PRILOSEC) 40 MG capsule TAKE ONE CAPSULE BY MOUTH IN THE MORNING. TAKE 45 MINUTES PRIOR TO BREAKFAST  . ONETOUCH DELICA LANCETS FINE MISC Use as directed - check as needed or 3 times weekly  . tiotropium (SPIRIVA HANDIHALER) 18 MCG inhalation capsule Place 1 capsule (18 mcg total) into inhaler and inhale at bedtime.  . triamterene-hydrochlorothiazide (MAXZIDE-25) 37.5-25 MG tablet Take one tablet daily  . [DISCONTINUED] amoxicillin (AMOXIL) 875 MG tablet Take 1 tablet (875 mg total) by mouth 2 (two) times daily.  . [DISCONTINUED] valACYclovir (VALTREX) 1000 MG tablet Take 1 tablet (1,000 mg total) by mouth 3 (three) times daily.   No facility-administered encounter medications on file as of 09/30/2016.     Activities of Daily Living In your present state of health, do you have any difficulty performing the following activities: 09/30/2016  Hearing? N  Vision? N  Difficulty concentrating or making decisions? N  Walking or climbing stairs? N  Dressing or bathing? N  Doing errands, shopping? N  Preparing Food and eating ? N  Using  the Toilet? N  In the past six months, have you accidently leaked urine? Y  Do you have problems with loss of bowel control? N  Managing your Medications? N  Managing your Finances? N  Housekeeping or managing your Housekeeping? N  Some recent data might be hidden    Patient Care Team: Ria Bush, MD as PCP - General (Family Medicine) Arelia Sneddon, OD as Consulting Physician (Optometry) Festus Aloe, MD as Consulting Physician (Urology) Beverly Gust, MD as Consulting Physician (Otolaryngology) Vilinda Boehringer, MD as Consulting Physician (Internal Medicine) Jerrye Beavers, PA-C as Physician Assistant (Physician Assistant)    Assessment:  Hearing Screening   _0  _1  _2  _3  _4  _5  _6  _7  _8   Right ear:   40 40 40  40    Left ear:   40 40 40  40    Vision Screening Comments: Last vision exam in Feb 2017 @ [facility]   Exercise Activities and Dietary recommendations Current Exercise Habits: Home exercise routine, Type of exercise: Other - see comments (low impact exercises), Time (Minutes): 30, Frequency (Times/Week): 3, Weekly Exercise (Minutes/Week): 90, Intensity: Mild, Exercise limited by: None identified  Goals    . Increase physical activity          Starting 09/30/2016, I will continue to exercise at least 30 min 3 days per week.       Fall Risk Fall Risk  09/30/2016 10/30/2015 10/23/2015 10/16/2015 10/05/2015  Falls in the past year? No No (No Data) No No   Depression Screen PHQ 2/9 Scores 09/30/2016 10/05/2015 09/08/2015 05/11/2014  PHQ - 2 Score 0 0 0 0     Cognitive Function MMSE - Mini Mental State Exam 09/30/2016  Orientation to time 5  Orientation to Place 5  Registration 3  Attention/ Calculation 0  Recall 3  Language- name 2 objects 0  Language- repeat 1  Language- follow 3 step command 3  Language- read & follow direction 0  Write a sentence 0  Copy design 0  Total score 20       PLEASE NOTE: A Mini-Cog  screen was completed. Maximum score is 20. A value of 0 denotes this part of Folstein MMSE was not completed or the patient failed this part of the Mini-Cog screening.   Mini-Cog Screening Orientation to Time - Max 5 pts Orientation to Place - Max 5 pts Registration - Max 3 pts Recall - Max 3 pts Language Repeat - Max 1 pts Language Follow 3 Step Command - Max 3 pts   Immunization History  Administered Date(s) Administered  . Influenza,inj,Quad PF,36+ Mos 09/08/2015  . Pneumococcal Conjugate-13 05/11/2014  . Pneumococcal Polysaccharide-23 09/08/2015  . Td 02/11/2004  . Tdap 05/11/2014  . Zoster 06/08/2014   Screening Tests Health Maintenance  Topic Date Due  . INFLUENZA VACCINE  11/23/2016 (Originally 03/26/2016)  . MAMMOGRAM  09/17/2017 (Originally 09/18/2016)  . FOOT EXAM  09/30/2017 (Originally 04/19/1961)  . OPHTHALMOLOGY EXAM  10/18/2016  . HEMOGLOBIN A1C  03/30/2017  . URINE MICROALBUMIN  09/30/2017  . COLONOSCOPY  10/14/2022  . DTaP/Tdap/Td (2 - Td) 05/11/2024  . TETANUS/TDAP  05/11/2024  . DEXA SCAN  Completed  . ZOSTAVAX  Completed  . Hepatitis C Screening  Completed  . PNA vac Low Risk Adult  Completed      Plan:     I have personally reviewed and addressed the Medicare Annual Wellness questionnaire and have noted the following in the patient's chart:  A. Medical and social history B. Use of alcohol, tobacco or illicit drugs  C. Current medications and supplements D. Functional ability and status E.  Nutritional status F.  Physical activity G. Advance directives H. List of other physicians I.  Hospitalizations, surgeries, and ER visits in previous 12 months J.  [place] to include hearing, vision, cognitive, depression L. Referrals and appointments - none  In addition, I have reviewed and discussed with patient certain preventive protocols, quality metrics, and best practice recommendations. A written personalized care plan for preventive  services as well as general preventive health recommendations were provided to patient.  See attached scanned questionnaire  for additional information.   Signed,   Lindell Noe, MHA, BS, LPN Health Coach

## 2016-09-30 NOTE — Progress Notes (Signed)
Pre visit review using our clinic review tool, if applicable. No additional management support is needed unless otherwise documented below in the visit note. 

## 2016-09-30 NOTE — Progress Notes (Signed)
PCP notes:   Health maintenance:  Hep C screening - completed A1C - completed Microalbumin - completed Flu vaccine - addressed; postponed because pt is having some sinus concerns Foot exam - PCP will complete at next appt Mammogram - addressed; ordered Bone density - addressed; ordered  Abnormal screenings:   None  Patient concerns:   None  Nurse concerns:  None  Next PCP appt:   10/07/16 @ 1530

## 2016-10-01 LAB — HEPATITIS C ANTIBODY: HCV Ab: NEGATIVE

## 2016-10-01 NOTE — Progress Notes (Signed)
I reviewed health advisor's note, was available for consultation, and agree with documentation and plan.  

## 2016-10-02 DIAGNOSIS — C672 Malignant neoplasm of lateral wall of bladder: Secondary | ICD-10-CM | POA: Diagnosis not present

## 2016-10-03 ENCOUNTER — Telehealth: Payer: Self-pay | Admitting: *Deleted

## 2016-10-03 MED ORDER — VALACYCLOVIR HCL 1 G PO TABS
2000.0000 mg | ORAL_TABLET | Freq: Two times a day (BID) | ORAL | 1 refills | Status: DC
Start: 2016-10-03 — End: 2016-10-24

## 2016-10-03 NOTE — Telephone Encounter (Signed)
Pt would like a refill on Valacyclovir for fever blisters on lips. Please advise.

## 2016-10-03 NOTE — Addendum Note (Signed)
Addended by: Ria Bush on: 10/03/2016 04:01 PM   Modules accepted: Orders

## 2016-10-03 NOTE — Telephone Encounter (Signed)
plz notify this was sent in.  2 tablets twice daily x 1 day each course (4 tablets one day total)

## 2016-10-03 NOTE — Telephone Encounter (Signed)
Patient notified

## 2016-10-03 NOTE — Telephone Encounter (Signed)
Spoke to pt, need date of birth so I can find chart. Pt said 2051/03/11. Told her received message that she wanted a refill on Valacyclovir for fever blisters on lips. Told her will send message to provider and someone will get back to her. Pt verbalized understanding.

## 2016-10-07 ENCOUNTER — Encounter: Payer: Self-pay | Admitting: Family Medicine

## 2016-10-07 ENCOUNTER — Ambulatory Visit (INDEPENDENT_AMBULATORY_CARE_PROVIDER_SITE_OTHER): Payer: PPO | Admitting: Family Medicine

## 2016-10-07 VITALS — BP 144/70 | HR 72 | Temp 98.6°F | Wt 184.5 lb

## 2016-10-07 DIAGNOSIS — C689 Malignant neoplasm of urinary organ, unspecified: Secondary | ICD-10-CM

## 2016-10-07 DIAGNOSIS — Z0000 Encounter for general adult medical examination without abnormal findings: Secondary | ICD-10-CM | POA: Diagnosis not present

## 2016-10-07 DIAGNOSIS — R053 Chronic cough: Secondary | ICD-10-CM

## 2016-10-07 DIAGNOSIS — I10 Essential (primary) hypertension: Secondary | ICD-10-CM

## 2016-10-07 DIAGNOSIS — R05 Cough: Secondary | ICD-10-CM | POA: Diagnosis not present

## 2016-10-07 DIAGNOSIS — R7303 Prediabetes: Secondary | ICD-10-CM | POA: Diagnosis not present

## 2016-10-07 DIAGNOSIS — J449 Chronic obstructive pulmonary disease, unspecified: Secondary | ICD-10-CM

## 2016-10-07 DIAGNOSIS — Z23 Encounter for immunization: Secondary | ICD-10-CM | POA: Diagnosis not present

## 2016-10-07 DIAGNOSIS — Z7189 Other specified counseling: Secondary | ICD-10-CM | POA: Diagnosis not present

## 2016-10-07 DIAGNOSIS — E7800 Pure hypercholesterolemia, unspecified: Secondary | ICD-10-CM | POA: Diagnosis not present

## 2016-10-07 NOTE — Assessment & Plan Note (Signed)
Marked improvement with healthy diet changes - now in prediabetic range. Will change.

## 2016-10-07 NOTE — Patient Instructions (Addendum)
Flu shot today Call [facility] at [phone] to schedule mammogram.  You are doing well today.  Congratulations on great improvement in sugar control! Return in 1 month lab visit only to recheck protein levels in blood.   Health Maintenance, Female Introduction Adopting a healthy lifestyle and getting preventive care can go a long way to promote health and wellness. Talk with your health care provider about what schedule of regular examinations is right for you. This is a good chance for you to check in with your provider about disease prevention and staying healthy. In between checkups, there are plenty of things you can do on your own. Experts have done a lot of research about which lifestyle changes and preventive measures are most likely to keep you healthy. Ask your health care provider for more information. Weight and diet Eat a healthy diet  Be sure to include plenty of vegetables, fruits, low-fat dairy products, and lean protein.  Do not eat a lot of foods high in solid fats, added sugars, or salt.  Get regular exercise. This is one of the most important things you can do for your health.  Most adults should exercise for at least 150 minutes each week. The exercise should increase your heart rate and make you sweat (moderate-intensity exercise).  Most adults should also do strengthening exercises at least twice a week. This is in addition to the moderate-intensity exercise. Maintain a healthy weight  Body mass index (BMI) is a measurement that can be used to identify possible weight problems. It estimates body fat based on height and weight. Your health care provider can help determine your BMI and help you achieve or maintain a healthy weight.  For females 11 years of age and older:  A BMI below 18.5 is considered underweight.  A BMI of 18.5 to 24.9 is normal.  A BMI of 25 to 29.9 is considered overweight.  A BMI of 30 and above is considered obese. Watch  levels of cholesterol and blood lipids  You should start having your blood tested for lipids and cholesterol at 68 years of age, then have this test every 5 years.  You may need to have your cholesterol levels checked more often if:  Your lipid or cholesterol levels are high.  You are older than 68 years of age.  You are at high risk for heart disease. Cancer screening Lung Cancer  Lung cancer screening is recommended for adults 85-64 years old who are at high risk for lung cancer because of a history of smoking.  A yearly low-dose CT scan of the lungs is recommended for people who:  Currently smoke.  Have quit within the past 15 years.  Have at least a 30-pack-year history of smoking. A pack year is smoking an average of one pack of cigarettes a day for 1 year.  Yearly screening should continue until it has been 15 years since you quit.  Yearly screening should stop if you develop a health problem that would prevent you from having lung cancer treatment. Breast Cancer  Practice breast self-awareness. This means understanding how your breasts normally appear and feel.  It also means doing regular breast self-exams. Let your health care provider know about any changes, no matter how small.  If you are in your 20s or 30s, you should have a clinical breast exam (CBE) by a health care provider every 1-3 years as part of a regular health exam.  If you are 40 or older, have  a CBE every year. Also consider having a breast X-ray (mammogram) every year.  If you have a family history of breast cancer, talk to your health care provider about genetic screening.  If you are at high risk for breast cancer, talk to your health care provider about having an MRI and a mammogram every year.  Breast cancer gene (BRCA) assessment is recommended for women who have family members with BRCA-related cancers. BRCA-related cancers include:  Breast.  Ovarian.  Tubal.  Peritoneal  cancers.  Results of the assessment will determine the need for genetic counseling and BRCA1 and BRCA2 testing. Cervical Cancer  Your health care provider may recommend that you be screened regularly for cancer of the pelvic organs (ovaries, uterus, and vagina). This screening involves a pelvic examination, including checking for microscopic changes to the surface of your cervix (Pap test). You may be encouraged to have this screening done every 3 years, beginning at age 45.  For women ages 48-65, health care providers may recommend pelvic exams and Pap testing every 3 years, or they may recommend the Pap and pelvic exam, combined with testing for human papilloma virus (HPV), every 5 years. Some types of HPV increase your risk of cervical cancer. Testing for HPV may also be done on women of any age with unclear Pap test results.  Other health care providers may not recommend any screening for nonpregnant women who are considered low risk for pelvic cancer and who do not have symptoms. Ask your health care provider if a screening pelvic exam is right for you.  If you have had past treatment for cervical cancer or a condition that could lead to cancer, you need Pap tests and screening for cancer for at least 20 years after your treatment. If Pap tests have been discontinued, your risk factors (such as having a new sexual partner) need to be reassessed to determine if screening should resume. Some women have medical problems that increase the chance of getting cervical cancer. In these cases, your health care provider may recommend more frequent screening and Pap tests. Colorectal Cancer  This type of cancer can be detected and often prevented.  Routine colorectal cancer screening usually begins at 68 years of age and continues through 68 years of age.  Your health care provider may recommend screening at an earlier age if you have risk factors for colon cancer.  Your health care provider may also  recommend using home test kits to check for hidden blood in the stool.  A small camera at the end of a tube can be used to examine your colon directly (sigmoidoscopy or colonoscopy). This is done to check for the earliest forms of colorectal cancer.  Routine screening usually begins at age 33.  Direct examination of the colon should be repeated every 5-10 years through 68 years of age. However, you may need to be screened more often if early forms of precancerous polyps or small growths are found. Skin Cancer  Check your skin from head to toe regularly.  Tell your health care provider about any new moles or changes in moles, especially if there is a change in a mole's shape or color.  Also tell your health care provider if you have a mole that is larger than the size of a pencil eraser.  Always use sunscreen. Apply sunscreen liberally and repeatedly throughout the day.  Protect yourself by wearing long sleeves, pants, a wide-brimmed hat, and sunglasses whenever you are outside. Heart disease, diabetes, and  high blood pressure  High blood pressure causes heart disease and increases the risk of stroke. High blood pressure is more likely to develop in:  People who have blood pressure in the high end of the normal range (130-139/85-89 mm Hg).  People who are overweight or obese.  People who are African American.  If you are 16-23 years of age, have your blood pressure checked every 3-5 years. If you are 25 years of age or older, have your blood pressure checked every year. You should have your blood pressure measured twice-once when you are at a hospital or clinic, and once when you are not at a hospital or clinic. Record the average of the two measurements. To check your blood pressure when you are not at a hospital or clinic, you can use:  An automated blood pressure machine at a pharmacy.  A home blood pressure monitor.  If you are between 53 years and 75 years old, ask your health  care provider if you should take aspirin to prevent strokes.  Have regular diabetes screenings. This involves taking a blood sample to check your fasting blood sugar level.  If you are at a normal weight and have a low risk for diabetes, have this test once every three years after 68 years of age.  If you are overweight and have a high risk for diabetes, consider being tested at a younger age or more often. Preventing infection Hepatitis B  If you have a higher risk for hepatitis B, you should be screened for this virus. You are considered at high risk for hepatitis B if:  You were born in a country where hepatitis B is common. Ask your health care provider which countries are considered high risk.  Your parents were born in a high-risk country, and you have not been immunized against hepatitis B (hepatitis B vaccine).  You have HIV or AIDS.  You use needles to inject street drugs.  You live with someone who has hepatitis B.  You have had sex with someone who has hepatitis B.  You get hemodialysis treatment.  You take certain medicines for conditions, including cancer, organ transplantation, and autoimmune conditions. Hepatitis C  Blood testing is recommended for:  Everyone born from [birth year] through 1967.  Anyone with known risk factors for hepatitis C. Sexually transmitted infections (STIs)  You should be screened for sexually transmitted infections (STIs) including gonorrhea and chlamydia if:  You are sexually active and are younger than 68 years of age.  You are older than 68 years of age and your health care provider tells you that you are at risk for this type of infection.  Your sexual activity has changed since you were last screened and you are at an increased risk for chlamydia or gonorrhea. Ask your health care provider if you are at risk.  If you do not have HIV, but are at risk, it may be recommended that you take a prescription medicine daily to prevent HIV  infection. This is called pre-exposure prophylaxis (PrEP). You are considered at risk if:  You are sexually active and do not regularly use condoms or know the HIV status of your partner(s).  You take drugs by injection.  You are sexually active with a partner who has HIV. Talk with your health care provider about whether you are at high risk of being infected with HIV. If you choose to begin PrEP, you should first be tested for HIV. You should then be tested every 3  months for as long as you are taking PrEP. Pregnancy  If you are premenopausal and you may become pregnant, ask your health care provider about preconception counseling.  If you may become pregnant, take 400 to 800 micrograms (mcg) of folic acid every day.  If you want to prevent pregnancy, talk to your health care provider about birth control (contraception). Osteoporosis and menopause  Osteoporosis is a disease in which the bones lose minerals and strength with aging. This can result in serious bone fractures. Your risk for osteoporosis can be identified using a bone density scan.  If you are 71 years of age or older, or if you are at risk for osteoporosis and fractures, ask your health care provider if you should be screened.  Ask your health care provider whether you should take a calcium or vitamin D supplement to lower your risk for osteoporosis.  Menopause may have certain physical symptoms and risks.  Hormone replacement therapy may reduce some of these symptoms and risks. Talk to your health care provider about whether hormone replacement therapy is right for you. Follow these instructions at home:  Schedule regular health, dental, and eye exams.  Stay current with your immunizations.  Do not use any tobacco products including cigarettes, chewing tobacco, or electronic cigarettes.  If you are pregnant, do not drink alcohol.  If you are breastfeeding, limit how much and how often you drink alcohol.  Limit  alcohol intake to no more than 1 drink per day for nonpregnant women. One drink equals 12 ounces of beer, 5 ounces of wine, or 1 ounces of hard liquor.  Do not use street drugs.  Do not share needles.  Ask your health care provider for help if you need support or information about quitting drugs.  Tell your health care provider if you often feel depressed.  Tell your health care provider if you have ever been abused or do not feel safe at home. This information is not intended to replace advice given to you by your health care provider. Make sure you discuss any questions you have with your health care provider. Document Released: 02/25/2011 Document Revised: 01/18/2016 Document Reviewed: 05/16/2015  2017 [employer]vier

## 2016-10-07 NOTE — Progress Notes (Signed)
BP (!) 144/70   Pulse 72   Temp 98.6 F (37 C) (Oral)   Wt 184 lb 8 oz (83.7 kg)   BMI 32.17 kg/m    CC: CPE Subjective:    Patient ID: Jennifer Pineda, female    DOB: Mar 17, 1951, 66 y.o.   MRN: [id number]  HPI: Jennifer Pineda is a 66 y.o. female presenting on 10/07/2016 for Annual Exam   Saw Katha Cabal last week for medicare wellness visit. Note reviewed.    H/o bladder cancer - in surveillance Q67mogetting BCG treatments.   Recent fever blisters treating with valtrex 2gm   Preventative: COLONOSCOPY Date: 09/2012 small int hem (Carlean Purl rpt 10 yrs Last pap was 2010, all normal. Hysterectomy age 66yo  Mammogram 08/2015 WNL.  DEXA 06/2014 - WNL Flu shot - today Td 2005, Tdap 2015 Flu - yearly prevnar 2015, pneumovax 08/2015 Shingles - 2015 Advanced directives - would like packet. Would want son ([name] to be HCPOA. No prolonged life support if terminal condition. Full code, ok with temporary ventilation.  Seat belt use discussed Sunscreen use discussed. No changing moles on skin.  Ex smoker - quit 1988 Alcohol - a few drinks a week  Divorced Daughter TEarmon Phoenix lives with her Children: 2 children // one daughter lives with mother/ son lives with father Activity: 20 min walk daily Diet: good water, fruits/vegetables daily.   Relevant past medical, surgical, family and social history reviewed and updated as indicated. Interim medical history since our last visit reviewed. Allergies and medications reviewed and updated. Current Outpatient Prescriptions on File Prior to Visit  Medication Sig  . albuterol (PROVENTIL HFA;VENTOLIN HFA) 108 (90 BASE) MCG/ACT inhaler Inhale 2 puffs into the lungs every 6 (six) hours as needed. For shortness of breath.  .Marland KitchenamLODipine (NORVASC) 10 MG tablet TAKE ONE TABLET BY MOUTH ONCE DAILY  . atorvastatin (LIPITOR) 10 MG tablet Take one tablet daily  . Blood Glucose Monitoring Suppl (ONE TOUCH ULTRA SYSTEM KIT) w/Device KIT 1  kit by Does not apply route once.  .Marland KitchenBREO ELLIPTA 100-25 MCG/INH AEPB INHALE ONE PUFF BY MOUTH ONCE DAILY  . FLUoxetine (PROZAC) 10 MG capsule Take 1 capsule (10 mg total) by mouth daily.  . fluticasone (FLONASE) 50 MCG/ACT nasal spray USE TWO SPRAY(S) IN EACH NOSTRIL ONCE DAILY  . glucose blood (ONE TOUCH ULTRA TEST) test strip 1 each by Other route daily. Use to check sugar once daily and as needed. Dx: E11.9  . glucose blood test strip Use as instructed, One Touch Ultra Blue  . guaifenesin (HUMIBID E) 400 MG TABS Take 400 mg by mouth every morning.   . loratadine (CLARITIN) 10 MG tablet TAKE ONE TABLET BY MOUTH ONCE DAILY  . Multiple Vitamins-Calcium (VIACTIV MULTI-VITAMIN) CHEW Chew 1 tablet by mouth daily.  .Marland Kitchenomeprazole (PRILOSEC) 40 MG capsule TAKE ONE CAPSULE BY MOUTH IN THE MORNING. TAKE 45 MINUTES PRIOR TO BREAKFAST  . ONETOUCH DELICA LANCETS FINE MISC Use as directed - check as needed or 3 times weekly  . tiotropium (SPIRIVA HANDIHALER) 18 MCG inhalation capsule Place 1 capsule (18 mcg total) into inhaler and inhale at bedtime.  . triamterene-hydrochlorothiazide (MAXZIDE-25) 37.5-25 MG tablet Take one tablet daily  . valACYclovir (VALTREX) 1000 MG tablet Take 2 tablets (2,000 mg total) by mouth 2 (two) times daily. x1 day.   No current facility-administered medications on file prior to visit.     Review of Systems  Constitutional: Negative for activity change, appetite change,  chills, fatigue, fever and unexpected weight change.  HENT: Negative for hearing loss.   Eyes: Negative for visual disturbance.  Respiratory: Negative for cough, chest tightness, shortness of breath and wheezing.   Cardiovascular: Negative for chest pain, palpitations and leg swelling.  Gastrointestinal: Negative for abdominal distention, abdominal pain, blood in stool, constipation, diarrhea, nausea and vomiting.  Genitourinary: Negative for difficulty urinating and hematuria.  Musculoskeletal: Negative for  arthralgias, myalgias and neck pain.  Skin: Positive for rash (fever blisters).  Neurological: Negative for dizziness, seizures, syncope and headaches.  Hematological: Negative for adenopathy. Does not bruise/bleed easily.  Psychiatric/Behavioral: Negative for dysphoric mood. The patient is not nervous/anxious.    Per HPI unless specifically indicated in ROS section     Objective:    BP (!) 144/70   Pulse 72   Temp 98.6 F (37 C) (Oral)   Wt 184 lb 8 oz (83.7 kg)   BMI 32.17 kg/m   Wt Readings from Last 3 Encounters:  10/07/16 184 lb 8 oz (83.7 kg)  09/30/16 179 lb 8 oz (81.4 kg)  07/23/16 185 lb (83.9 kg)    Physical Exam  Constitutional: She is oriented to person, place, and time. She appears well-developed and well-nourished. No distress.  HENT:  Head: Normocephalic and atraumatic.  Right Ear: Hearing, tympanic membrane, external ear and ear canal normal.  Left Ear: Hearing, tympanic membrane, external ear and ear canal normal.  Nose: Nose normal.  Mouth/Throat: Uvula is midline, oropharynx is clear and moist and mucous membranes are normal. No oropharyngeal exudate, posterior oropharyngeal edema or posterior oropharyngeal erythema.  Eyes: Conjunctivae and EOM are normal. Pupils are equal, round, and reactive to light. No scleral icterus.  Neck: Normal range of motion. Neck supple.  Cardiovascular: Normal rate, regular rhythm, normal heart sounds and intact distal pulses.   No murmur heard. Pulses:      Radial pulses are 2+ on the right side, and 2+ on the left side.  Pulmonary/Chest: Effort normal and breath sounds normal. No respiratory distress. She has no wheezes. She has no rales.  Abdominal: Soft. Bowel sounds are normal. She exhibits no distension and no mass. There is no tenderness. There is no rebound and no guarding.  Musculoskeletal: Normal range of motion. She exhibits no edema.  Lymphadenopathy:    She has no cervical adenopathy.  Neurological: She is alert  and oriented to person, place, and time.  CN grossly intact, station and gait intact  Skin: Skin is warm and dry. No rash noted.  Psychiatric: She has a normal mood and affect. Her behavior is normal. Judgment and thought content normal.  Nursing note and vitals reviewed.  Results for orders placed or performed in visit on 09/30/16  Comprehensive metabolic panel  Result Value Ref Range   Sodium 137 135 - 145 mEq/L   Potassium 4.6 3.5 - 5.1 mEq/L   Chloride 97 96 - 112 mEq/L   CO2 32 19 - 32 mEq/L   Glucose, Bld 102 (H) 70 - 99 mg/dL   BUN 18 6 - 23 mg/dL   Creatinine, Ser 0.86 0.40 - 1.20 mg/dL   Total Bilirubin 0.7 0.2 - 1.2 mg/dL   Alkaline Phosphatase 110 39 - 117 U/L   AST 23 0 - 37 U/L   ALT 26 0 - 35 U/L   Total Protein 9.2 (H) 6.0 - 8.3 g/dL   Albumin 4.8 3.5 - 5.2 g/dL   Calcium 10.4 8.4 - 10.5 mg/dL   GFR 69.86 >60.00  mL/min  Hemoglobin A1c  Result Value Ref Range   Hgb A1c MFr Bld 5.9 4.6 - 6.5 %  Lipid Panel  Result Value Ref Range   Cholesterol 214 (H) 0 - 200 mg/dL   Triglycerides 121.0 0.0 - 149.0 mg/dL   HDL 68.50 >39.00 mg/dL   VLDL 24.2 0.0 - 40.0 mg/dL   LDL Cholesterol 121 (H) 0 - 99 mg/dL   Total CHOL/HDL Ratio 3    NonHDL 145.30   CBC with Differential/Platelet  Result Value Ref Range   WBC 12.5 (H) 4.0 - 10.5 K/uL   RBC 5.04 3.87 - 5.11 Mil/uL   Hemoglobin 14.4 12.0 - 15.0 g/dL   HCT 42.9 36.0 - 46.0 %   MCV 85.1 78.0 - 100.0 fl   MCHC 33.5 30.0 - 36.0 g/dL   RDW 14.4 11.5 - 15.5 %   Platelets 337.0 150.0 - 400.0 K/uL   Neutrophils Relative % 70.2 43.0 - 77.0 %   Lymphocytes Relative 21.1 12.0 - 46.0 %   Monocytes Relative 6.9 3.0 - 12.0 %   Eosinophils Relative 1.1 0.0 - 5.0 %   Basophils Relative 0.7 0.0 - 3.0 %   Neutro Abs 8.8 (H) 1.4 - 7.7 K/uL   Lymphs Abs 2.6 0.7 - 4.0 K/uL   Monocytes Absolute 0.9 0.1 - 1.0 K/uL   Eosinophils Absolute 0.1 0.0 - 0.7 K/uL   Basophils Absolute 0.1 0.0 - 0.1 K/uL  Microalbumin/Creatinine Ratio, Urine    Result Value Ref Range   Microalb, Ur 3.9 (H) 0.0 - 1.9 mg/dL   Creatinine,U 197.7 mg/dL   Microalb Creat Ratio 2.0 0.0 - 30.0 mg/g  Hepatitis C antibody  Result Value Ref Range   HCV Ab NEGATIVE NEGATIVE      Assessment & Plan:   Problem List Items Addressed This Visit    Advanced care planning/counseling discussion    Advanced directives - would like packet. Would want son [name]) to be HCPOA. No prolonged life support if terminal condition. Full code, ok with temporary ventilation.       Chronic cough    Stable on current respiratory regimen.       COPD, mild (HCC)    Chronic, stable on current regimen of breo and spiriva.       Essential hypertension    Chronic, mildly elevated today. No med changes today.       Healthcare maintenance - Primary    Preventative protocols reviewed and updated unless pt declined. Discussed healthy diet and lifestyle.       HYPERCHOLESTEROLEMIA    Chronic, stable. Continue lipitor.       Prediabetes    Marked improvement with healthy diet changes - now in prediabetic range. Will change.       Urothelial carcinoma ([place])    Followed by urology - appreciate their care. Receiving BCG treatments.       Other Visit Diagnoses    Need for influenza vaccination       Relevant Orders   Flu Vaccine QUAD 36+ mos PF IM (Fluarix & Fluzone Quad PF) (Completed)       Follow up plan: Return in about 1 year (around 10/07/2017), or if symptoms worsen or fail to improve, for annual exam, prior fasting for blood work, medicare wellness visit.  Ria Bush, MD

## 2016-10-07 NOTE — Assessment & Plan Note (Signed)
Preventative protocols reviewed and updated unless pt declined. Discussed healthy diet and lifestyle.  

## 2016-10-07 NOTE — Assessment & Plan Note (Signed)
Chronic, stable. Continue lipitor.  

## 2016-10-07 NOTE — Assessment & Plan Note (Signed)
Stable on current respiratory regimen.

## 2016-10-07 NOTE — Assessment & Plan Note (Signed)
Chronic, stable on current regimen of breo and spiriva.

## 2016-10-07 NOTE — Assessment & Plan Note (Signed)
Followed by urology - appreciate their care. Receiving BCG treatments.

## 2016-10-07 NOTE — Progress Notes (Signed)
Pre visit review using our clinic review tool, if applicable. No additional management support is needed unless otherwise documented below in the visit note. 

## 2016-10-07 NOTE — Assessment & Plan Note (Signed)
Advanced directives - would like packet. Would want son [name]) to be HCPOA. No prolonged life support if terminal condition. Full code, ok with temporary ventilation.

## 2016-10-07 NOTE — Assessment & Plan Note (Signed)
Chronic, mildly elevated today. No med changes today. 

## 2016-10-16 ENCOUNTER — Other Ambulatory Visit: Payer: Self-pay | Admitting: Family Medicine

## 2016-10-21 ENCOUNTER — Other Ambulatory Visit: Payer: Self-pay | Admitting: Family Medicine

## 2016-10-21 MED ORDER — AMLODIPINE BESYLATE 10 MG PO TABS: 10.0000 mg | ORAL_TABLET | Freq: Every day | ORAL | 3 refills | Status: DC

## 2016-10-24 ENCOUNTER — Ambulatory Visit (INDEPENDENT_AMBULATORY_CARE_PROVIDER_SITE_OTHER): Payer: PPO | Admitting: Pulmonary Disease

## 2016-10-24 ENCOUNTER — Encounter: Payer: Self-pay | Admitting: Pulmonary Disease

## 2016-10-24 VITALS — BP 136/68 | HR 62 | Wt 185.0 lb

## 2016-10-24 DIAGNOSIS — R0602 Shortness of breath: Secondary | ICD-10-CM

## 2016-10-24 DIAGNOSIS — R059 Cough, unspecified: Secondary | ICD-10-CM

## 2016-10-24 DIAGNOSIS — R05 Cough: Secondary | ICD-10-CM | POA: Diagnosis not present

## 2016-10-24 DIAGNOSIS — Z87891 Personal history of nicotine dependence: Secondary | ICD-10-CM | POA: Diagnosis not present

## 2016-10-24 NOTE — Patient Instructions (Addendum)
You may try off of the Spiriva inhaler and remain off if you have no increase in symptoms of shortness of breath or cough.   Continue Breo inhaler as previously. Continue albuterol as needed.   We also discussed trying off of some of these other medications that were started when you were having trouble with cough. It is okay for you to try off of guaifenesin and Flonase nasal inhaler. If you are going to do so, stop one at a time to make an assessment of whether that change has made any difference in your symptoms  Follow-up as needed

## 2016-10-25 NOTE — Progress Notes (Signed)
PULMONARY OFFICE FOLLOW UP NOTE   PT PROFILE: 66 y.o. F former smoker previously evaluated by VM for cough, DOE and treated with ICS/LABA, LAMA  PROBLEMS:  Former smoker Chronic cough Mild DOE  DATA: 04/23/12 CT chest: normal 04/08/16 PFTs: No obstruction, normal TLC and DLCO  INTERVAL HISTORY: Last seen by VM 04/23/16. No major events  SUBJ: This is a routine reevaluation. Patient has no new complaints. She has minimal cough and mild shortness of breath which has not changed since last evaluation. She continues to use Breo inhaler. She also has a Spiriva inhaler which she only uses intermittently. She has an albuterol rescue inhaler which she rarely uses. She is also on guaifenesin and Flonase nasal inhaler which were prescribed for cough. She denies significant nasal congestion or sinus symptoms. Denies CP, fever, purulent sputum, hemoptysis, LE edema and calf tenderness  OBJ: Vitals:   10/24/16 1043  BP: 136/68  Pulse: 62  SpO2: 98%  Weight: 185 lb (83.9 kg)    Gen: WDWN in NAD HEENT: All WNL Neck: NO LAN, no JVD noted Lungs: full BS, normal percussion, no adventitious sounds Cardiovascular: Reg rate, normal rhythm, no M noted Abdomen: Soft, NT +BS Ext: no C/C/E Neuro: CNs intact, motor/sens grossly intact Skin: No lesions noted    DATA: No new labs chest x-ray or pulmonary function tests to report  IMPRESSION: Cough  Shortness of breath  Former smoker   PLAN: 1) discontinue Spiriva inhaler altogether. 2) continue Breo inhaler as previously and continue albuterol inhaler when necessary 3) she may change guaifenesin to PRN and may try off of Flonase or use only as needed 4) follow-up as needed   Merton Border, MD PCCM service Mobile [phone] Pager [phone] 10/25/2016

## 2016-10-29 ENCOUNTER — Telehealth: Payer: Self-pay

## 2016-10-29 DIAGNOSIS — K130 Diseases of lips: Secondary | ICD-10-CM

## 2016-10-29 NOTE — Telephone Encounter (Signed)
Please notify patient that Dr. Darnell Level is out of the office.  I'm sorry to hear about this. Has she tried anything for the peeling? Vaseline? Chapstick?

## 2016-10-29 NOTE — Telephone Encounter (Signed)
Pt left v/m; pt has seen Dr Darnell Level before about fever blisters on her lips; the blisters are gone but the lips are peeling badly and pt request referral to dermatologist.

## 2016-10-30 NOTE — Telephone Encounter (Signed)
Spoken to patient and she stated that yes, she have tried vaseline and chpstick also other lip products over the counter. It have been over 1 month now. The blisters are gone but lips still cracking and not healing.

## 2016-10-30 NOTE — Telephone Encounter (Addendum)
Noted, we can refer her to dermatology but it may take a month to get in. I recommend she try OTC hydrocortisone cream. Apply to lips twice daily. Have her update [place] mid next week.

## 2016-10-31 ENCOUNTER — Other Ambulatory Visit: Payer: Self-pay | Admitting: Family Medicine

## 2016-10-31 DIAGNOSIS — D72829 Elevated white blood cell count, unspecified: Secondary | ICD-10-CM

## 2016-10-31 DIAGNOSIS — R779 Abnormality of plasma protein, unspecified: Secondary | ICD-10-CM

## 2016-10-31 DIAGNOSIS — E8809 Other disorders of plasma-protein metabolism, not elsewhere classified: Secondary | ICD-10-CM

## 2016-10-31 NOTE — Telephone Encounter (Signed)
Noted, referral placed.  

## 2016-10-31 NOTE — Telephone Encounter (Signed)
Spoken and notified patient of Kate's comments. Patient will do as instructed and will call us to let us with an update. Patient stated that she would like to dermatology referral still.

## 2016-11-04 ENCOUNTER — Other Ambulatory Visit (INDEPENDENT_AMBULATORY_CARE_PROVIDER_SITE_OTHER): Payer: PPO

## 2016-11-04 DIAGNOSIS — D72829 Elevated white blood cell count, unspecified: Secondary | ICD-10-CM | POA: Diagnosis not present

## 2016-11-04 DIAGNOSIS — E8809 Other disorders of plasma-protein metabolism, not elsewhere classified: Secondary | ICD-10-CM | POA: Diagnosis not present

## 2016-11-04 DIAGNOSIS — R779 Abnormality of plasma protein, unspecified: Secondary | ICD-10-CM

## 2016-11-04 LAB — CBC WITH DIFFERENTIAL/PLATELET
BASOS PCT: 0.7 % (ref 0.0–3.0)
Basophils Absolute: 0.1 10*3/uL (ref 0.0–0.1)
EOS ABS: 0.3 10*3/uL (ref 0.0–0.7)
EOS PCT: 2.8 % (ref 0.0–5.0)
HEMATOCRIT: 41.1 % (ref 36.0–46.0)
Hemoglobin: 13.8 g/dL (ref 12.0–15.0)
LYMPHS PCT: 25.4 % (ref 12.0–46.0)
Lymphs Abs: 2.7 10*3/uL (ref 0.7–4.0)
MCHC: 33.6 g/dL (ref 30.0–36.0)
MCV: 84.4 fl (ref 78.0–100.0)
Monocytes Absolute: 0.8 10*3/uL (ref 0.1–1.0)
Monocytes Relative: 7.9 % (ref 3.0–12.0)
NEUTROS ABS: 6.7 10*3/uL (ref 1.4–7.7)
Neutrophils Relative %: 63.2 % (ref 43.0–77.0)
PLATELETS: 294 10*3/uL (ref 150.0–400.0)
RBC: 4.86 Mil/uL (ref 3.87–5.11)
RDW: 14.7 % (ref 11.5–15.5)
WBC: 10.7 10*3/uL — AB (ref 4.0–10.5)

## 2016-11-04 LAB — HEPATIC FUNCTION PANEL
ALBUMIN: 4.1 g/dL (ref 3.5–5.2)
ALK PHOS: 105 U/L (ref 39–117)
ALT: 22 U/L (ref 0–35)
AST: 16 U/L (ref 0–37)
BILIRUBIN DIRECT: 0 mg/dL (ref 0.0–0.3)
TOTAL PROTEIN: 8.1 g/dL (ref 6.0–8.3)
Total Bilirubin: 0.4 mg/dL (ref 0.2–1.2)

## 2016-11-04 LAB — SEDIMENTATION RATE: Sed Rate: 53 mm/hr — ABNORMAL HIGH (ref 0–30)

## 2016-11-05 ENCOUNTER — Other Ambulatory Visit: Payer: Self-pay | Admitting: Family Medicine

## 2016-11-07 ENCOUNTER — Ambulatory Visit
Admission: RE | Admit: 2016-11-07 | Discharge: 2016-11-07 | Disposition: A | Discharge status: Home / Self Care | Payer: PPO | Source: Ambulatory Visit | Attending: Family Medicine | Admitting: Family Medicine

## 2016-11-07 DIAGNOSIS — Z1231 Encounter for screening mammogram for malignant neoplasm of breast: Secondary | ICD-10-CM | POA: Diagnosis not present

## 2016-11-07 DIAGNOSIS — Z1239 Encounter for other screening for malignant neoplasm of breast: Secondary | ICD-10-CM

## 2016-11-07 LAB — PROTEIN ELECTROPHORESIS, SERUM, WITH REFLEX
ALBUMIN ELP: 3.9 g/dL (ref 3.8–4.8)
ALPHA-2-GLOBULIN: 0.8 g/dL (ref 0.5–0.9)
Alpha-1-Globulin: 0.3 g/dL (ref 0.2–0.3)
BETA 2: 0.6 g/dL — AB (ref 0.2–0.5)
BETA GLOBULIN: 0.6 g/dL (ref 0.4–0.6)
Gamma Globulin: 1.4 g/dL (ref 0.8–1.7)
Total Protein, Serum Electrophoresis: 7.6 g/dL (ref 6.1–8.1)

## 2016-11-07 LAB — IFE INTERPRETATION

## 2016-11-08 LAB — HM MAMMOGRAPHY

## 2016-11-11 ENCOUNTER — Encounter: Payer: Self-pay | Admitting: *Deleted

## 2016-11-14 ENCOUNTER — Other Ambulatory Visit: Payer: Self-pay | Admitting: Family Medicine

## 2016-11-18 DIAGNOSIS — K130 Diseases of lips: Secondary | ICD-10-CM | POA: Diagnosis not present

## 2016-11-26 DIAGNOSIS — Z5111 Encounter for antineoplastic chemotherapy: Secondary | ICD-10-CM | POA: Diagnosis not present

## 2016-11-26 DIAGNOSIS — C672 Malignant neoplasm of lateral wall of bladder: Secondary | ICD-10-CM | POA: Diagnosis not present

## 2016-12-03 DIAGNOSIS — C672 Malignant neoplasm of lateral wall of bladder: Secondary | ICD-10-CM | POA: Diagnosis not present

## 2016-12-03 DIAGNOSIS — Z5111 Encounter for antineoplastic chemotherapy: Secondary | ICD-10-CM | POA: Diagnosis not present

## 2016-12-10 DIAGNOSIS — Z5111 Encounter for antineoplastic chemotherapy: Secondary | ICD-10-CM | POA: Diagnosis not present

## 2016-12-10 DIAGNOSIS — C672 Malignant neoplasm of lateral wall of bladder: Secondary | ICD-10-CM | POA: Diagnosis not present

## 2016-12-15 ENCOUNTER — Other Ambulatory Visit: Payer: Self-pay | Admitting: Family Medicine

## 2016-12-22 ENCOUNTER — Other Ambulatory Visit: Payer: Self-pay | Admitting: Internal Medicine

## 2016-12-22 ENCOUNTER — Other Ambulatory Visit: Payer: Self-pay | Admitting: Family Medicine

## 2017-02-03 DIAGNOSIS — R310 Gross hematuria: Secondary | ICD-10-CM | POA: Diagnosis not present

## 2017-02-03 DIAGNOSIS — C672 Malignant neoplasm of lateral wall of bladder: Secondary | ICD-10-CM | POA: Diagnosis not present

## 2017-02-11 DIAGNOSIS — R310 Gross hematuria: Secondary | ICD-10-CM | POA: Diagnosis not present

## 2017-03-17 ENCOUNTER — Other Ambulatory Visit: Payer: Self-pay | Admitting: Internal Medicine

## 2017-04-17 ENCOUNTER — Other Ambulatory Visit: Payer: Self-pay | Admitting: Dermatology

## 2017-04-17 DIAGNOSIS — D485 Neoplasm of uncertain behavior of skin: Secondary | ICD-10-CM | POA: Diagnosis not present

## 2017-04-17 DIAGNOSIS — L814 Other melanin hyperpigmentation: Secondary | ICD-10-CM | POA: Diagnosis not present

## 2017-04-17 DIAGNOSIS — L249 Irritant contact dermatitis, unspecified cause: Secondary | ICD-10-CM | POA: Diagnosis not present

## 2017-04-24 ENCOUNTER — Encounter: Payer: Self-pay | Admitting: Family Medicine

## 2017-04-24 DIAGNOSIS — K130 Diseases of lips: Secondary | ICD-10-CM | POA: Insufficient documentation

## 2017-06-09 IMAGING — MG MM DIGITAL SCREENING BILAT W/ CAD
5 series · 5 of 5 positions shown · non-contrast
Comparison: Previous exam(s).

CLINICAL DATA: Screening.

EXAM:
DIGITAL SCREENING BILATERAL MAMMOGRAM WITH CAD

[R MLO (1 of 2)]
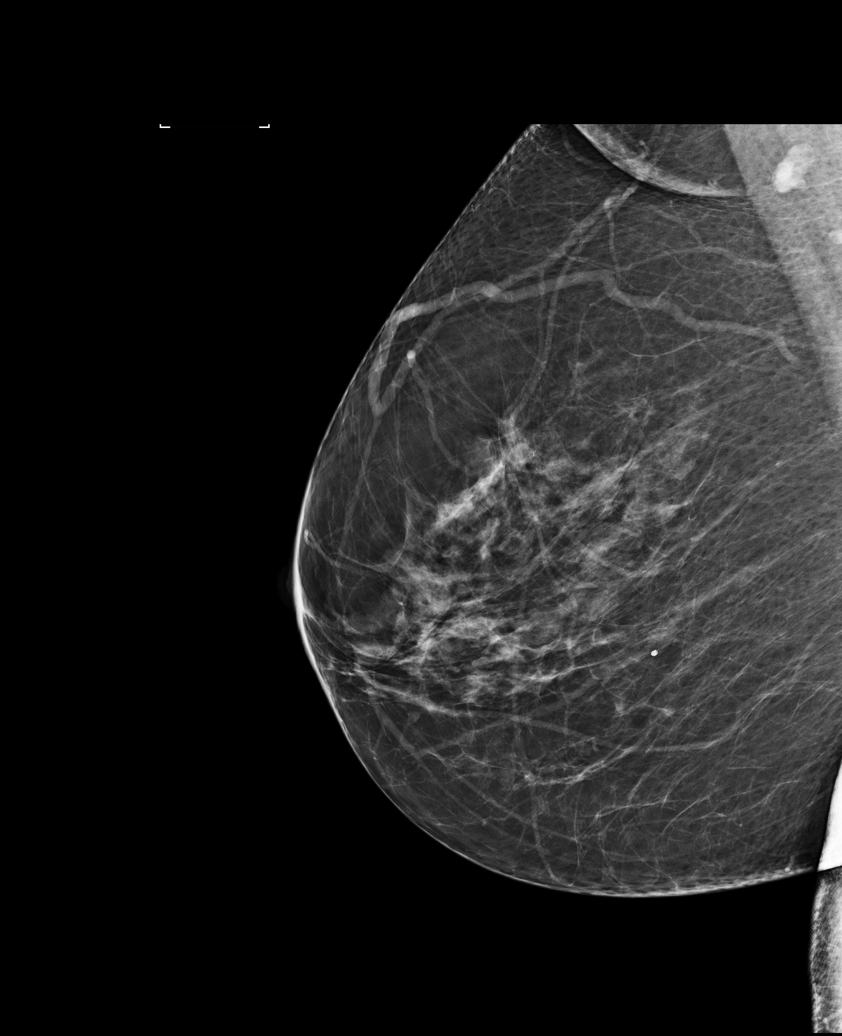

[R CC]
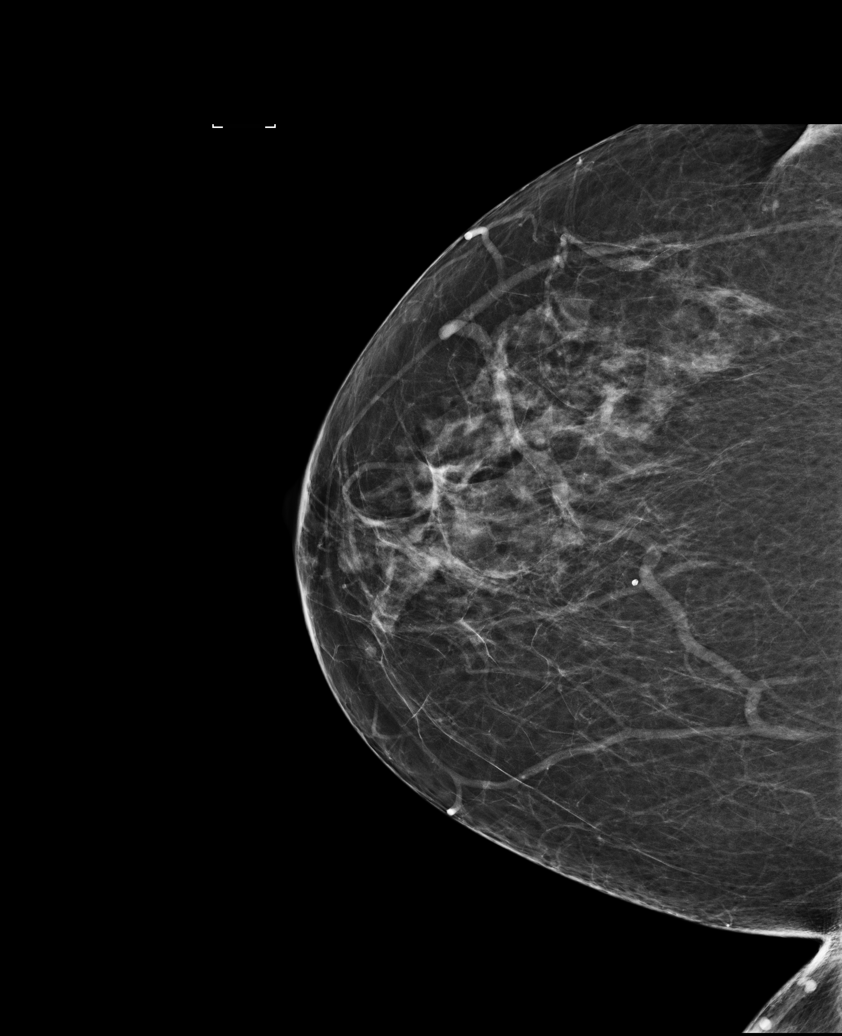

[L MLO]
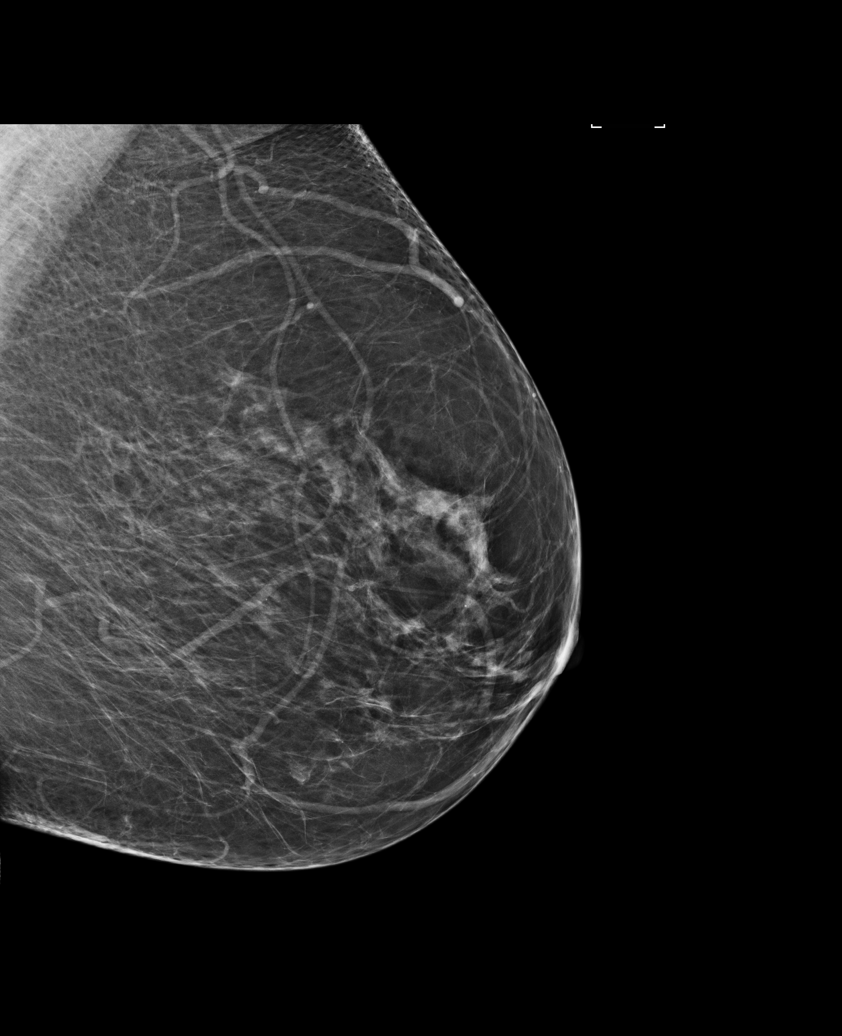

[R MLO (2 of 2)]
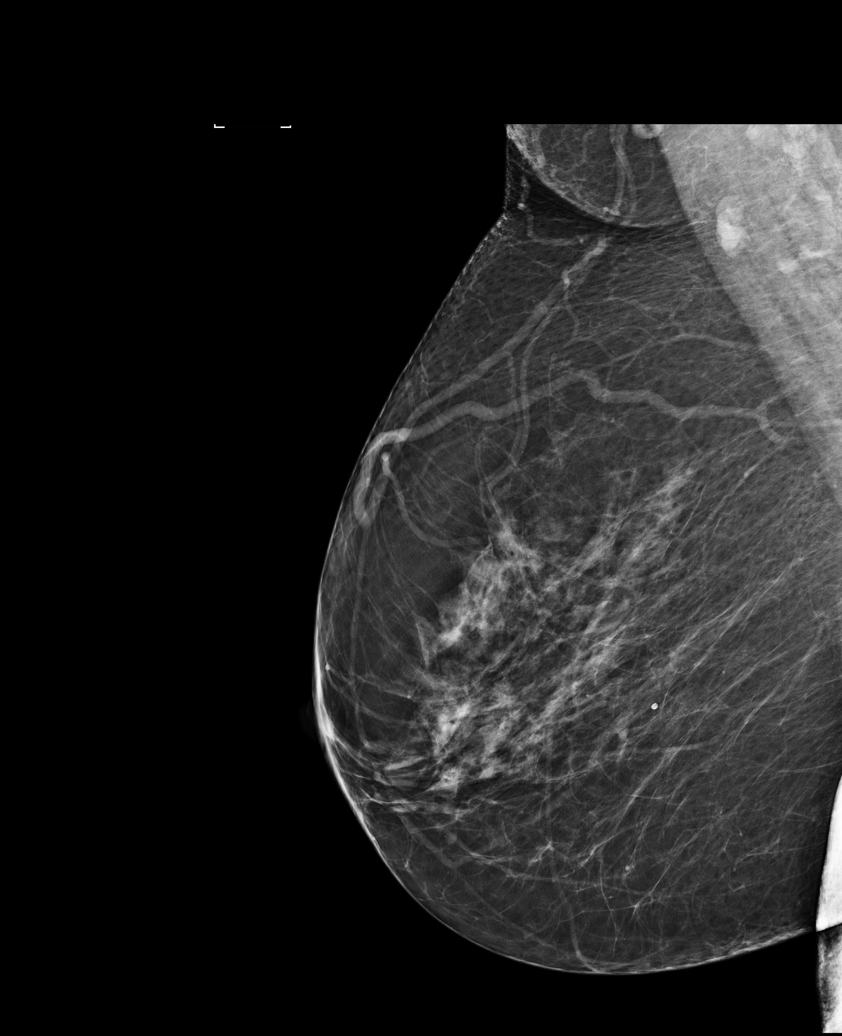

[L CC]
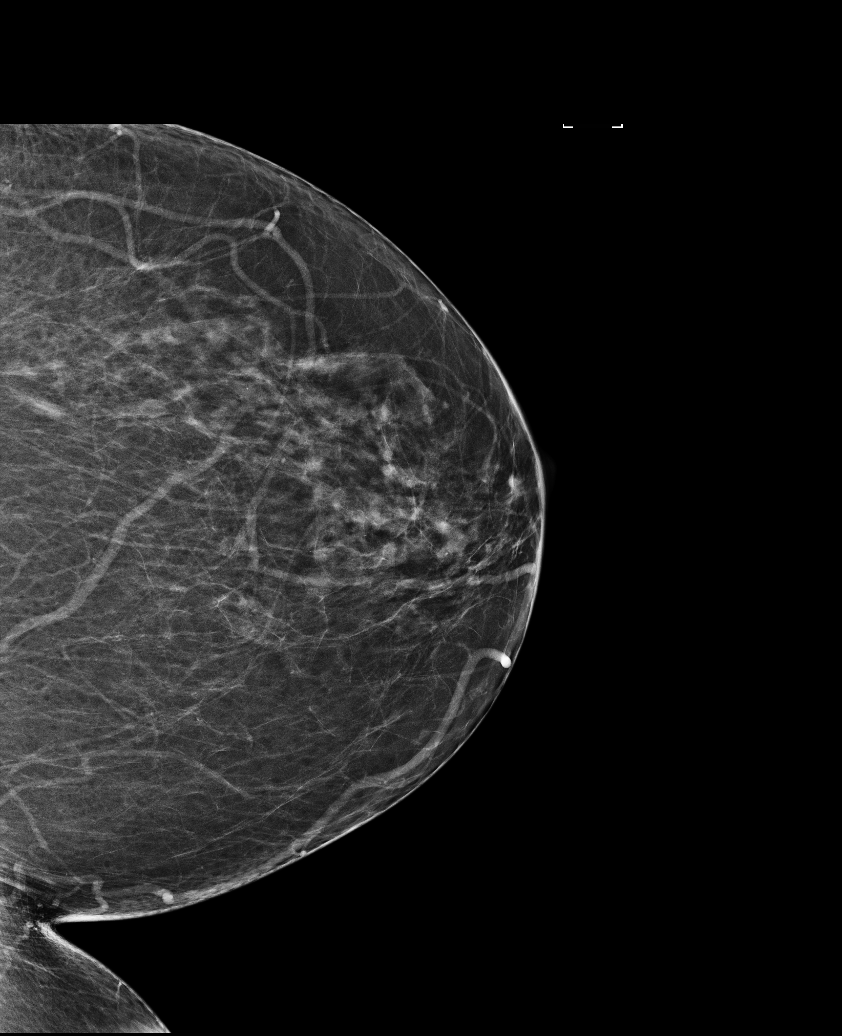

[5 of 5 positions shown; findings below may reference images not displayed]

ACR Breast Density Category b: There are scattered areas of
fibroglandular density.
FINDINGS: There are no findings suspicious for malignancy. Images were
processed with CAD.
IMPRESSION: No mammographic evidence of malignancy. A result letter of this
screening mammogram will be mailed directly to the patient.

RECOMMENDATION:
Screening mammogram in one year. (Code:AS-G-LCT)

BI-RADS CATEGORY  1: Negative.

## 2017-06-18 ENCOUNTER — Other Ambulatory Visit: Payer: Self-pay | Admitting: Internal Medicine

## 2017-06-19 ENCOUNTER — Telehealth: Payer: Self-pay | Admitting: Family Medicine

## 2017-06-19 NOTE — Telephone Encounter (Signed)
Spoke with pt says she is in [facility] and is asking for recommendations to get her to Jan 2019.

## 2017-06-19 NOTE — Telephone Encounter (Signed)
Copied from [place]. Topic: Quick Communication - See Telephone Encounter >> Jun 19, 2017  1:11 PM Ether Griffins B wrote: CRM for notification. See Telephone encounter for:  06/19/17.  Pt is wanting Dr. Danise Mina nurse to call about a different inhaler. The one she just went to get from the pharmacy for one month was $337.

## 2017-06-19 NOTE — Telephone Encounter (Signed)
Which inhaler? spiriva or breo? spiriva is probably the cheapest of that class. If breo, would have her check if advair or symbicort are cheaper - check with pharmacy. We will send in cheaper alternative.

## 2017-06-23 NOTE — Telephone Encounter (Signed)
Spoke with pt relaying message per Dr. Darnell Level. She says it is the [place]. So she will check with the pharmacy about Advair and Symbicort to see which is cheaper and let Dr. Darnell Level know.

## 2017-07-31 DIAGNOSIS — E119 Type 2 diabetes mellitus without complications: Secondary | ICD-10-CM | POA: Diagnosis not present

## 2017-07-31 LAB — HM DIABETES EYE EXAM

## 2017-08-06 DIAGNOSIS — Z8551 Personal history of malignant neoplasm of bladder: Secondary | ICD-10-CM | POA: Diagnosis not present

## 2017-08-27 DIAGNOSIS — Z5111 Encounter for antineoplastic chemotherapy: Secondary | ICD-10-CM | POA: Diagnosis not present

## 2017-08-27 DIAGNOSIS — C672 Malignant neoplasm of lateral wall of bladder: Secondary | ICD-10-CM | POA: Diagnosis not present

## 2017-09-03 DIAGNOSIS — Z5111 Encounter for antineoplastic chemotherapy: Secondary | ICD-10-CM | POA: Diagnosis not present

## 2017-09-03 DIAGNOSIS — C672 Malignant neoplasm of lateral wall of bladder: Secondary | ICD-10-CM | POA: Diagnosis not present

## 2017-09-12 DIAGNOSIS — C672 Malignant neoplasm of lateral wall of bladder: Secondary | ICD-10-CM | POA: Diagnosis not present

## 2017-09-12 DIAGNOSIS — Z5111 Encounter for antineoplastic chemotherapy: Secondary | ICD-10-CM | POA: Diagnosis not present

## 2017-09-30 ENCOUNTER — Telehealth: Payer: Self-pay | Admitting: Family Medicine

## 2017-09-30 NOTE — Telephone Encounter (Signed)
I spoke with pt and the last cpap machine was possibly given by a pulmonologist, Dr Alva Garnet but has been a very long time ago. Pt said it has been long time since had sleep study also. Pt wants to know if Dr Darnell Level will send order to [facility] Phone [phone]. Pt had spoken with Christa at [facility]. Pt said cpap cuts off during the night; pt has to cut off machine and cut it back on and it starts working again.per chart review pt saw Dr Alva Garnet 10/24/16 and last saw Dr Darnell Level on 10/07/16 for annual. Pt request cb.

## 2017-09-30 NOTE — Telephone Encounter (Signed)
Copied from [place]. Topic: Quick Communication - See Telephone Encounter >> Sep 30, 2017 10:48 AM Ether Griffins B wrote: CRM for notification. See Telephone encounter for:  Pt calling in states home health nurse states she is due for a new cpap machine and she is having problems with hers. She would like a new one ordered.  09/30/17.

## 2017-10-01 ENCOUNTER — Other Ambulatory Visit: Payer: Self-pay | Admitting: Family Medicine

## 2017-10-01 NOTE — Telephone Encounter (Signed)
Spoke with pt relaying message per Dr. Oneita Jolly she will call pulmonologist.

## 2017-10-01 NOTE — Telephone Encounter (Signed)
Recommend this go through pulm who manages [facility] - last saw Dr Stevenson Clinch 10/2014. Would have her call pulm office - last seen there 10/2016.

## 2017-10-11 ENCOUNTER — Other Ambulatory Visit: Payer: Self-pay | Admitting: Family Medicine

## 2017-10-27 ENCOUNTER — Other Ambulatory Visit: Payer: Self-pay | Admitting: Family Medicine

## 2017-10-27 DIAGNOSIS — Z1231 Encounter for screening mammogram for malignant neoplasm of breast: Secondary | ICD-10-CM

## 2017-10-30 DIAGNOSIS — S62616A Displaced fracture of proximal phalanx of right little finger, initial encounter for closed fracture: Secondary | ICD-10-CM | POA: Diagnosis not present

## 2017-11-06 ENCOUNTER — Ambulatory Visit (INDEPENDENT_AMBULATORY_CARE_PROVIDER_SITE_OTHER): Payer: PPO | Admitting: Internal Medicine

## 2017-11-06 ENCOUNTER — Encounter: Payer: Self-pay | Admitting: Internal Medicine

## 2017-11-06 VITALS — BP 132/80 | HR 65 | Temp 98.3°F | Wt 190.0 lb

## 2017-11-06 DIAGNOSIS — J069 Acute upper respiratory infection, unspecified: Secondary | ICD-10-CM | POA: Diagnosis not present

## 2017-11-06 DIAGNOSIS — J029 Acute pharyngitis, unspecified: Secondary | ICD-10-CM

## 2017-11-06 DIAGNOSIS — B9789 Other viral agents as the cause of diseases classified elsewhere: Secondary | ICD-10-CM | POA: Diagnosis not present

## 2017-11-06 MED ORDER — HYDROCODONE-HOMATROPINE 5-1.5 MG/5ML PO SYRP: 5.0000 mL | ORAL_SOLUTION | Freq: Three times a day (TID) | ORAL | 0 refills | Status: DC | PRN

## 2017-11-06 NOTE — Patient Instructions (Signed)
Upper Respiratory Infection, Adult Most upper respiratory infections (URIs) are caused by a virus. A URI affects the nose, throat, and upper air passages. The most common type of URI is often called "the common cold." Follow these instructions at home:  Take medicines only as told by your doctor.  Gargle warm saltwater or take cough drops to comfort your throat as told by your doctor.  Use a warm mist humidifier or inhale steam from a shower to increase air moisture. This may make it easier to breathe.  Drink enough fluid to keep your pee (urine) clear or pale yellow.  Eat soups and other clear broths.  Have a healthy diet.  Rest as needed.  Go back to work when your fever is gone or your doctor says it is okay. ? You may need to stay home longer to avoid giving your URI to others. ? You can also wear a face mask and wash your hands often to prevent spread of the virus.  Use your inhaler more if you have asthma.  Do not use any tobacco products, including cigarettes, chewing tobacco, or electronic cigarettes. If you need help quitting, ask your doctor. Contact a doctor if:  You are getting worse, not better.  Your symptoms are not helped by medicine.  You have chills.  You are getting more short of breath.  You have brown or red mucus.  You have yellow or brown discharge from your nose.  You have pain in your face, especially when you bend forward.  You have a fever.  You have puffy (swollen) neck glands.  You have pain while swallowing.  You have white areas in the back of your throat. Get help right away if:  You have very bad or constant: ? Headache. ? Ear pain. ? Pain in your forehead, behind your eyes, and over your cheekbones (sinus pain). ? Chest pain.  You have long-lasting (chronic) lung disease and any of the following: ? Wheezing. ? Long-lasting cough. ? Coughing up blood. ? A change in your usual mucus.  You have a stiff neck.  You have  changes in your: ? Vision. ? Hearing. ? Thinking. ? Mood. This information is not intended to replace advice given to you by your health care provider. Make sure you discuss any questions you have with your health care provider. Document Released: 01/29/2008 Document Revised: 04/14/2016 Document Reviewed: 11/17/2013 Elsevier Interactive Patient Education  2018 [employer]  

## 2017-11-06 NOTE — Progress Notes (Signed)
HPI  Pt presents to the clinic today with c/o runny nose, sore throat and cough. This started 2-3 days ago. She is blowing clear mucous out of her nose. She denies difficulty swallowing. The cough is productive of yellow/green mucous.She denies fever, chills or body aches. She has tried Mucinex, Claritin and Nasonex with minimal relief. She has a history of allergies, asthma and COPD. She has had sick contacts.   Review of Systems      Past Medical History:  Diagnosis Date  . Allergic rhinitis   . Anxiety and depression   . Arthritis    hands  . Asthma, persistent   . CAD (coronary artery disease)    per CT 06-02-2015  . Complication of anesthesia    slow to wake  . COPD, mild ([place])    pulmologist--  dr Vilinda Boehringer ([facility] in [place])  . Diet-controlled diabetes mellitus ([place]) 05/01/2011   Completed DSME 10/2015   . Diverticulosis of colon   . Essential hypertension   . GERD (gastroesophageal reflux disease)   . History of cellulitis    Nov 2015-- nasal cellulitis--  resolved  . History of endometriosis   . History of hiatal hernia   . Hyperlipidemia   . OSA on CPAP    severe per study 05-09-2010  . PONV (postoperative nausea and vomiting)   . Shingles 12/29/2006  . Sleep apnea   . Urothelial carcinoma ([place]) 2016   superficial papillary L lateral bladder wall s/p BCG therapy ([place])    Family History  Problem Relation Age of Onset  . Cancer Mother        kidney and lymphoma CA, neprectomy tumor of lung, non smoker  . Kidney disease Mother        T-cell cancer in kidney  . Aneurysm Father        ? in throat  . Diabetes Father   . Heart disease Father        MI  . Diabetes Sister   . Hyperlipidemia Sister   . Heart disease Brother 18       MI  . Alcohol abuse Brother        smokes again  . Hypertension Brother   . Cancer Brother 79       T-cell renal cancer/ removed  . Hypertension Brother   . Heart disease Maternal Aunt        MI  . Colon  cancer Neg Hx   . Esophageal cancer Neg Hx   . Rectal cancer Neg Hx   . Stomach cancer Neg Hx   . Breast cancer Neg Hx     Social History   Socioeconomic History  . Marital status: Divorced    Spouse name: Not on file  . Number of children: 2  . Years of education: Not on file  . Highest education level: Not on file  Social Needs  . Financial resource strain: Not on file  . Food insecurity - worry: Not on file  . Food insecurity - inability: Not on file  . Transportation needs - medical: Not on file  . Transportation needs - non-medical: Not on file  Occupational History  . Occupation: Event organiser: [employer]    Comment: [employer]  Tobacco Use  . Smoking status: Former Smoker    Packs/day: 2.00    Years: 19.00    Pack years: 38.00    Types: Cigarettes    Last attempt to quit:  08/26/1986    Years since quitting: 31.2  . Smokeless tobacco: Never Used  Substance and Sexual Activity  . Alcohol use: No    Alcohol/week: 1.2 oz    Types: 2 Standard drinks or equivalent per week    Comment: 2 mixed drinks every week; not recently due to bladder cancer  . Drug use: No  . Sexual activity: No  Other Topics Concern  . Not on file  Social History Narrative   Divorced   Daughter [name], lives with her   Children: 2 children // one daughter lives with mother/ son lives with father   Activity: 20 min walk daily   Diet: good water, fruits/vegetables daily.     Allergies  Allergen Reactions  . Levofloxacin Shortness Of Breath and Swelling  . Ampicillin Diarrhea and Other (See Comments)     stomach upset, tolerated amoxicillin ok  . Doxycycline Hyclate Diarrhea and Other (See Comments)     stomach upset  . Metoprolol Succinate Diarrhea and Other (See Comments)    stomach upset  . Other Swelling    Horseradish  . Tetracycline Hcl Diarrhea and Other (See Comments)     stomach upset  . Adhesive [Tape] Rash     Constitutional: Positive  headache, fatigue and fever. Denies headache, fatigue, fever or abrupt weight changes.  HEENT:  Positive runny nose, sore throat. Denies eye redness, eye pain, pressure behind the eyes, facial pain, nasal congestion, ear pain, ringing in the ears, wax buildup, runny nose or bloody nose. Respiratory: Positive cough. Denies difficulty breathing or shortness of breath.  Cardiovascular: Denies chest pain, chest tightness, palpitations or swelling in the hands or feet.   No other specific complaints in a complete review of systems (except as listed in HPI above).  Objective:   BP 132/80   Pulse 65   Temp 98.3 F (36.8 C) (Oral)   Wt 190 lb (86.2 kg)   SpO2 97%   BMI 33.13 kg/m  Wt Readings from Last 3 Encounters:  11/06/17 190 lb (86.2 kg)  10/24/16 185 lb (83.9 kg)  10/07/16 184 lb 8 oz (83.7 kg)     General: Appears her stated age, in NAD. HEENT: Head: normal shape and size, no sinus tendernessnoted; Ears: Tm's gray and intact, normal light reflex; Nose: mucosa pink and moist, septum midline; Throat/Mouth: + PND. Teeth present, mucosa erythematous and moist, Tonsils 1+, no exudate noted, no lesions or ulcerations noted.  Neck: Bilateral anterior cervical lymphadenopathy.  Pulmonary/Chest: Normal effort and positive vesicular breath sounds. No respiratory distress. No wheezes, rales or ronchi noted.       Assessment & Plan:   Viral URI with Cough:  Get some rest and drink plenty of water Continue Mucinex and inhalers eRx for Hycodan cough syrup  Sore Throat:  RST: negative Do salt water gargles for the sore throat 80 mg Depo IM today Ibuprofen 600 mg every 8 hours as needed  RTC as needed or if symptoms persist.   Webb Silversmith, NP

## 2017-11-09 ENCOUNTER — Other Ambulatory Visit: Payer: Self-pay | Admitting: Family Medicine

## 2017-11-09 DIAGNOSIS — R7303 Prediabetes: Secondary | ICD-10-CM

## 2017-11-09 DIAGNOSIS — E7800 Pure hypercholesterolemia, unspecified: Secondary | ICD-10-CM

## 2017-11-10 ENCOUNTER — Ambulatory Visit
Admission: RE | Admit: 2017-11-10 | Discharge: 2017-11-10 | Disposition: A | Discharge status: Home / Self Care | Payer: PPO | Source: Ambulatory Visit | Attending: Family Medicine | Admitting: Family Medicine

## 2017-11-10 DIAGNOSIS — Z1231 Encounter for screening mammogram for malignant neoplasm of breast: Secondary | ICD-10-CM | POA: Diagnosis not present

## 2017-11-10 LAB — HM MAMMOGRAPHY

## 2017-11-11 ENCOUNTER — Ambulatory Visit (INDEPENDENT_AMBULATORY_CARE_PROVIDER_SITE_OTHER): Payer: PPO

## 2017-11-11 ENCOUNTER — Encounter: Payer: Self-pay | Admitting: Family Medicine

## 2017-11-11 VITALS — BP 148/70 | HR 53 | Temp 97.8°F | Ht 63.5 in | Wt 187.0 lb

## 2017-11-11 DIAGNOSIS — R7303 Prediabetes: Secondary | ICD-10-CM

## 2017-11-11 DIAGNOSIS — Z0000 Encounter for general adult medical examination without abnormal findings: Secondary | ICD-10-CM | POA: Diagnosis not present

## 2017-11-11 DIAGNOSIS — E7800 Pure hypercholesterolemia, unspecified: Secondary | ICD-10-CM

## 2017-11-11 LAB — COMPREHENSIVE METABOLIC PANEL
ALK PHOS: 98 U/L (ref 39–117)
ALT: 31 U/L (ref 0–35)
AST: 19 U/L (ref 0–37)
Albumin: 4.3 g/dL (ref 3.5–5.2)
BUN: 17 mg/dL (ref 6–23)
CO2: 31 meq/L (ref 19–32)
Calcium: 9.9 mg/dL (ref 8.4–10.5)
Chloride: 97 mEq/L (ref 96–112)
Creatinine, Ser: 0.71 mg/dL (ref 0.40–1.20)
GFR: 86.86 mL/min (ref >60.00)
GLUCOSE: 100 mg/dL — AB (ref 70–99)
POTASSIUM: 3.9 meq/L (ref 3.5–5.1)
SODIUM: 138 meq/L (ref 135–145)
TOTAL PROTEIN: 8.4 g/dL — AB (ref 6.0–8.3)
Total Bilirubin: 0.5 mg/dL (ref 0.2–1.2)

## 2017-11-11 LAB — MICROALBUMIN / CREATININE URINE RATIO
Creatinine,U: 79.2 mg/dL
MICROALB UR: 1.5 mg/dL (ref 0.0–1.9)
Microalb Creat Ratio: 2 mg/g (ref 0.0–30.0)

## 2017-11-11 LAB — LIPID PANEL
CHOL/HDL RATIO: 3
Cholesterol: 175 mg/dL (ref 0–200)
HDL: 63.8 mg/dL (ref >39.00)
LDL Cholesterol: 92 mg/dL (ref 0–99)
NONHDL: 110.99
Triglycerides: 96 mg/dL (ref 0.0–149.0)
VLDL: 19.2 mg/dL (ref 0.0–40.0)

## 2017-11-11 LAB — HEMOGLOBIN A1C: HEMOGLOBIN A1C: 6.6 % — AB (ref 4.6–6.5)

## 2017-11-11 NOTE — Patient Instructions (Signed)
Jennifer Pineda , Thank you for taking time to come for your Medicare Wellness Visit. I appreciate your ongoing commitment to your health goals. Please review the following plan we discussed and let me know if I can assist you in the future.   These are the goals we discussed: Goals    . Increase physical activity     Starting 11/11/2017, I will continue to walk for at least 30 minutes daily and to bowl twice weekly.       This is a list of the screening recommended for you and due dates:  Health Maintenance  Topic Date Due  . Mammogram  11/11/2018  . Urine Protein Check  11/12/2018  . Colon Cancer Screening  10/14/2022  . DTaP/Tdap/Td vaccine (2 - Td) 05/11/2024  . Tetanus Vaccine  05/11/2024  . Flu Shot  Completed  . DEXA scan (bone density measurement)  Completed  .  Hepatitis C: One time screening is recommended by Center for Disease Control  (CDC) for  adults born from [birth year] through 1967.   Completed  . Pneumonia vaccines  Completed   Preventive Care for Adults  A healthy lifestyle and preventive care can promote health and wellness. Preventive health guidelines for adults include the following key practices.  . A routine yearly physical is a good way to check with your health care provider about your health and preventive screening. It is a chance to share any concerns and updates on your health and to receive a thorough exam.  . Visit your dentist for a routine exam and preventive care every 6 months. Brush your teeth twice a day and floss once a day. Good oral hygiene prevents tooth decay and gum disease.  . The frequency of eye exams is based on your age, health, family medical history, use  of contact lenses, and other factors. Follow your health care provider's recommendations for frequency of eye exams.  . Eat a healthy diet. Foods like vegetables, fruits, whole grains, low-fat dairy products, and lean protein foods contain the nutrients you need without too many calories.  Decrease your intake of foods high in solid fats, added sugars, and salt. Eat the right amount of calories for you. Get information about a proper diet from your health care provider, if necessary.  . Regular physical exercise is one of the most important things you can do for your health. Most adults should get at least 150 minutes of moderate-intensity exercise (any activity that increases your heart rate and causes you to sweat) each week. In addition, most adults need muscle-strengthening exercises on 2 or more days a week.  Silver Sneakers may be a benefit available to you. To determine eligibility, you may visit the website: www.silversneakers.com or contact program at 623-215-4807 Mon-Fri between 8AM-8PM.   . Maintain a healthy weight. The body mass index (BMI) is a screening tool to identify possible weight problems. It provides an estimate of body fat based on height and weight. Your health care provider can find your BMI and can help you achieve or maintain a healthy weight.   For adults 20 years and older: ? A BMI below 18.5 is considered underweight. ? A BMI of 18.5 to 24.9 is normal. ? A BMI of 25 to 29.9 is considered overweight. ? A BMI of 30 and above is considered obese.   . Maintain normal blood lipids and cholesterol levels by exercising and minimizing your intake of saturated fat. Eat a balanced diet with plenty of fruit and  vegetables. Blood tests for lipids and cholesterol should begin at age 11 and be repeated every 5 years. If your lipid or cholesterol levels are high, you are over 50, or you are at high risk for heart disease, you may need your cholesterol levels checked more frequently. Ongoing high lipid and cholesterol levels should be treated with medicines if diet and exercise are not working.  . If you smoke, find out from your health care provider how to quit. If you do not use tobacco, please do not start.  . If you choose to drink alcohol, please do not consume  more than 2 drinks per day. One drink is considered to be 12 ounces (355 mL) of beer, 5 ounces (148 mL) of wine, or 1.5 ounces (44 mL) of liquor.  . If you are 47-41 years old, ask your health care provider if you should take aspirin to prevent strokes.  . Use sunscreen. Apply sunscreen liberally and repeatedly throughout the day. You should seek shade when your shadow is shorter than you. Protect yourself by wearing long sleeves, pants, a wide-brimmed hat, and sunglasses year round, whenever you are outdoors.  . Once a month, do a whole body skin exam, using a mirror to look at the skin on your back. Tell your health care provider of new moles, moles that have irregular borders, moles that are larger than a pencil eraser, or moles that have changed in shape or color.

## 2017-11-11 NOTE — Progress Notes (Signed)
PCP notes:   Health maintenance:  Microalbumin - completed  Abnormal screenings:   None  Patient concerns:   Pt has requested a letter be written signed by PCP ating she needs a new CPAP machine and supplies for one year.   Nurse concerns:  None  Next PCP appt:   11/18/17 @ 1415

## 2017-11-11 NOTE — Progress Notes (Signed)
Subjective:   Jennifer Pineda is a 67 y.o. female who presents for Medicare Annual (Subsequent) preventive examination.  Review of Systems:  N/A Cardiac Risk Factors include: advanced age (>59mn, >>28women);diabetes mellitus;dyslipidemia;hypertension;obesity (BMI >30kg/m2)     Objective:     Vitals: BP (!) 148/70 (BP Location: Right Arm, Patient Position: Sitting, Cuff Size: Normal)   Pulse (!) 53   Temp 97.8 F (36.6 C) (Oral)   Ht 5' 3.5" (1.613 m) Comment: no shoes  Wt 187 lb (84.8 kg)   SpO2 96%   BMI 32.61 kg/m   Body mass index is 32.61 kg/m.  Advanced Directives 11/11/2017 09/30/2016 10/05/2015 08/15/2015 07/11/2015  Does Patient Have a Medical Advance Directive? _0   Type of AParamedicof [place]Living will [place] will Living will;Healthcare Power of [place]Living will  Does patient want to make changes to medical advance directive? - - No - Patient declined No - Patient declined No - Patient declined  Copy of HBox [place]in Chart? Yes No - copy requested - No - copy requested No - copy requested    Tobacco Social History   Tobacco Use  Smoking Status Former Smoker  . Packs/day: 2.00  . Years: 19.00  . Pack years: 38.00  . Types: Cigarettes  . Last attempt to quit: 08/26/1986  . Years since quitting: 31.2  Smokeless Tobacco Never Used     Counseling given: No   Clinical Intake:  Pre-visit preparation completed: Yes  Pain : No/denies pain Pain Score: 0-No pain     Nutritional Status: BMI > 30  Obese Nutritional Risks: None Diabetes: No  How often do you need to have someone help you when you read instructions, pamphlets, or other written materials from your doctor or pharmacy?: 1 - Never What is the last grade level you completed in school?: Associate degrees (two)  Interpreter Needed?: No  Comments: pt  and daughter live together Information entered by :: LPinson, LPN  Past Medical History:  Diagnosis Date  . Allergic rhinitis   . Anxiety and depression   . Arthritis    hands  . Asthma, persistent   . CAD (coronary artery disease)    per CT 06-02-2015  . Complication of anesthesia    slow to wake  . [place]    pulmologist--  dr vVilinda Boehringer([facility] [place]  . Diet-controlled diabetes mellitus ([place] 05/01/2011   Completed DSME 10/2015   . Diverticulosis of colon   . Essential hypertension   . GERD (gastroesophageal reflux disease)   . History of cellulitis    Nov 2015-- nasal cellulitis--  resolved  . History of endometriosis   . History of hiatal hernia   . Hyperlipidemia   . OSA on CPAP    severe per study 05-09-2010  . PONV (postoperative nausea and vomiting)   . Shingles 12/29/2006  . Sleep apnea   . [place]   superficial papillary L lateral bladder wall s/p BCG therapy ([place]   Past Surgical History:  Procedure Laterality Date  . ABDOMINAL HYSTERECTOMY  1983   and Appendectomy  . CARDIOVASCULAR STRESS TEST  10-06-1998   no evidence ischemia or scar/  normal LV function and wall motion, ef 65%  . COLONOSCOPY  last one 02/ 2014  . CYSTOSCOPY WITH BIOPSY N/A 08/15/2015   Procedure: CYSTOSCOPY WITH BLADDER RESECTION;  Surgeon:  Festus Aloe, MD;  Location: [facility];  Service: Urology;  Laterality: N/A;  . ESOPHAGOGASTRODUODENOSCOPY  last one 08/ 2001  . LUMBAR LAMINECTOMY/DECOMPRESSION MICRODISCECTOMY  10/ 2013  . SEPTOPLASTY  03/  2002  . TRANSURETHRAL RESECTION OF BLADDER TUMOR N/A 07/11/2015   Procedure: TRANSURETHRAL RESECTION OF BLADDER TUMOR (TURBT);  Surgeon: Festus Aloe, MD;  Location: [facility];  Service: Urology;  Laterality: N/A;  . TRANSURETHRAL RESECTION OF BLADDER TUMOR N/A 08/15/2015   Procedure: TRANSURETHRAL RESECTION OF BLADDER TUMOR (TURBT);  Surgeon:  Festus Aloe, MD;  Location: [facility];  Service: Urology;  Laterality: N/A;  . TRIGGER FINGER RELEASE Right 2014   Family History  Problem Relation Age of Onset  . Cancer Mother        kidney and lymphoma CA, neprectomy tumor of lung, non smoker  . Kidney disease Mother        T-cell cancer in kidney  . Aneurysm Father        ? in throat  . Diabetes Father   . Heart disease Father        MI  . Diabetes Sister   . Hyperlipidemia Sister   . Heart disease Brother 82       MI  . Alcohol abuse Brother        smokes again  . Hypertension Brother   . Cancer Brother 6       T-cell renal cancer/ removed  . Hypertension Brother   . Heart disease Maternal Aunt        MI  . Colon cancer Neg Hx   . Esophageal cancer Neg Hx   . Rectal cancer Neg Hx   . Stomach cancer Neg Hx   . Breast cancer Neg Hx    Social History   Socioeconomic History  . Marital status: Divorced    Spouse name: None  . Number of children: 2  . Years of education: None  . Highest education level: None  Social Needs  . Financial resource strain: None  . Food insecurity - worry: None  . Food insecurity - inability: None  . Transportation needs - medical: None  . Transportation needs - non-medical: None  Occupational History  . Occupation: Event organiser: [employer]    Comment: [employer]  Tobacco Use  . Smoking status: Former Smoker    Packs/day: 2.00    Years: 19.00    Pack years: 38.00    Types: Cigarettes    Last attempt to quit: 08/26/1986    Years since quitting: 31.2  . Smokeless tobacco: Never Used  Substance and Sexual Activity  . Alcohol use: Yes    Comment: rarely  . Drug use: No  . Sexual activity: No  Other Topics Concern  . None  Social History Narrative   Divorced   Daughter [name], lives with her   Children: 2 children // one daughter lives with mother/ son lives with father   Activity: 20 min walk daily   Diet: good water,  fruits/vegetables daily.     Outpatient Encounter Medications as of 11/11/2017  Medication Sig  . albuterol (PROVENTIL HFA;VENTOLIN HFA) 108 (90 BASE) MCG/ACT inhaler Inhale 2 puffs into the lungs every 6 (six) hours as needed. For shortness of breath.  Marland Kitchen amLODipine (NORVASC) 10 MG tablet TAKE 1 TABLET BY MOUTH ONCE DAILY  . atorvastatin (LIPITOR) 10 MG tablet TAKE 1 TABLET BY MOUTH ONCE DAILY  . Blood  Glucose Monitoring Suppl (ONE TOUCH ULTRA SYSTEM KIT) w/Device KIT 1 kit by Does not apply route once.  Marland Kitchen BREO ELLIPTA 100-25 MCG/INH AEPB INHALE 1 PUFF BY MOUTH ONCE DAILY  . EQ ALLERGY RELIEF 10 MG tablet TAKE ONE TABLET BY MOUTH ONCE DAILY  . FLUoxetine (PROZAC) 10 MG capsule TAKE 1 CAPSULE BY MOUTH ONCE DAILY  . fluticasone (FLONASE) 50 MCG/ACT nasal spray USE 2 SPRAY(S) IN EACH NOSTRIL ONCE DAILY  . glucose blood (ONE TOUCH ULTRA TEST) test strip 1 each by Other route daily. Use to check sugar once daily and as needed. Dx: E11.9  . glucose blood test strip Use as instructed, One Touch Ultra Blue  . guaifenesin (HUMIBID E) 400 MG TABS Take 400 mg by mouth every morning.   Marland Kitchen HYDROcodone-homatropine (HYCODAN) 5-1.5 MG/5ML syrup Take 5 mLs by mouth every 8 (eight) hours as needed for cough.  . Multiple Vitamins-Calcium (VIACTIV MULTI-VITAMIN) CHEW Chew 1 tablet by mouth daily.  Marland Kitchen omeprazole (PRILOSEC) 40 MG capsule TAKE ONE CAPSULE BY MOUTH ONCE DAILY IN THE MORNING -  1  MINUTES  BEFORE  BREAKFAST  . ONETOUCH DELICA LANCETS FINE MISC Use as directed - check as needed or 3 times weekly  . tiotropium (SPIRIVA HANDIHALER) 18 MCG inhalation capsule Place 1 capsule (18 mcg total) into inhaler and inhale at bedtime.  . triamterene-hydrochlorothiazide (MAXZIDE-25) 37.5-25 MG tablet TAKE 1 TABLET BY MOUTH ONCE DAILY   No facility-administered encounter medications on file as of 11/11/2017.     Activities of Daily Living In your present state of health, do you have any difficulty performing the  following activities: 11/11/2017  Hearing? N  Vision? N  Difficulty concentrating or making decisions? N  Walking or climbing stairs? N  Dressing or bathing? N  Doing errands, shopping? N  Preparing Food and eating ? N  Using the Toilet? N  In the past six months, have you accidently leaked urine? Y  Do you have problems with loss of bowel control? N  Managing your Medications? N  Managing your Finances? N  Housekeeping or managing your Housekeeping? N  Some recent data might be hidden    Patient Care Team: Ria Bush, MD as PCP - General (Family Medicine) Arelia Sneddon, Haakon as Consulting Physician (Optometry) Festus Aloe, MD as Consulting Physician (Urology) Beverly Gust, MD as Consulting Physician (Otolaryngology) Vilinda Boehringer, MD (Inactive) as Consulting Physician (Internal Medicine) Meuth, Hadassah Pais as Physician Assistant (Physician Assistant)    Assessment:   This is a routine wellness examination for Airel.   Hearing Screening   _0  _1  _2  _3  _4  _5  _6  _7  _8   Right ear:   40 40 40  40    Left ear:   40 40 40  40    Vision Screening Comments: Last vision exam in 2018     Exercise Activities and Dietary recommendations Current Exercise Habits: Home exercise routine, Type of exercise: walking, Time (Minutes): 30, Frequency (Times/Week): 7, Weekly Exercise (Minutes/Week): 210, Intensity: Moderate, Exercise limited by: None identified  Goals    . Increase physical activity     Starting 11/11/2017, I will continue to walk for at least 30 minutes daily and to bowl twice weekly.       Fall Risk Fall Risk  11/11/2017 09/30/2016 10/30/2015 10/23/2015 10/16/2015  Falls in the past year? No No No (No Data) No  Comment - - - no falls since last visit -    Depression Screen PHQ 2/9 Scores 11/11/2017  09/30/2016 10/05/2015 09/08/2015  PHQ - 2 Score 0 0 0 0  PHQ- 9 Score 0 - - -     Cognitive Function MMSE - Mini Mental State  Exam 11/11/2017 09/30/2016  Orientation to time 5 5  Orientation to Place 5 5  Registration 3 3  Attention/ Calculation 0 0  Recall 3 3  Language- name 2 objects 0 0  Language- repeat 1 1  Language- follow 3 step command 3 3  Language- read & follow direction 0 0  Write a sentence 0 0  Copy design 0 0  Total score 20 20     PLEASE NOTE: A Mini-Cog screen was completed. Maximum score is 20. A value of 0 denotes this part of [place] MMSE was not completed or the patient failed this part of the Mini-Cog screening.   Mini-Cog Screening Orientation to Time - Max 5 pts Orientation to Place - Max 5 pts Registration - Max 3 pts Recall - Max 3 pts Language Repeat - Max 1 pts Language Follow 3 Step Command - Max 3 pts  Immunization History  Administered Date(s) Administered  . Influenza, High Dose Seasonal PF 07/07/2017  . Influenza,inj,Quad PF,6+ Mos 09/08/2015, 10/07/2016  . Pneumococcal Conjugate-13 05/11/2014  . Pneumococcal Polysaccharide-23 09/08/2015  . Td 02/11/2004  . Tdap 05/11/2014  . Zoster 06/08/2014   Screening Tests Health Maintenance  Topic Date Due  . MAMMOGRAM  11/11/2018  . URINE MICROALBUMIN  11/12/2018  . COLONOSCOPY  10/14/2022  . DTaP/Tdap/Td (2 - Td) 05/11/2024  . TETANUS/TDAP  05/11/2024  . INFLUENZA VACCINE  Completed  . DEXA SCAN  Completed  . Hepatitis C Screening  Completed  . PNA vac Low Risk Adult  Completed       Plan:     I have personally reviewed, addressed, and noted the following in the patient's chart:  A. Medical and social history B. Use of alcohol, tobacco or illicit drugs  C. Current medications and supplements D. Functional ability and status E.  Nutritional status F.  Physical activity G. Advance directives H. List of other physicians I.  Hospitalizations, surgeries, and ER visits in previous 12 months J.  [place] to include hearing, vision, cognitive, depression L. Referrals and appointments - none  In  addition, I have reviewed and discussed with patient certain preventive protocols, quality metrics, and best practice recommendations. A written personalized care plan for preventive services as well as general preventive health recommendations were provided to patient.  See attached scanned questionnaire for additional information.   Signed,   Lindell Noe, MHA, BS, LPN Health Coach

## 2017-11-12 ENCOUNTER — Other Ambulatory Visit: Payer: Self-pay | Admitting: Family Medicine

## 2017-11-13 DIAGNOSIS — S62616A Displaced fracture of proximal phalanx of right little finger, initial encounter for closed fracture: Secondary | ICD-10-CM | POA: Diagnosis not present

## 2017-11-15 NOTE — Progress Notes (Signed)
I reviewed health advisor's note, was available for consultation, and agree with documentation and plan.  

## 2017-11-18 ENCOUNTER — Ambulatory Visit (INDEPENDENT_AMBULATORY_CARE_PROVIDER_SITE_OTHER): Payer: PPO | Admitting: Family Medicine

## 2017-11-18 ENCOUNTER — Encounter: Payer: Self-pay | Admitting: Family Medicine

## 2017-11-18 VITALS — BP 128/68 | HR 63 | Temp 98.2°F | Ht 64.0 in | Wt 189.0 lb

## 2017-11-18 DIAGNOSIS — Z0000 Encounter for general adult medical examination without abnormal findings: Secondary | ICD-10-CM

## 2017-11-18 DIAGNOSIS — E119 Type 2 diabetes mellitus without complications: Secondary | ICD-10-CM

## 2017-11-18 DIAGNOSIS — G4733 Obstructive sleep apnea (adult) (pediatric): Secondary | ICD-10-CM | POA: Diagnosis not present

## 2017-11-18 DIAGNOSIS — Z7189 Other specified counseling: Secondary | ICD-10-CM | POA: Diagnosis not present

## 2017-11-18 DIAGNOSIS — E7800 Pure hypercholesterolemia, unspecified: Secondary | ICD-10-CM

## 2017-11-18 DIAGNOSIS — J4530 Mild persistent asthma, uncomplicated: Secondary | ICD-10-CM

## 2017-11-18 DIAGNOSIS — I10 Essential (primary) hypertension: Secondary | ICD-10-CM

## 2017-11-18 MED ORDER — FLUTICASONE PROPIONATE 50 MCG/ACT NA SUSP: 2.0000 | Freq: Every day | NASAL | 3 refills | Status: DC

## 2017-11-18 MED ORDER — FLUOXETINE HCL 10 MG PO CAPS: 10.0000 mg | ORAL_CAPSULE | Freq: Every day | ORAL | 3 refills | Status: DC

## 2017-11-18 MED ORDER — ATORVASTATIN CALCIUM 10 MG PO TABS: 10.0000 mg | ORAL_TABLET | Freq: Every day | ORAL | 3 refills | Status: DC

## 2017-11-18 MED ORDER — ALBUTEROL SULFATE HFA 108 (90 BASE) MCG/ACT IN AERS: 2.0000 | INHALATION_SPRAY | Freq: Four times a day (QID) | RESPIRATORY_TRACT | 3 refills | Status: DC | PRN

## 2017-11-18 MED ORDER — OMEPRAZOLE 40 MG PO CPDR: DELAYED_RELEASE_CAPSULE | ORAL | 3 refills | Status: DC

## 2017-11-18 MED ORDER — TRIAMTERENE-HCTZ 37.5-25 MG PO TABS: 1.0000 | ORAL_TABLET | Freq: Every day | ORAL | 3 refills | Status: DC

## 2017-11-18 MED ORDER — FLUTICASONE FUROATE-VILANTEROL 100-25 MCG/INH IN AEPB: 1.0000 | INHALATION_SPRAY | Freq: Every day | RESPIRATORY_TRACT | 3 refills | Status: DC

## 2017-11-18 MED ORDER — AMLODIPINE BESYLATE 10 MG PO TABS: 10.0000 mg | ORAL_TABLET | Freq: Every day | ORAL | 3 refills | Status: DC

## 2017-11-18 NOTE — Assessment & Plan Note (Signed)
Chronic, stable. Continue current regimen. 

## 2017-11-18 NOTE — Assessment & Plan Note (Signed)
Chronic, stable on breo, spiriva and albuterol PRN.

## 2017-11-18 NOTE — Assessment & Plan Note (Signed)
Chronic, mildly deteriorated but still well controlled.  Discussed healthy diet and lifestyle.  RTC 6 mo DM f/u visit.

## 2017-11-18 NOTE — Assessment & Plan Note (Signed)
Chronic, stable on lipitor.  The 10-year ASCVD risk score Mikey Bussing DC Brooke Bonito., et al., 2013) is: 17.3%   Values used to calculate the score:     Age: 67 years     Sex: Female     Is Non-Hispanic African American: No     Diabetic: Yes     Tobacco smoker: No     Systolic Blood Pressure: 076 mmHg     Is BP treated: Yes     HDL Cholesterol: 63.8 mg/dL     Total Cholesterol: 175 mg/dL

## 2017-11-18 NOTE — Assessment & Plan Note (Signed)
Reports compliance. Last saw sleep doctor 2016. Requests Rx for new machine as current one has broken top. Rx written and she will take to [facility]. Discussed may need to return to pulm.

## 2017-11-18 NOTE — Progress Notes (Signed)
BP 128/68 (BP Location: Left Arm, Patient Position: Sitting, Cuff Size: Normal)   Pulse 63   Temp 98.2 F (36.8 C) (Oral)   Ht _0  (1.626 m)   Wt 189 lb (85.7 kg)   SpO2 97%   BMI 32.44 kg/m    CC: CPE Subjective:    Patient ID: Jennifer Pineda, female    DOB: October 19, 1950, 67 y.o.   MRN: [id number]  HPI: Jennifer Pineda is a 67 y.o. female presenting on 11/18/2017 for Annual Exam (Pt 2. Requests new CPAP and supplies with [facility].  Requests 90 day rxs for all meds.)   Recent displaced fracture of proximal phalanx of R little finger - [facility] with conservative care and improving.   Top of CPAP machine broke. Requests letter saying she needs new CPAP machine.   Preventative: COLONOSCOPY Date: 09/2012 small int hem Carlean Purl) rpt 10 yrs Last pap was 2010, all normal. Hysterectomy age 68yo. Ovaries remain.  Mammogram 10/2017 WNL.  DEXA 06/2014 - WNL Flu - yearly prevnar 2015, pneumovax 08/2015 Td 2005, Tdap 2015 zostavax - 2015 shingrix - discussed Advanced directives - in chart 08/2015. Would want son [name]) to be HCPOA. No prolonged life support if terminal condition. Full code, ok with temporary ventilation.  Seat belt use discussed Sunscreen use discussed. No changing moles on skin.  Ex smoker - quit 1988 Alcohol - a few drinks a week  Divorced Daughter [name], lives with her Children: 2 children // one daughter lives with mother/ son lives with father Activity: 20 min walk daily Diet: good water, fruits/vegetables daily.   Relevant past medical, surgical, family and social history reviewed and updated as indicated. Interim medical history since our last visit reviewed. Allergies and medications reviewed and updated. Outpatient Medications Prior to Visit  Medication Sig Dispense Refill  . Blood Glucose Monitoring Suppl (ONE TOUCH ULTRA SYSTEM KIT) w/Device KIT 1 kit by Does not apply route once. 1 each 0  . EQ ALLERGY RELIEF 10 MG  tablet TAKE ONE TABLET BY MOUTH ONCE DAILY 30 tablet 11  . glucose blood (ONE TOUCH ULTRA TEST) test strip 1 each by Other route daily. Use to check sugar once daily and as needed. Dx: E11.9 100 each 3  . HYDROcodone-homatropine (HYCODAN) 5-1.5 MG/5ML syrup Take 5 mLs by mouth every 8 (eight) hours as needed for cough. 120 mL 0  . Multiple Vitamins-Calcium (VIACTIV MULTI-VITAMIN) CHEW Chew 1 tablet by mouth daily.    Glory Rosebush DELICA LANCETS FINE MISC Use as directed - check as needed or 3 times weekly 100 each 1  . tiotropium (SPIRIVA HANDIHALER) 18 MCG inhalation capsule Place 1 capsule (18 mcg total) into inhaler and inhale at bedtime. 30 capsule 3  . albuterol (PROVENTIL HFA;VENTOLIN HFA) 108 (90 BASE) MCG/ACT inhaler Inhale 2 puffs into the lungs every 6 (six) hours as needed. For shortness of breath. 54 Inhaler 3  . amLODipine (NORVASC) 10 MG tablet TAKE 1 TABLET BY MOUTH ONCE DAILY 90 tablet 0  . atorvastatin (LIPITOR) 10 MG tablet TAKE 1 TABLET BY MOUTH ONCE DAILY 90 tablet 0  . BREO ELLIPTA 100-25 MCG/INH AEPB INHALE 1 PUFF BY MOUTH ONCE DAILY 180 each 1  . FLUoxetine (PROZAC) 10 MG capsule TAKE 1 CAPSULE BY MOUTH ONCE DAILY 90 capsule 0  . fluticasone (FLONASE) 50 MCG/ACT nasal spray USE 2 SPRAY(S) IN EACH NOSTRIL ONCE DAILY 48 g 1  . glucose blood test strip Use as instructed, One  Touch Ultra Blue 100 each 1  . guaifenesin (HUMIBID E) 400 MG TABS Take 400 mg by mouth every morning.     Marland Kitchen omeprazole (PRILOSEC) 40 MG capsule TAKE 1 CAPSULE BY MOUTH ONCE DAILY IN THE MORNING -  45  MINUTES  BEFORE  BREAKFAST 90 capsule 3  . triamterene-hydrochlorothiazide (MAXZIDE-25) 37.5-25 MG tablet TAKE 1 TABLET BY MOUTH ONCE DAILY 90 tablet 0   No facility-administered medications prior to visit.      Per HPI unless specifically indicated in ROS section below Review of Systems  Constitutional: Negative for activity change, appetite change, chills, fatigue, fever and unexpected weight change.    HENT: Negative for hearing loss.   Eyes: Negative for visual disturbance.  Respiratory: Positive for cough and shortness of breath. Negative for chest tightness and wheezing.   Cardiovascular: Negative for chest pain, palpitations and leg swelling.  Gastrointestinal: Negative for abdominal distention, abdominal pain, blood in stool, constipation, diarrhea, nausea and vomiting.  Genitourinary: Negative for difficulty urinating and hematuria.  Musculoskeletal: Negative for arthralgias, myalgias and neck pain.  Skin: Negative for rash.  Neurological: Negative for dizziness, seizures, syncope and headaches.  Hematological: Negative for adenopathy. Does not bruise/bleed easily.  Psychiatric/Behavioral: Negative for dysphoric mood. The patient is not nervous/anxious.        Objective:    BP 128/68 (BP Location: Left Arm, Patient Position: Sitting, Cuff Size: Normal)   Pulse 63   Temp 98.2 F (36.8 C) (Oral)   Ht _0  (1.626 m)   Wt 189 lb (85.7 kg)   SpO2 97%   BMI 32.44 kg/m   Wt Readings from Last 3 Encounters:  11/18/17 189 lb (85.7 kg)  11/11/17 187 lb (84.8 kg)  11/06/17 190 lb (86.2 kg)    Physical Exam  Constitutional: She is oriented to person, place, and time. She appears well-developed and well-nourished. No distress.  HENT:  Head: Normocephalic and atraumatic.  Right Ear: Hearing, tympanic membrane, external ear and ear canal normal.  Left Ear: Hearing, tympanic membrane, external ear and ear canal normal.  Nose: Nose normal.  Mouth/Throat: Uvula is midline, oropharynx is clear and moist and mucous membranes are normal. No oropharyngeal exudate, posterior oropharyngeal edema or posterior oropharyngeal erythema.  Eyes: Pupils are equal, round, and reactive to light. Conjunctivae and EOM are normal. No scleral icterus.  Neck: Normal range of motion. Neck supple. No thyromegaly present.  Cardiovascular: Normal rate, regular rhythm, normal heart sounds and intact distal  pulses.  No murmur heard. Pulses:      Radial pulses are 2+ on the right side, and 2+ on the left side.  Pulmonary/Chest: Effort normal and breath sounds normal. No respiratory distress. She has no wheezes. She has no rales.  Abdominal: Soft. Bowel sounds are normal. She exhibits no distension and no mass. There is no tenderness. There is no rebound and no guarding.  Musculoskeletal: Normal range of motion. She exhibits no edema.  Lymphadenopathy:    She has no cervical adenopathy.  Neurological: She is alert and oriented to person, place, and time.  CN grossly intact, station and gait intact  Skin: Skin is warm and dry. No rash noted.  Psychiatric: She has a normal mood and affect. Her behavior is normal. Judgment and thought content normal.  Nursing note and vitals reviewed.  Results for orders placed or performed in visit on 11/11/17  Hemoglobin A1c  Result Value Ref Range   Hgb A1c MFr Bld 6.6 (H) 4.6 - 6.5 %  Comprehensive metabolic panel  Result Value Ref Range   Sodium 138 135 - 145 mEq/L   Potassium 3.9 3.5 - 5.1 mEq/L   Chloride 97 96 - 112 mEq/L   CO2 31 19 - 32 mEq/L   Glucose, Bld 100 (H) 70 - 99 mg/dL   BUN 17 6 - 23 mg/dL   Creatinine, Ser 0.71 0.40 - 1.20 mg/dL   Total Bilirubin 0.5 0.2 - 1.2 mg/dL   Alkaline Phosphatase 98 39 - 117 U/L   AST 19 0 - 37 U/L   ALT 31 0 - 35 U/L   Total Protein 8.4 (H) 6.0 - 8.3 g/dL   Albumin 4.3 3.5 - 5.2 g/dL   Calcium 9.9 8.4 - 10.5 mg/dL   GFR 86.86 >60.00 mL/min  Lipid panel  Result Value Ref Range   Cholesterol 175 0 - 200 mg/dL   Triglycerides 96.0 0.0 - 149.0 mg/dL   HDL 63.80 >39.00 mg/dL   VLDL 19.2 0.0 - 40.0 mg/dL   LDL Cholesterol 92 0 - 99 mg/dL   Total CHOL/HDL Ratio 3    NonHDL 110.99   Microalbumin/Creatinine Ratio, Urine  Result Value Ref Range   Microalb, Ur 1.5 0.0 - 1.9 mg/dL   Creatinine,U 79.2 mg/dL   Microalb Creat Ratio 2.0 0.0 - 30.0 mg/g      Assessment & Plan:   Problem List Items  Addressed This Visit    Advanced care planning/counseling discussion    Advanced directives - in chart 08/2015. Would want son [name]) to be HCPOA. No prolonged life support if terminal condition. Full code, ok with temporary ventilation.       Controlled type 2 diabetes mellitus without complication, without long-term current use of insulin (HCC)    Chronic, mildly deteriorated but still well controlled.  Discussed healthy diet and lifestyle.  RTC 6 mo DM f/u visit.       Relevant Medications   atorvastatin (LIPITOR) 10 MG tablet   Essential hypertension    Chronic, stable. Continue current regimen.       Relevant Medications   amLODipine (NORVASC) 10 MG tablet   atorvastatin (LIPITOR) 10 MG tablet   triamterene-hydrochlorothiazide (MAXZIDE-25) 37.5-25 MG tablet   Healthcare maintenance - Primary    Preventative protocols reviewed and updated unless pt declined. Discussed healthy diet and lifestyle.       HYPERCHOLESTEROLEMIA    Chronic, stable on lipitor.  The 10-year ASCVD risk score Mikey Bussing DC Brooke Bonito., et al., 2013) is: 17.3%   Values used to calculate the score:     Age: 39 years     Sex: Female     Is Non-Hispanic African American: No     Diabetic: Yes     Tobacco smoker: No     Systolic Blood Pressure: 563 mmHg     Is BP treated: Yes     HDL Cholesterol: 63.8 mg/dL     Total Cholesterol: 175 mg/dL       Relevant Medications   amLODipine (NORVASC) 10 MG tablet   atorvastatin (LIPITOR) 10 MG tablet   triamterene-hydrochlorothiazide (MAXZIDE-25) 37.5-25 MG tablet   Obstructive sleep apnea    Reports compliance. Last saw sleep doctor 2016. Requests Rx for new machine as current one has broken top. Rx written and she will [facility]. Discussed may need to return to pulm.       Persistent asthma without complication    Chronic, stable on breo, spiriva and albuterol PRN.  Relevant Medications   albuterol (PROVENTIL HFA;VENTOLIN HFA) 108 (90  Base) MCG/ACT inhaler   fluticasone furoate-vilanterol (BREO ELLIPTA) 100-25 MCG/INH AEPB       Meds ordered this encounter  Medications  . albuterol (PROVENTIL HFA;VENTOLIN HFA) 108 (90 Base) MCG/ACT inhaler    Sig: Inhale 2 puffs into the lungs every 6 (six) hours as needed. For shortness of breath.    Dispense:  54 Inhaler    Refill:  3  . amLODipine (NORVASC) 10 MG tablet    Sig: Take 1 tablet (10 mg total) by mouth daily.    Dispense:  90 tablet    Refill:  3  . atorvastatin (LIPITOR) 10 MG tablet    Sig: Take 1 tablet (10 mg total) by mouth daily.    Dispense:  90 tablet    Refill:  3  . fluticasone furoate-vilanterol (BREO ELLIPTA) 100-25 MCG/INH AEPB    Sig: Inhale 1 puff into the lungs daily.    Dispense:  90 each    Refill:  3    Please consider 90 day supplies to promote better adherence  . FLUoxetine (PROZAC) 10 MG capsule    Sig: Take 1 capsule (10 mg total) by mouth daily.    Dispense:  90 capsule    Refill:  3  . fluticasone (FLONASE) 50 MCG/ACT nasal spray    Sig: Place 2 sprays into both nostrils daily.    Dispense:  48 g    Refill:  3  . omeprazole (PRILOSEC) 40 MG capsule    Sig: Take one capsule daily    Dispense:  90 capsule    Refill:  3  . triamterene-hydrochlorothiazide (MAXZIDE-25) 37.5-25 MG tablet    Sig: Take 1 tablet by mouth daily.    Dispense:  90 tablet    Refill:  3   No orders of the defined types were placed in this encounter.   Follow up plan: Return in about 6 months (around 05/21/2018) for follow up visit.  Ria Bush, MD

## 2017-11-18 NOTE — Assessment & Plan Note (Addendum)
Advanced directives - in chart 08/2015. Would want son [name]) to be HCPOA. No prolonged life support if terminal condition. Full code, ok with temporary ventilation.

## 2017-11-18 NOTE — Patient Instructions (Addendum)
If interested, check with pharmacy about new 2 shot shingles series (shingrix).  Rx for CPAP machine written out today.  You are doing well today. Return as needed or in 6 months for diabetes follow up.  Health Maintenance, Female Adopting a healthy lifestyle and getting preventive care can go a long way to promote health and wellness. Talk with your health care provider about what schedule of regular examinations is right for you. This is a good chance for you to check in with your provider about disease prevention and staying healthy. In between checkups, there are plenty of things you can do on your own. Experts have done a lot of research about which lifestyle changes and preventive measures are most likely to keep you healthy. Ask your health care provider for more information. Weight and diet Eat a healthy diet  Be sure to include plenty of vegetables, fruits, low-fat dairy products, and lean protein.  Do not eat a lot of foods high in solid fats, added sugars, or salt.  Get regular exercise. This is one of the most important things you can do for your health. ? Most adults should exercise for at least 150 minutes each week. The exercise should increase your heart rate and make you sweat (moderate-intensity exercise). ? Most adults should also do strengthening exercises at least twice a week. This is in addition to the moderate-intensity exercise.  Maintain a healthy weight  Body mass index (BMI) is a measurement that can be used to identify possible weight problems. It estimates body fat based on height and weight. Your health care provider can help determine your BMI and help you achieve or maintain a healthy weight.  For females 38 years of age and older: ? A BMI below 18.5 is considered underweight. ? A BMI of 18.5 to 24.9 is normal. ? A BMI of 25 to 29.9 is considered overweight. ? A BMI of 30 and above is considered obese.  Watch levels of cholesterol and blood lipids  You  should start having your blood tested for lipids and cholesterol at 69 years of age, then have this test every 5 years.  You may need to have your cholesterol levels checked more often if: ? Your lipid or cholesterol levels are high. ? You are older than 69 years of age. ? You are at high risk for heart disease.  Cancer screening Lung Cancer  Lung cancer screening is recommended for adults 15-49 years old who are at high risk for lung cancer because of a history of smoking.  A yearly low-dose CT scan of the lungs is recommended for people who: ? Currently smoke. ? Have quit within the past 15 years. ? Have at least a 30-pack-year history of smoking. A pack year is smoking an average of one pack of cigarettes a day for 1 year.  Yearly screening should continue until it has been 15 years since you quit.  Yearly screening should stop if you develop a health problem that would prevent you from having lung cancer treatment.  Breast Cancer  Practice breast self-awareness. This means understanding how your breasts normally appear and feel.  It also means doing regular breast self-exams. Let your health care provider know about any changes, no matter how small.  If you are in your 20s or 30s, you should have a clinical breast exam (CBE) by a health care provider every 1-3 years as part of a regular health exam.  If you are 70 or older, have a  CBE every year. Also consider having a breast X-ray (mammogram) every year.  If you have a family history of breast cancer, talk to your health care provider about genetic screening.  If you are at high risk for breast cancer, talk to your health care provider about having an MRI and a mammogram every year.  Breast cancer gene (BRCA) assessment is recommended for women who have family members with BRCA-related cancers. BRCA-related cancers include: ? Breast. ? Ovarian. ? Tubal. ? Peritoneal cancers.  Results of the assessment will determine the  need for genetic counseling and BRCA1 and BRCA2 testing.  Cervical Cancer Your health care provider may recommend that you be screened regularly for cancer of the pelvic organs (ovaries, uterus, and vagina). This screening involves a pelvic examination, including checking for microscopic changes to the surface of your cervix (Pap test). You may be encouraged to have this screening done every 3 years, beginning at age 31.  For women ages 4-65, health care providers may recommend pelvic exams and Pap testing every 3 years, or they may recommend the Pap and pelvic exam, combined with testing for human papilloma virus (HPV), every 5 years. Some types of HPV increase your risk of cervical cancer. Testing for HPV may also be done on women of any age with unclear Pap test results.  Other health care providers may not recommend any screening for nonpregnant women who are considered low risk for pelvic cancer and who do not have symptoms. Ask your health care provider if a screening pelvic exam is right for you.  If you have had past treatment for cervical cancer or a condition that could lead to cancer, you need Pap tests and screening for cancer for at least 20 years after your treatment. If Pap tests have been discontinued, your risk factors (such as having a new sexual partner) need to be reassessed to determine if screening should resume. Some women have medical problems that increase the chance of getting cervical cancer. In these cases, your health care provider may recommend more frequent screening and Pap tests.  Colorectal Cancer  This type of cancer can be detected and often prevented.  Routine colorectal cancer screening usually begins at 69 years of age and continues through 69 years of age.  Your health care provider may recommend screening at an earlier age if you have risk factors for colon cancer.  Your health care provider may also recommend using home test kits to check for hidden blood  in the stool.  A small camera at the end of a tube can be used to examine your colon directly (sigmoidoscopy or colonoscopy). This is done to check for the earliest forms of colorectal cancer.  Routine screening usually begins at age 67.  Direct examination of the colon should be repeated every 5-10 years through 69 years of age. However, you may need to be screened more often if early forms of precancerous polyps or small growths are found.  Skin Cancer  Check your skin from head to toe regularly.  Tell your health care provider about any new moles or changes in moles, especially if there is a change in a mole's shape or color.  Also tell your health care provider if you have a mole that is larger than the size of a pencil eraser.  Always use sunscreen. Apply sunscreen liberally and repeatedly throughout the day.  Protect yourself by wearing long sleeves, pants, a wide-brimmed hat, and sunglasses whenever you are outside.  Heart disease,  diabetes, and high blood pressure  High blood pressure causes heart disease and increases the risk of stroke. High blood pressure is more likely to develop in: ? People who have blood pressure in the high end of the normal range (130-139/85-89 mm Hg). ? People who are overweight or obese. ? People who are African American.  If you are 35-41 years of age, have your blood pressure checked every 3-5 years. If you are 68 years of age or older, have your blood pressure checked every year. You should have your blood pressure measured twice-once when you are at a hospital or clinic, and once when you are not at a hospital or clinic. Record the average of the two measurements. To check your blood pressure when you are not at a hospital or clinic, you can use: ? An automated blood pressure machine at a pharmacy. ? A home blood pressure monitor.  If you are between 62 years and 22 years old, ask your health care provider if you should take aspirin to prevent  strokes.  Have regular diabetes screenings. This involves taking a blood sample to check your fasting blood sugar level. ? If you are at a normal weight and have a low risk for diabetes, have this test once every three years after 69 years of age. ? If you are overweight and have a high risk for diabetes, consider being tested at a younger age or more often. Preventing infection Hepatitis B  If you have a higher risk for hepatitis B, you should be screened for this virus. You are considered at high risk for hepatitis B if: ? You were born in a country where hepatitis B is common. Ask your health care provider which countries are considered high risk. ? Your parents were born in a high-risk country, and you have not been immunized against hepatitis B (hepatitis B vaccine). ? You have HIV or AIDS. ? You use needles to inject street drugs. ? You live with someone who has hepatitis B. ? You have had sex with someone who has hepatitis B. ? You get hemodialysis treatment. ? You take certain medicines for conditions, including cancer, organ transplantation, and autoimmune conditions.  Hepatitis C  Blood testing is recommended for: ? Everyone born from [birth year] through 1967. ? Anyone with known risk factors for hepatitis C.  Sexually transmitted infections (STIs)  You should be screened for sexually transmitted infections (STIs) including gonorrhea and chlamydia if: ? You are sexually active and are younger than 69 years of age. ? You are older than 69 years of age and your health care provider tells you that you are at risk for this type of infection. ? Your sexual activity has changed since you were last screened and you are at an increased risk for chlamydia or gonorrhea. Ask your health care provider if you are at risk.  If you do not have HIV, but are at risk, it Michaelis be recommended that you take a prescription medicine daily to prevent HIV infection. This is called pre-exposure prophylaxis  (PrEP). You are considered at risk if: ? You are sexually active and do not regularly use condoms or know the HIV status of your partner(s). ? You take drugs by injection. ? You are sexually active with a partner who has HIV.  Talk with your health care provider about whether you are at high risk of being infected with HIV. If you choose to begin PrEP, you should first be tested for HIV. You should  then be tested every 3 months for as long as you are taking PrEP. Pregnancy  If you are premenopausal and you may become pregnant, ask your health care provider about preconception counseling.  If you may become pregnant, take 400 to 800 micrograms (mcg) of folic acid every day.  If you want to prevent pregnancy, talk to your health care provider about birth control (contraception). Osteoporosis and menopause  Osteoporosis is a disease in which the bones lose minerals and strength with aging. This can result in serious bone fractures. Your risk for osteoporosis can be identified using a bone density scan.  If you are 40 years of age or older, or if you are at risk for osteoporosis and fractures, ask your health care provider if you should be screened.  Ask your health care provider whether you should take a calcium or vitamin D supplement to lower your risk for osteoporosis.  Menopause may have certain physical symptoms and risks.  Hormone replacement therapy may reduce some of these symptoms and risks. Talk to your health care provider about whether hormone replacement therapy is right for you. Follow these instructions at home:  Schedule regular health, dental, and eye exams.  Stay current with your immunizations.  Do not use any tobacco products including cigarettes, chewing tobacco, or electronic cigarettes.  If you are pregnant, do not drink alcohol.  If you are breastfeeding, limit how much and how often you drink alcohol.  Limit alcohol intake to no more than 1 drink per day for  nonpregnant women. One drink equals 12 ounces of beer, 5 ounces of wine, or 1 ounces of hard liquor.  Do not use street drugs.  Do not share needles.  Ask your health care provider for help if you need support or information about quitting drugs.  Tell your health care provider if you often feel depressed.  Tell your health care provider if you have ever been abused or do not feel safe at home. This information is not intended to replace advice given to you by your health care provider. Make sure you discuss any questions you have with your health care provider. Document Released: 02/25/2011 Document Revised: 01/18/2016 Document Reviewed: 05/16/2015 Elsevier Interactive Patient Education  [employer].

## 2017-11-18 NOTE — Assessment & Plan Note (Signed)
Preventative protocols reviewed and updated unless pt declined. Discussed healthy diet and lifestyle.  

## 2017-11-24 ENCOUNTER — Telehealth: Payer: Self-pay | Admitting: Family Medicine

## 2017-11-24 NOTE — Telephone Encounter (Signed)
Copied from [place] [id number]. Topic: Quick Communication - See Telephone Encounter >> Nov 24, 2017  3:03 PM Bea Graff, NT wrote: CRM for notification. See Telephone encounter for: 11/24/17. Ronnie with [facility] states that the pt would like a CPAP machine replacement. But there is not pressure setting on the machine and he has a history of pressure setting at 17. He needs recent OV notes within last 6 months that speaks of usage and benefits of CPAP therapy. Fax#: [phone] CB#: [phone] ext: [phone].

## 2017-11-25 DIAGNOSIS — G4733 Obstructive sleep apnea (adult) (pediatric): Secondary | ICD-10-CM | POA: Diagnosis not present

## 2017-11-25 NOTE — Telephone Encounter (Signed)
Left message for Jennifer Pineda (or Jennifer Pineda?) with [facility] relaying Dr. Synthia Innocent message.

## 2017-11-25 NOTE — Telephone Encounter (Signed)
May send CPE note. If not sufficient, pt will need to return to see pulm as discussed at office visit.  Let me know if needs a referral from [place].

## 2017-11-28 ENCOUNTER — Telehealth: Payer: Self-pay | Admitting: Pulmonary Disease

## 2017-11-28 NOTE — Telephone Encounter (Signed)
Pt calling stating she needs a new CPAP machine  She states she broke the top of the one she has  [facility] currently uses [facility] They advised her they need orders to do this  Please advise

## 2017-11-28 NOTE — Telephone Encounter (Signed)
Spoke with pt and she states that her CPAP machine has broke. Informed pt she would have to be seen. appt scheduled. Nothing further needed.

## 2017-12-03 ENCOUNTER — Ambulatory Visit (INDEPENDENT_AMBULATORY_CARE_PROVIDER_SITE_OTHER): Payer: PPO | Admitting: Internal Medicine

## 2017-12-03 ENCOUNTER — Encounter: Payer: Self-pay | Admitting: Internal Medicine

## 2017-12-03 VITALS — BP 136/78 | HR 61 | Temp 97.9°F | Wt 189.0 lb

## 2017-12-03 DIAGNOSIS — J309 Allergic rhinitis, unspecified: Secondary | ICD-10-CM

## 2017-12-03 MED ORDER — HYDROCOD POLST-CPM POLST ER 10-8 MG/5ML PO SUER: 5.0000 mL | Freq: Every evening | ORAL | 0 refills | Status: DC | PRN

## 2017-12-03 MED ORDER — METHYLPREDNISOLONE ACETATE 80 MG/ML IJ SUSP
80.0000 mg | Freq: Once | INTRAMUSCULAR | Status: AC
  Administered 2017-12-03: 80 mg via INTRAMUSCULAR

## 2017-12-03 NOTE — Patient Instructions (Signed)

## 2017-12-03 NOTE — Progress Notes (Signed)
HPI  Pt presents to the clinic today with c/o headache, facial pressure, nasal congestion and cough. This started 4 days ago. She is blowing yellow mucous out of her nose. The cough is productive of white/yellow mucous. She denies fever, chills or body aches. She has tried Claritin,  Environmental manager. She has a history of asthma and allergies. She has not had sick contacts.  Review of Systems     Past Medical History:  Diagnosis Date  . Allergic rhinitis   . Anxiety and depression   . Arthritis    hands  . Asthma, persistent   . CAD (coronary artery disease)    per CT 06-02-2015  . Complication of anesthesia    slow to wake  . COPD, mild ([place])    pulmologist--  dr Vilinda Boehringer ([facility] in [place])  . Diet-controlled diabetes mellitus ([place]) 05/01/2011   Completed DSME 10/2015   . Diverticulosis of colon   . Essential hypertension   . GERD (gastroesophageal reflux disease)   . History of cellulitis    Nov 2015-- nasal cellulitis--  resolved  . History of endometriosis   . History of hiatal hernia   . Hyperlipidemia   . OSA on CPAP    severe per study 05-09-2010  . PONV (postoperative nausea and vomiting)   . Shingles 12/29/2006  . Sleep apnea   . Urothelial carcinoma ([place]) 2016   superficial papillary L lateral bladder wall s/p BCG therapy ([place])    Family History  Problem Relation Age of Onset  . Cancer Mother        kidney and lymphoma CA, neprectomy tumor of lung, non smoker  . Kidney disease Mother        T-cell cancer in kidney  . Aneurysm Father        ? in throat  . Diabetes Father   . Heart disease Father        MI  . Diabetes Sister   . Hyperlipidemia Sister   . Heart disease Brother 23       MI  . Alcohol abuse Brother        smokes again  . Hypertension Brother   . Cancer Brother 56       T-cell renal cancer/ removed  . Hypertension Brother   . Heart disease Maternal Aunt        MI  . Colon cancer Neg Hx   . Esophageal cancer  Neg Hx   . Rectal cancer Neg Hx   . Stomach cancer Neg Hx   . Breast cancer Neg Hx     Social History   Socioeconomic History  . Marital status: Divorced    Spouse name: Not on file  . Number of children: 2  . Years of education: Not on file  . Highest education level: Not on file  Occupational History  . Occupation: Event organiser: [employer]    Comment: [place]  Social Needs  . Financial resource strain: Not on file  . Food insecurity:    Worry: Not on file    Inability: Not on file  . Transportation needs:    Medical: Not on file    Non-medical: Not on file  Tobacco Use  . Smoking status: Former Smoker    Packs/day: 2.00    Years: 19.00    Pack years: 38.00    Types: Cigarettes    Last attempt to quit: 08/26/1986    Years since  quitting: 31.2  . Smokeless tobacco: Never Used  Substance and Sexual Activity  . Alcohol use: Yes    Comment: rarely  . Drug use: No  . Sexual activity: Never  Lifestyle  . Physical activity:    Days per week: Not on file    Minutes per session: Not on file  . Stress: Not on file  Relationships  . Social connections:    Talks on phone: Not on file    Gets together: Not on file    Attends religious service: Not on file    Active member of club or organization: Not on file    Attends meetings of clubs or organizations: Not on file    Relationship status: Not on file  . Intimate partner violence:    Fear of current or ex partner: Not on file    Emotionally abused: Not on file    Physically abused: Not on file    Forced sexual activity: Not on file  Other Topics Concern  . Not on file  Social History Narrative   Divorced   Daughter [name], lives with her   Children: 2 children // one daughter lives with mother/ son lives with father   Activity: 20 min walk daily   Diet: good water, fruits/vegetables daily.     Allergies  Allergen Reactions  . Levofloxacin Shortness Of Breath and Swelling   . Ampicillin Diarrhea and Other (See Comments)     stomach upset, tolerated amoxicillin ok  . Doxycycline Hyclate Diarrhea and Other (See Comments)     stomach upset  . Metoprolol Succinate Diarrhea and Other (See Comments)    stomach upset  . Other Swelling    Horseradish  . Tetracycline Hcl Diarrhea and Other (See Comments)     stomach upset  . Adhesive [Tape] Rash     Constitutional: Positive headache. Denies fatigue, fever or abrupt weight changes.  HEENT:  Positive facial pain, nasal congestion. Denies eye redness, ear pain, ringing in the ears, wax buildup, runny nose or sore throat. Respiratory: Positive cough. Denies difficulty breathing or shortness of breath.  Cardiovascular: Denies chest pain, chest tightness, palpitations or swelling in the hands or feet.   No other specific complaints in a complete review of systems (except as listed in HPI above).  Objective:   BP 136/78   Pulse 61   Temp 97.9 F (36.6 C) (Oral)   Wt 189 lb (85.7 kg)   SpO2 97%   BMI 32.44 kg/m   General: Appears her stated age, in NAD. HEENT: Head: normal shape and size, no sinus tenderness noted; Ears: Tm's gray and intact, normal light reflex; Nose: mucosa boggy and moist, septum midline; Throat/Mouth: + PND. Teeth present, mucosa erythematous and moist, no exudate noted, no lesions or ulcerations noted.  Neck:  No adenopathy noted.  Pulmonary/Chest: Normal effort and positive vesicular breath sounds. No respiratory distress. No wheezes, rales or ronchi noted.       Assessment & Plan:   Allergic Rhinitis:  Can use a Neti Pot which can be purchased from your local drug store. Flonase 2 sprays each nostril for 3 days and then as needed. 80 mg Depo IM today eRx for Tussionex for cough  RTC as needed or if symptoms persist. Webb Silversmith, NP

## 2017-12-08 ENCOUNTER — Ambulatory Visit: Payer: PPO | Admitting: Pulmonary Disease

## 2017-12-08 ENCOUNTER — Encounter: Payer: Self-pay | Admitting: Internal Medicine

## 2017-12-08 ENCOUNTER — Encounter: Payer: Self-pay | Admitting: Pulmonary Disease

## 2017-12-08 VITALS — BP 140/80 | HR 63 | Ht 64.0 in | Wt 187.0 lb

## 2017-12-08 DIAGNOSIS — R05 Cough: Secondary | ICD-10-CM

## 2017-12-08 DIAGNOSIS — G4733 Obstructive sleep apnea (adult) (pediatric): Secondary | ICD-10-CM

## 2017-12-08 DIAGNOSIS — Z87891 Personal history of nicotine dependence: Secondary | ICD-10-CM | POA: Diagnosis not present

## 2017-12-08 DIAGNOSIS — R059 Cough, unspecified: Secondary | ICD-10-CM

## 2017-12-08 NOTE — Progress Notes (Signed)
PULMONARY OFFICE FOLLOW UP NOTE   PT PROFILE: 67 y.o. F former smoker previously evaluated by Dr Stevenson Clinch for cough, DOE and treated with ICS/LABA, LAMA  PROBLEMS:  Former smoker Chronic cough OSA (dx'd 2012 by Dr Gwenette Greet)  DATA: 04/23/12 CT chest: normal 04/08/16 PFTs: No obstruction, normal TLC and DLCO  INTERVAL HISTORY: Last seen 10/2016. No major events  SUBJ: This is a routine reevaluation.  She is here specifically to try to get a new CPAP machine.  She was diagnosed with OSA by Dr. Gwenette Greet in 2012.  She has been tolerant to CPAP and uses it compliantly.  Recently, her device has developed an defect and she requests a new machine.   With regard to her respiratory status, has no new complaints.  He remains on Brio inhaler daily.  She rarely uses albuterol.  When I saw her a year ago, I recommended discontinuation of Spiriva and she noted no worsening in her respiratory symptoms.  She continues to deny CP, fever, purulent sputum, hemoptysis, LE edema and calf tenderness  OBJ: Vitals:   12/08/17 1128 12/08/17 1139  BP:  140/80  Pulse:  63  SpO2:  97%  Weight: 187 lb (84.8 kg)   Height: 5\' 4"  (1.626 m)     Gen: WDWN in NAD HEENT: NCAT, sclerae white, oropharynx normal Neck: No LAN, no JVD noted Lungs: full BS, normal percussion note throughout, no adventitious sounds Cardiovascular: Regular, normal rate, no M noted Abdomen: Soft, NT, +BS Ext: no C/C/E Neuro: PERRL, EOMI, motor/sensory grossly intact Skin: No lesions noted    DATA: BMP Latest Ref Rng & Units 11/11/2017 09/30/2016 09/01/2015  Glucose 70 - 99 mg/dL 100(H) 102(H) 141(H)  BUN 6 - 23 mg/dL 17 18 13   Creatinine 0.40 - 1.20 mg/dL 0.71 0.86 0.78  Sodium 135 - 145 mEq/L 138 137 139  Potassium 3.5 - 5.1 mEq/L 3.9 4.6 3.6  Chloride 96 - 112 mEq/L 97 97 100  CO2 19 - 32 mEq/L 31 32 30  Calcium 8.4 - 10.5 mg/dL 9.9 10.4 9.4    CBC Latest Ref Rng & Units 11/04/2016 09/30/2016 09/01/2015  WBC 4.0 - 10.5 K/uL 10.7(H)  12.5(H) 8.4  Hemoglobin 12.0 - 15.0 g/dL 13.8 14.4 11.9(L)  Hematocrit 36.0 - 46.0 % 41.1 42.9 36.9  Platelets 150.0 - 400.0 K/uL 294.0 337.0 285.0   No new chest x-ray  IMPRESSION: Chronic intermittent cough-controlled with Breo inhaler  OSA (obstructive sleep apnea) - Plan: Ambulatory Referral for DME  Former smoker  PLAN: Continue Breo inhaler Continue albuterol inhaler as needed We will work on getting a new CPAP machine  CPAP setting 17 cm H2O  She is using compliantly and benefiting from it Follow-up in 1 year or sooner as needed   Merton Border, MD PCCM service Mobile [phone] Pager [phone] 12/08/2017 11:54 AM

## 2017-12-08 NOTE — Patient Instructions (Signed)
Continue Breo inhaler Continue albuterol inhaler as needed We will work on getting a new CPAP machine Follow-up in 1 year or sooner as needed

## 2017-12-17 ENCOUNTER — Other Ambulatory Visit: Payer: Self-pay | Admitting: Family Medicine

## 2018-01-22 DIAGNOSIS — G4733 Obstructive sleep apnea (adult) (pediatric): Secondary | ICD-10-CM | POA: Diagnosis not present

## 2018-02-04 DIAGNOSIS — C672 Malignant neoplasm of lateral wall of bladder: Secondary | ICD-10-CM | POA: Diagnosis not present

## 2018-02-22 DIAGNOSIS — G4733 Obstructive sleep apnea (adult) (pediatric): Secondary | ICD-10-CM | POA: Diagnosis not present

## 2018-03-03 DIAGNOSIS — C672 Malignant neoplasm of lateral wall of bladder: Secondary | ICD-10-CM | POA: Diagnosis not present

## 2018-03-10 DIAGNOSIS — Z5111 Encounter for antineoplastic chemotherapy: Secondary | ICD-10-CM | POA: Diagnosis not present

## 2018-03-10 DIAGNOSIS — C672 Malignant neoplasm of lateral wall of bladder: Secondary | ICD-10-CM | POA: Diagnosis not present

## 2018-03-17 DIAGNOSIS — Z5111 Encounter for antineoplastic chemotherapy: Secondary | ICD-10-CM | POA: Diagnosis not present

## 2018-03-17 DIAGNOSIS — C672 Malignant neoplasm of lateral wall of bladder: Secondary | ICD-10-CM | POA: Diagnosis not present

## 2018-03-24 DIAGNOSIS — G4733 Obstructive sleep apnea (adult) (pediatric): Secondary | ICD-10-CM | POA: Diagnosis not present

## 2018-03-31 ENCOUNTER — Other Ambulatory Visit: Payer: Self-pay

## 2018-03-31 ENCOUNTER — Telehealth: Payer: Self-pay | Admitting: Family Medicine

## 2018-03-31 ENCOUNTER — Telehealth: Payer: Self-pay | Admitting: Pulmonary Disease

## 2018-03-31 MED ORDER — FLUTICASONE FUROATE-VILANTEROL 100-25 MCG/INH IN AEPB: 1.0000 | INHALATION_SPRAY | Freq: Every day | RESPIRATORY_TRACT | 3 refills | Status: DC

## 2018-03-31 NOTE — Telephone Encounter (Signed)
Copied from [place] [id number]. Topic: Quick Communication - Rx Refill/Question >> Mar 31, 2018 11:44 AM Bea Graff, NT wrote: Medication: fluticasone furoate-vilanterol (BREO ELLIPTA) 100-25 MCG/INH AEPB   Has the patient contacted their pharmacy? Yes.   (Agent: If no, request that the patient contact the pharmacy for the refill.) (Agent: If yes, when and what did the pharmacy advise?)  Preferred Pharmacy (with phone number or street name): [facility] [address] [place] - [place] [phone] (Phone) [phone] (Fax)      Agent: Please be advised that RX refills may take up to 3 business days. We ask that you follow-up with your pharmacy.

## 2018-03-31 NOTE — Telephone Encounter (Signed)
Patient was told by ? [facility] ?  [phone] ext [phone])  to come in for ov to comply with insurance for machine .  Patient not due per recall until April 2020.  Please advise.

## 2018-03-31 NOTE — Telephone Encounter (Signed)
Pt needs 3 mos cpap compliance visit. She needs to be seen this month.

## 2018-04-01 NOTE — Telephone Encounter (Signed)
Patient scheduled 04/16/18 with Dr Alva Garnet

## 2018-04-01 NOTE — Telephone Encounter (Signed)
Noted  

## 2018-04-16 ENCOUNTER — Encounter: Payer: Self-pay | Admitting: Pulmonary Disease

## 2018-04-16 ENCOUNTER — Ambulatory Visit: Payer: PPO | Admitting: Pulmonary Disease

## 2018-04-16 VITALS — BP 138/80 | HR 78 | Ht 64.0 in | Wt 189.0 lb

## 2018-04-16 DIAGNOSIS — R05 Cough: Secondary | ICD-10-CM | POA: Diagnosis not present

## 2018-04-16 DIAGNOSIS — G4733 Obstructive sleep apnea (adult) (pediatric): Secondary | ICD-10-CM | POA: Diagnosis not present

## 2018-04-16 DIAGNOSIS — R053 Chronic cough: Secondary | ICD-10-CM

## 2018-04-16 NOTE — Progress Notes (Signed)
PULMONARY OFFICE FOLLOW UP NOTE  PT PROFILE: 67 y.o. F former smoker previously evaluated by Dr Stevenson Clinch for cough, DOE and treated with ICS/LABA, LAMA  PROBLEMS:  Former smoker Chronic cough OSA (dx'd 2012 by Dr Gwenette Greet)  DATA: 04/23/12 CT chest: normal 04/08/16 PFTs: No obstruction, normal TLC and DLCO CPAP compliance 07//19 - 08//19: 17 cm H2O, usage 30/30, > 4 hrs 28/30, AHI 2.1   INTERVAL HISTORY: Last seen 12/08/17. No major events  SUBJ: She was told that she needed an office visit for CPAP compliance.  She is using Breo inhaler daily for cough.  She ran out for a few days recently had noted that her cough relapsed within 2 or 3 days off of this medication.  She is back on the Breo inhaler with complete control of her cough.  I offered that we change to an inhaled corticosteroid alone.  She wishes to continue her current therapy.  With regard to obstructive sleep apnea, she is wearing CPAP compliantly.  She has much improved daytime hypersomnolence.  She does have moderate intermittent insomnia due to stressors in her life.  She has no problems tolerating the CPAP.  OBJ: Vitals:   04/16/18 1056 04/16/18 1100  BP:  138/80  Pulse:  78  SpO2:  97%  Weight: 189 lb (85.7 kg)   Height: 5\' 4"  (1.626 m)     Gen: NAD HEENT: NCAT, sclera white Neck: No JVD Lungs: breath sounds full, no wheezes or other adventitious sounds Cardiovascular: RRR, no murmurs Abdomen: Soft, nontender, normal BS Ext: without clubbing, cyanosis, edema Neuro: grossly intact Skin: Limited exam, no lesions noted     DATA: BMP Latest Ref Rng & Units 11/11/2017 09/30/2016 09/01/2015  Glucose 70 - 99 mg/dL 100(H) 102(H) 141(H)  BUN 6 - 23 mg/dL 17 18 13   Creatinine 0.40 - 1.20 mg/dL 0.71 0.86 0.78  Sodium 135 - 145 mEq/L 138 137 139  Potassium 3.5 - 5.1 mEq/L 3.9 4.6 3.6  Chloride 96 - 112 mEq/L 97 97 100  CO2 19 - 32 mEq/L 31 32 30  Calcium 8.4 - 10.5 mg/dL 9.9 10.4 9.4    CBC Latest Ref Rng & Units  11/04/2016 09/30/2016 09/01/2015  WBC 4.0 - 10.5 K/uL 10.7(H) 12.5(H) 8.4  Hemoglobin 12.0 - 15.0 g/dL 13.8 14.4 11.9(L)  Hematocrit 36.0 - 46.0 % 41.1 42.9 36.9  Platelets 150.0 - 400.0 K/uL 294.0 337.0 285.0   No new chest x-ray  IMPRESSION: OSA, well-controlled on CPAP which she is wearing compliantly and from which she is benefiting   Chronic cough  PLAN: Continue Breo inhaler Continue CPAP, current settings 17 cm H2O Follow-up in 1 year or sooner as needed   Merton Border, MD PCCM service Mobile [phone] Pager [phone] 04/16/2018 11:19 AM

## 2018-04-16 NOTE — Patient Instructions (Signed)
Continue Breo inhaler daily.  Rinse mouth after use  Continue CPAP at current setting.  Follow-up in 1 year.  Call sooner if needed

## 2018-04-24 DIAGNOSIS — G4733 Obstructive sleep apnea (adult) (pediatric): Secondary | ICD-10-CM | POA: Diagnosis not present

## 2018-05-20 ENCOUNTER — Encounter: Payer: Self-pay | Admitting: Family Medicine

## 2018-05-20 ENCOUNTER — Ambulatory Visit (INDEPENDENT_AMBULATORY_CARE_PROVIDER_SITE_OTHER): Payer: PPO | Admitting: Family Medicine

## 2018-05-20 VITALS — BP 154/72 | HR 66 | Temp 98.2°F | Ht 64.0 in | Wt 186.8 lb

## 2018-05-20 DIAGNOSIS — Z23 Encounter for immunization: Secondary | ICD-10-CM

## 2018-05-20 DIAGNOSIS — E119 Type 2 diabetes mellitus without complications: Secondary | ICD-10-CM | POA: Diagnosis not present

## 2018-05-20 DIAGNOSIS — I10 Essential (primary) hypertension: Secondary | ICD-10-CM | POA: Diagnosis not present

## 2018-05-20 LAB — POCT GLYCOSYLATED HEMOGLOBIN (HGB A1C): Hemoglobin A1C: 6.3 % — AB (ref 4.0–5.6)

## 2018-05-20 NOTE — Patient Instructions (Addendum)
You are doing well today - continue healthy changes. A1c was better! Flu shot today Return as needed or in 6 months for wellness visit and physical

## 2018-05-20 NOTE — Assessment & Plan Note (Addendum)
Chronic, improved readings.  Congratulated on healthy changes.  Continue diet controlled - now in prediabetes range. Reassess at CPE in 6 months.

## 2018-05-20 NOTE — Progress Notes (Signed)
BP (!) 154/72 (BP Location: Right Arm, Patient Position: Sitting, Cuff Size: Normal)   Pulse 66   Temp 98.2 F (36.8 C) (Oral)   Ht '5\' 4"'$  (1.626 m)   Wt 186 lb 12 oz (84.7 kg)   SpO2 96%   BMI 32.06 kg/m   BP Readings from Last 3 Encounters:  05/20/18 (!) 154/72  04/16/18 138/80  12/08/17 140/80    CC: 44moDM f/u visit Subjective:    Patient ID: [id number] Morton female    DOB: 8Mar 23, 1952 66y.o.   MRN: [id number] HPI: SFREDONIA CASALINOis a 67y.o. female presenting on 05/20/2018 for Diabetes (Here for 6 mo f/u.)   OSA followed by pulm ([place]) Chronic cough - managed with daily breo with benefit.  HTN - compliant with amlodipine and maxzide. bp well controlled at home. No HA, vision changes, CP/tightness, SOB, leg swelling.   DM - does regularly check sugars this morning fasting 111. Compliant with antihyperglycemic regimen which includes: diet controlled. Working on diabetic diet. Denies low sugars or hypoglycemic symptoms. Some hand paresthesias when in bed (?CTS - types all day). Last diabetic eye exam 07/2017. Pneumovax: 2017. Prevnar: 2015. Glucometer brand: onetouch. DSME: completed 10/2015. Lab Results  Component Value Date   HGBA1C 6.3 (A) 05/20/2018   Diabetic Foot Exam - Simple   Simple Foot Form Diabetic Foot exam was performed with the following findings:  Yes 05/20/2018  3:18 PM  Visual Inspection No deformities, no ulcerations, no other skin breakdown bilaterally:  Yes Sensation Testing Intact to touch and monofilament testing bilaterally:  Yes Pulse Check Posterior Tibialis and Dorsalis pulse intact bilaterally:  Yes Comments 2+ PT    Lab Results  Component Value Date   MICROALBUR 1.5 11/11/2017     Relevant past medical, surgical, family and social history reviewed and updated as indicated. Interim medical history since our last visit reviewed. Allergies and medications reviewed and updated. Outpatient Medications Prior to Visit  Medication Sig  Dispense Refill  . albuterol (PROVENTIL HFA;VENTOLIN HFA) 108 (90 Base) MCG/ACT inhaler Inhale 2 puffs into the lungs every 6 (six) hours as needed. For shortness of breath. 54 Inhaler 3  . amLODipine (NORVASC) 10 MG tablet Take 1 tablet (10 mg total) by mouth daily. 90 tablet 3  . atorvastatin (LIPITOR) 10 MG tablet Take 1 tablet (10 mg total) by mouth daily. 90 tablet 3  . Blood Glucose Monitoring Suppl (ONE TOUCH ULTRA SYSTEM KIT) w/Device KIT 1 kit by Does not apply route once. 1 each 0  . FLUoxetine (PROZAC) 10 MG capsule Take 1 capsule (10 mg total) by mouth daily. 90 capsule 3  . fluticasone (FLONASE) 50 MCG/ACT nasal spray Place 2 sprays into both nostrils daily. 48 g 3  . fluticasone furoate-vilanterol (BREO ELLIPTA) 100-25 MCG/INH AEPB Inhale 1 puff into the lungs daily. 90 each 3  . glucose blood (ONE TOUCH ULTRA TEST) test strip 1 each by Other route daily. Use to check sugar once daily and as needed. Dx: E11.9 100 each 3  . loratadine (CLARITIN) 10 MG tablet TAKE 1 TABLET BY MOUTH ONCE DAILY 30 tablet 11  . Multiple Vitamins-Calcium (VIACTIV MULTI-VITAMIN) CHEW Chew 1 tablet by mouth daily.    .Marland Kitchenomeprazole (PRILOSEC) 40 MG capsule Take one capsule daily 90 capsule 3  . ONETOUCH DELICA LANCETS FINE MISC Use as directed - check as needed or 3 times weekly 100 each 1  . triamterene-hydrochlorothiazide (MAXZIDE-25) 37.5-25 MG tablet Take 1 tablet  by mouth daily. 90 tablet 3  . chlorpheniramine-HYDROcodone (TUSSIONEX PENNKINETIC ER) 10-8 MG/5ML SUER Take 5 mLs by mouth at bedtime as needed. 140 mL 0  . HYDROcodone-homatropine (HYCODAN) 5-1.5 MG/5ML syrup Take 5 mLs by mouth every 8 (eight) hours as needed for cough. 120 mL 0  . tiotropium (SPIRIVA HANDIHALER) 18 MCG inhalation capsule Place 1 capsule (18 mcg total) into inhaler and inhale at bedtime. 30 capsule 3   No facility-administered medications prior to visit.      Per HPI unless specifically indicated in ROS section below Review  of Systems     Objective:    BP (!) 154/72 (BP Location: Right Arm, Patient Position: Sitting, Cuff Size: Normal)   Pulse 66   Temp 98.2 F (36.8 C) (Oral)   Ht '5\' 4"'$  (1.626 m)   Wt 186 lb 12 oz (84.7 kg)   SpO2 96%   BMI 32.06 kg/m   Wt Readings from Last 3 Encounters:  05/20/18 186 lb 12 oz (84.7 kg)  04/16/18 189 lb (85.7 kg)  12/08/17 187 lb (84.8 kg)    Physical Exam  Constitutional: She appears well-developed and well-nourished. No distress.  HENT:  Head: Normocephalic and atraumatic.  Right Ear: External ear normal.  Left Ear: External ear normal.  Nose: Nose normal.  Mouth/Throat: Oropharynx is clear and moist. No oropharyngeal exudate.  Eyes: Pupils are equal, round, and reactive to light. Conjunctivae and EOM are normal. No scleral icterus.  Neck: Normal range of motion. Neck supple.  Cardiovascular: Normal rate, regular rhythm, normal heart sounds and intact distal pulses.  No murmur heard. Pulmonary/Chest: Effort normal and breath sounds normal. No respiratory distress. She has no wheezes. She has no rales.  Musculoskeletal: She exhibits no edema.  See HPI for foot exam if done  Lymphadenopathy:    She has no cervical adenopathy.  Skin: Skin is warm and dry. No rash noted.  Psychiatric: She has a normal mood and affect.  Nursing note and vitals reviewed.  Results for orders placed or performed in visit on 05/20/18  POCT glycosylated hemoglobin (Hb A1C)  Result Value Ref Range   Hemoglobin A1C 6.3 (A) 4.0 - 5.6 %   HbA1c POC (<> result, manual entry)     HbA1c, POC (prediabetic range)     HbA1c, POC (controlled diabetic range)        Assessment & Plan:   Problem List Items Addressed This Visit    Essential hypertension    Chronic, elevated reading today - and forgot to recheck before she left. She states home readings have been well controlled. Will monitor for now - continue amlodipine and maxzide.       Controlled type 2 diabetes mellitus without  complication, without long-term current use of insulin (HCC) - Primary    Chronic, improved readings.  Congratulated on healthy changes.  Continue diet controlled - now in prediabetes range. Reassess at CPE in 6 months.       Relevant Orders   POCT glycosylated hemoglobin (Hb A1C) (Completed)    Other Visit Diagnoses    Need for influenza vaccination       Relevant Orders   Flu Vaccine QUAD 36+ mos IM (Completed)       No orders of the defined types were placed in this encounter.  Orders Placed This Encounter  Procedures  . Flu Vaccine QUAD 36+ mos IM  . POCT glycosylated hemoglobin (Hb A1C)    Follow up plan: Return in about 6 months (  around 11/18/2018), or if symptoms worsen or fail to improve, for annual exam, prior fasting for blood work, medicare wellness visit.  Ria Bush, MD

## 2018-05-20 NOTE — Assessment & Plan Note (Signed)
Chronic, elevated reading today - and forgot to recheck before she left. She states home readings have been well controlled. Will monitor for now - continue amlodipine and maxzide.

## 2018-05-22 ENCOUNTER — Ambulatory Visit: Payer: PPO | Admitting: Family Medicine

## 2018-05-25 DIAGNOSIS — G4733 Obstructive sleep apnea (adult) (pediatric): Secondary | ICD-10-CM | POA: Diagnosis not present

## 2018-06-24 DIAGNOSIS — G4733 Obstructive sleep apnea (adult) (pediatric): Secondary | ICD-10-CM | POA: Diagnosis not present

## 2018-07-25 DIAGNOSIS — G4733 Obstructive sleep apnea (adult) (pediatric): Secondary | ICD-10-CM | POA: Diagnosis not present

## 2018-07-27 ENCOUNTER — Ambulatory Visit (INDEPENDENT_AMBULATORY_CARE_PROVIDER_SITE_OTHER): Payer: PPO | Admitting: Family Medicine

## 2018-07-27 ENCOUNTER — Encounter: Payer: Self-pay | Admitting: Family Medicine

## 2018-07-27 VITALS — BP 136/68 | HR 60 | Temp 97.8°F | Ht 64.0 in | Wt 187.0 lb

## 2018-07-27 DIAGNOSIS — B029 Zoster without complications: Secondary | ICD-10-CM | POA: Diagnosis not present

## 2018-07-27 MED ORDER — VALACYCLOVIR HCL 1 G PO TABS: 1000.0000 mg | ORAL_TABLET | Freq: Three times a day (TID) | ORAL | 0 refills | Status: DC

## 2018-07-27 MED ORDER — GABAPENTIN 300 MG PO CAPS: 300.0000 mg | ORAL_CAPSULE | Freq: Two times a day (BID) | ORAL | 1 refills | Status: DC

## 2018-07-27 NOTE — Patient Instructions (Addendum)
I do think you have shingles.  Treat with valtrex course.  Take gabapentin 300mg  twice daily for nerve pain as needed. (first few days take once nightly to ensure tolerated.)  Let me know if not improving with treatment, or if rash spreading.  Contact precautions for blisters when they develop/rupture.   Shingles Shingles, which is also known as herpes zoster, is an infection that causes a painful skin rash and fluid-filled blisters. Shingles is not related to genital herpes, which is a sexually transmitted infection. Shingles only develops in people who:  Have had chickenpox.  Have received the chickenpox vaccine. (This is rare.)  What are the causes? Shingles is caused by varicella-zoster virus (VZV). This is the same virus that causes chickenpox. After exposure to VZV, the virus stays in the body in an inactive (dormant) state. Shingles develops if the virus reactivates. This can happen many years after the initial exposure to VZV. It is not known what causes this virus to reactivate. What increases the risk? People who have had chickenpox or received the chickenpox vaccine are at risk for shingles. Infection is more common in people who:  Are older than age 70.  Have a weakened defense (immune) system, such as those with HIV, AIDS, or cancer.  Are taking medicines that weaken the immune system, such as transplant medicines.  Are under great stress.  What are the signs or symptoms? Early symptoms of this condition include itching, tingling, and pain in an area on your skin. Pain may be described as burning, stabbing, or throbbing. A few days or weeks after symptoms start, a painful red rash appears, usually on one side of the body in a bandlike or beltlike pattern. The rash eventually turns into fluid-filled blisters that break open, scab over, and dry up in about 2-3 weeks. At any time during the infection, you may also develop:  A fever.  Chills.  A headache.  An upset  stomach.  How is this diagnosed? This condition is diagnosed with a skin exam. Sometimes, skin or fluid samples are taken from the blisters before a diagnosis is made. These samples are examined under a microscope or sent to a lab for testing. How is this treated? There is no specific cure for this condition. Your health care provider will probably prescribe medicines to help you manage pain, recover more quickly, and avoid long-term problems. Medicines may include:  Antiviral drugs.  Anti-inflammatory drugs.  Pain medicines.  If the area involved is on your face, you may be referred to a specialist, such as an eye doctor (ophthalmologist) or an ear, nose, and throat (ENT) doctor to help you avoid eye problems, chronic pain, or disability. Follow these instructions at home: Medicines  Take medicines only as directed by your health care provider.  Apply an anti-itch or numbing cream to the affected area as directed by your health care provider. Blister and Rash Care  Take a cool bath or apply cool compresses to the area of the rash or blisters as directed by your health care provider. This may help with pain and itching.  Keep your rash covered with a loose bandage (dressing). Wear loose-fitting clothing to help ease the pain of material rubbing against the rash.  Keep your rash and blisters clean with mild soap and cool water or as directed by your health care provider.  Check your rash every day for signs of infection. These include redness, swelling, and pain that lasts or increases.  Do not pick your  blisters.  Do not scratch your rash. General instructions  Rest as directed by your health care provider.  Keep all follow-up visits as directed by your health care provider. This is important.  Until your blisters scab over, your infection can cause chickenpox in people who have never had it or been vaccinated against it. To prevent this from happening, avoid contact with other  people, especially: ? Babies. ? Pregnant women. ? Children who have eczema. ? Elderly people who have transplants. ? People who have chronic illnesses, such as leukemia or AIDS. Contact a health care provider if:  Your pain is not relieved with prescribed medicines.  Your pain does not get better after the rash heals.  Your rash looks infected. Signs of infection include redness, swelling, and pain that lasts or increases. Get help right away if:  The rash is on your face or nose.  You have facial pain, pain around your eye area, or loss of feeling on one side of your face.  You have ear pain or you have ringing in your ear.  You have loss of taste.  Your condition gets worse. This information is not intended to replace advice given to you by your health care provider. Make sure you discuss any questions you have with your health care provider. Document Released: 08/12/2005 Document Revised: 04/07/2016 Document Reviewed: 06/23/2014 Elsevier Interactive Patient Education  2018 [employer].

## 2018-07-27 NOTE — Progress Notes (Signed)
BP 136/68 (BP Location: Left Arm, Patient Position: Sitting, Cuff Size: Normal)   Pulse 60   Temp 97.8 F (36.6 C) (Oral)   Ht '5\' 4"'$  (1.626 m)   Wt 187 lb (84.8 kg)   SpO2 98%   BMI 32.10 kg/m    CC: R side rash Subjective:    Patient ID: Jennifer Pineda, female    DOB: [birth year], 67 y.o.   MRN: [id number]  HPI: Jennifer Pineda is a 67 y.o. female presenting on 07/27/2018 for Herpes Zoster (C/o rash on right side of mid back and around to right breast. Started with itching 07/22/18 then pain and rash started the next day. Tried ibuprofen. )   Itching/tingling started last week, rash started 07/23/2018. Progressively worse since then. Treating with ibuprofen. Started on back, travels to R lower breast.   H/o shingles >8 yrs ago zostavax 2015.   Exposed to 32 yo grand daughter.   Relevant past medical, surgical, family and social history reviewed and updated as indicated. Interim medical history since our last visit reviewed. Allergies and medications reviewed and updated. Outpatient Medications Prior to Visit  Medication Sig Dispense Refill  . albuterol (PROVENTIL HFA;VENTOLIN HFA) 108 (90 Base) MCG/ACT inhaler Inhale 2 puffs into the lungs every 6 (six) hours as needed. For shortness of breath. 54 Inhaler 3  . amLODipine (NORVASC) 10 MG tablet Take 1 tablet (10 mg total) by mouth daily. 90 tablet 3  . atorvastatin (LIPITOR) 10 MG tablet Take 1 tablet (10 mg total) by mouth daily. 90 tablet 3  . Blood Glucose Monitoring Suppl (ONE TOUCH ULTRA SYSTEM KIT) w/Device KIT 1 kit by Does not apply route once. 1 each 0  . FLUoxetine (PROZAC) 10 MG capsule Take 1 capsule (10 mg total) by mouth daily. 90 capsule 3  . fluticasone (FLONASE) 50 MCG/ACT nasal spray Place 2 sprays into both nostrils daily. 48 g 3  . fluticasone furoate-vilanterol (BREO ELLIPTA) 100-25 MCG/INH AEPB Inhale 1 puff into the lungs daily. 90 each 3  . glucose blood (ONE TOUCH ULTRA TEST) test strip 1 each by Other  route daily. Use to check sugar once daily and as needed. Dx: E11.9 100 each 3  . loratadine (CLARITIN) 10 MG tablet TAKE 1 TABLET BY MOUTH ONCE DAILY 30 tablet 11  . Multiple Vitamins-Calcium (VIACTIV MULTI-VITAMIN) CHEW Chew 1 tablet by mouth daily.    Marland Kitchen omeprazole (PRILOSEC) 40 MG capsule Take one capsule daily 90 capsule 3  . ONETOUCH DELICA LANCETS FINE MISC Use as directed - check as needed or 3 times weekly 100 each 1  . triamterene-hydrochlorothiazide (MAXZIDE-25) 37.5-25 MG tablet Take 1 tablet by mouth daily. 90 tablet 3   No facility-administered medications prior to visit.      Per HPI unless specifically indicated in ROS section below Review of Systems     Objective:    BP 136/68 (BP Location: Left Arm, Patient Position: Sitting, Cuff Size: Normal)   Pulse 60   Temp 97.8 F (36.6 C) (Oral)   Ht '5\' 4"'$  (1.626 m)   Wt 187 lb (84.8 kg)   SpO2 98%   BMI 32.10 kg/m   Wt Readings from Last 3 Encounters:  07/27/18 187 lb (84.8 kg)  05/20/18 186 lb 12 oz (84.7 kg)  04/16/18 189 lb (85.7 kg)    Physical Exam  Constitutional: She appears well-developed and well-nourished. No distress.  Skin: Skin is warm and dry. Rash noted. No erythema.     Raised  bumps on erythematous base throughout R upper back into R bra, not truly vesicular yet. Very tender and itchy.   Nursing note and vitals reviewed.     Assessment & Plan:   Problem List Items Addressed This Visit    Herpes zoster - Primary    R upper back into R bra. Most consistent with shingles although not vesicular - anticipate early shingles. Rx valtrex, gabapentin for neuropathic pain. Out of work x 2 days, update [place] with effect. Pt agrees with plan.  Discussed sedation precautions with gabapentin.       Relevant Medications   valACYclovir (VALTREX) 1000 MG tablet       Meds ordered this encounter  Medications  . valACYclovir (VALTREX) 1000 MG tablet    Sig: Take 1 tablet (1,000 mg total) by mouth 3 (three)  times daily.    Dispense:  21 tablet    Refill:  0  . gabapentin (NEURONTIN) 300 MG capsule    Sig: Take 1 capsule (300 mg total) by mouth 2 (two) times daily.    Dispense:  60 capsule    Refill:  1   No orders of the defined types were placed in this encounter.   Follow up plan: Return if symptoms worsen or fail to improve.  Ria Bush, MD

## 2018-07-27 NOTE — Assessment & Plan Note (Addendum)
R upper back into R bra. Most consistent with shingles although not vesicular - anticipate early shingles. Rx valtrex, gabapentin for neuropathic pain. Out of work x 2 days, update [place] with effect. Pt agrees with plan.  Discussed sedation precautions with gabapentin.

## 2018-07-29 ENCOUNTER — Telehealth: Payer: Self-pay

## 2018-07-29 NOTE — Telephone Encounter (Signed)
Can increase gabapentin as tolerated to 1 tab three times a day or 1 in am and 2 in pm. Let us know if nerve pain still not controlled.

## 2018-07-29 NOTE — Telephone Encounter (Signed)
Spoke with pt relaying Dr. G's instructions.  Pt verbalizes understanding.  

## 2018-07-29 NOTE — Telephone Encounter (Signed)
Pt left v/m; pt was to cb with update on pain.pt seen 07/27/18. Pt taking gabapentin and the med does not make pt sleepy but not helping pain by itself. Pt is also taking ibuprofen and that makes pain manageable. FYI to Dr Darnell Level.

## 2018-08-05 DIAGNOSIS — C672 Malignant neoplasm of lateral wall of bladder: Secondary | ICD-10-CM | POA: Diagnosis not present

## 2018-08-21 ENCOUNTER — Emergency Department: Payer: PPO

## 2018-08-21 ENCOUNTER — Other Ambulatory Visit: Payer: Self-pay

## 2018-08-21 ENCOUNTER — Emergency Department
Admission: EM | Admit: 2018-08-21 | Discharge: 2018-08-21 | Disposition: A | Discharge status: Home / Self Care | Payer: PPO | Attending: Emergency Medicine | Admitting: Emergency Medicine

## 2018-08-21 DIAGNOSIS — W01198A Fall on same level from slipping, tripping and stumbling with subsequent striking against other object, initial encounter: Secondary | ICD-10-CM | POA: Diagnosis not present

## 2018-08-21 DIAGNOSIS — Y998 Other external cause status: Secondary | ICD-10-CM | POA: Diagnosis not present

## 2018-08-21 DIAGNOSIS — I10 Essential (primary) hypertension: Secondary | ICD-10-CM | POA: Diagnosis not present

## 2018-08-21 DIAGNOSIS — I2510 Atherosclerotic heart disease of native coronary artery without angina pectoris: Secondary | ICD-10-CM | POA: Diagnosis not present

## 2018-08-21 DIAGNOSIS — M25511 Pain in right shoulder: Secondary | ICD-10-CM | POA: Diagnosis not present

## 2018-08-21 DIAGNOSIS — Z79899 Other long term (current) drug therapy: Secondary | ICD-10-CM | POA: Diagnosis not present

## 2018-08-21 DIAGNOSIS — E119 Type 2 diabetes mellitus without complications: Secondary | ICD-10-CM | POA: Insufficient documentation

## 2018-08-21 DIAGNOSIS — J449 Chronic obstructive pulmonary disease, unspecified: Secondary | ICD-10-CM | POA: Diagnosis not present

## 2018-08-21 DIAGNOSIS — Z87891 Personal history of nicotine dependence: Secondary | ICD-10-CM | POA: Diagnosis not present

## 2018-08-21 DIAGNOSIS — Y9354 Activity, bowling: Secondary | ICD-10-CM | POA: Insufficient documentation

## 2018-08-21 DIAGNOSIS — S4991XA Unspecified injury of right shoulder and upper arm, initial encounter: Secondary | ICD-10-CM | POA: Diagnosis not present

## 2018-08-21 DIAGNOSIS — Y9239 Other specified sports and athletic area as the place of occurrence of the external cause: Secondary | ICD-10-CM | POA: Insufficient documentation

## 2018-08-21 DIAGNOSIS — M25562 Pain in left knee: Secondary | ICD-10-CM | POA: Diagnosis not present

## 2018-08-21 MED ORDER — LIDOCAINE 1.8 % EX PTCH: 1.0000 | MEDICATED_PATCH | Freq: Every day | CUTANEOUS | 0 refills | Status: DC

## 2018-08-21 MED ORDER — LIDOCAINE 5 % EX PTCH
1.0000 | MEDICATED_PATCH | CUTANEOUS | Status: DC
  Administered 2018-08-21: 1 via TRANSDERMAL
  Filled 2018-08-21: qty 1

## 2018-08-21 NOTE — ED Provider Notes (Signed)
[facility] Emergency Department Provider Note   ____________________________________________   First MD Initiated Contact with Patient 08/21/18 0805     (approximate)  I have reviewed the triage vital signs and the nursing notes.   HISTORY  Chief Complaint Fall and Shoulder Injury    HPI Jennifer Pineda is a 67 y.o. female patient complain of right shoulder and left knee pain secondary to a fall.  Patient states she was bowling 4 days ago when her foot got stuck and she fell forward and landed on the left knee and fall onto the right shoulder.  Patient states the knee pain has improved but the shoulder pain has worsened.  Patient state pain increases with abduction and overhead reaching movement patient points to the head of humerus as a source of greatest discomfort.  Patient rates the pain as a 10/10.  Patient scribed pain is "achy".  No palliative measure for complaint.   Past Medical History:  Diagnosis Date  . Allergic rhinitis   . Anxiety and depression   . Arthritis    hands  . Asthma, persistent   . CAD (coronary artery disease)    per CT 06-02-2015  . Complication of anesthesia    slow to wake  . COPD, mild ([place])    pulmologist--  dr Vilinda Boehringer ([facility] in [place])  . Diet-controlled diabetes mellitus ([place]) 05/01/2011   Completed DSME 10/2015   . Diverticulosis of colon   . Essential hypertension   . GERD (gastroesophageal reflux disease)   . History of cellulitis    Nov 2015-- nasal cellulitis--  resolved  . History of endometriosis   . History of hiatal hernia   . Hyperlipidemia   . OSA on CPAP    severe per study 05-09-2010  . PONV (postoperative nausea and vomiting)   . Shingles 12/29/2006  . Sleep apnea   . Urothelial carcinoma ([place]) 2016   superficial papillary L lateral bladder wall s/p BCG therapy Junious Silk)    Patient Active Problem List   Diagnosis Date Noted  . Lip lesion 04/24/2017  . Advanced care  planning/counseling discussion 09/08/2015  . Urothelial carcinoma ([place])   . Right knee pain 01/03/2015  . Chronic cough 11/09/2014  . COPD, mild ([place]) 11/09/2014  . Left trigger finger 05/11/2014  . Estrogen deficiency 05/11/2014  . Lumbar back pain 03/19/2013  . Healthcare maintenance 09/24/2012  . Abnormal MRI, spine 04/23/2012  . Persistent asthma without complication 34/74/2595  . Arthritis 05/01/2011  . Controlled type 2 diabetes mellitus without complication, without long-term current use of insulin ([place]) 05/01/2011  . Obstructive sleep apnea 03/01/2010  . ALLERGIC RHINITIS 03/01/2010  . HYPERCHOLESTEROLEMIA 03/15/2008  . ANXIETY DEPRESSION 03/15/2008  . Essential hypertension 03/15/2008  . GERD 03/15/2008  . Herpes zoster 12/29/2006    Past Surgical History:  Procedure Laterality Date  . ABDOMINAL HYSTERECTOMY  1983   and Appendectomy  . CARDIOVASCULAR STRESS TEST  10-06-1998   no evidence ischemia or scar/  normal LV function and wall motion, ef 65%  . COLONOSCOPY  last one 02/ 2014  . CYSTOSCOPY WITH BIOPSY N/A 08/15/2015   Procedure: CYSTOSCOPY WITH BLADDER RESECTION;  Surgeon: Festus Aloe, MD;  [facility];  Service: Urology;  Laterality: N/A;  . ESOPHAGOGASTRODUODENOSCOPY  last one 08/ 2001  . LUMBAR LAMINECTOMY/DECOMPRESSION MICRODISCECTOMY  10/ 2013  . SEPTOPLASTY  03/  2002  . TRANSURETHRAL RESECTION OF BLADDER TUMOR N/A 07/11/2015   Procedure: TRANSURETHRAL RESECTION OF  BLADDER TUMOR (TURBT);  Surgeon: Festus Aloe, MD;  Location: [facility];  Service: Urology;  Laterality: N/A;  . TRANSURETHRAL RESECTION OF BLADDER TUMOR N/A 08/15/2015   Procedure: TRANSURETHRAL RESECTION OF BLADDER TUMOR (TURBT);  Surgeon: Festus Aloe, MD;  Location[facility];  Service: Urology;  Laterality: N/A;  . TRIGGER FINGER RELEASE Right 2014    Prior to Admission medications   Medication Sig Start Date End Date  Taking? Authorizing Provider  albuterol (PROVENTIL HFA;VENTOLIN HFA) 108 (90 Base) MCG/ACT inhaler Inhale 2 puffs into the lungs every 6 (six) hours as needed. For shortness of breath. 11/18/17   Ria Bush, MD  amLODipine (NORVASC) 10 MG tablet Take 1 tablet (10 mg total) by mouth daily. 11/18/17   Ria Bush, MD  atorvastatin (LIPITOR) 10 MG tablet Take 1 tablet (10 mg total) by mouth daily. 11/18/17   Ria Bush, MD  Blood Glucose Monitoring Suppl (ONE TOUCH ULTRA SYSTEM KIT) w/Device KIT 1 kit by Does not apply route once. 10/06/15   Ria Bush, MD  FLUoxetine (PROZAC) 10 MG capsule Take 1 capsule (10 mg total) by mouth daily. 11/18/17   Ria Bush, MD  fluticasone [facility]) 50 MCG/ACT nasal spray Place 2 sprays into both nostrils daily. 11/18/17   Ria Bush, MD  fluticasone furoate-vilanterol (BREO ELLIPTA) 100-25 MCG/INH AEPB Inhale 1 puff into the lungs daily. 03/31/18   Ria Bush, MD  gabapentin (NEURONTIN) 300 MG capsule Take 1 capsule (300 mg total) by mouth 2 (two) times daily. 07/27/18   Ria Bush, MD  glucose blood (ONE TOUCH ULTRA TEST) test strip 1 each by Other route daily. Use to check sugar once daily and as needed. Dx: E11.9 10/11/15   Ria Bush, MD  Lidocaine (ZTLIDO) 1.8 % PTCH Apply 1 patch topically daily. 08/21/18   Sable Feil, PA-C  loratadine (CLARITIN) 10 MG tablet TAKE 1 TABLET BY MOUTH ONCE DAILY 12/17/17   Ria Bush, MD  Multiple Vitamins-Calcium (VIACTIV MULTI-VITAMIN) CHEW Chew 1 tablet by mouth daily.    [provider]  omeprazole (PRILOSEC) 40 MG capsule Take one capsule daily 11/18/17   Ria Bush, MD  [facility] LANCETS FINE MISC Use as directed - check as needed or 3 times weekly 10/06/15   Ria Bush, MD  triamterene-hydrochlorothiazide (MAXZIDE-25) 37.5-25 MG tablet Take 1 tablet by mouth daily. 11/18/17   Ria Bush, MD  valACYclovir (VALTREX) 1000 MG tablet  Take 1 tablet (1,000 mg total) by mouth 3 (three) times daily. 07/27/18   Ria Bush, MD    Allergies Levofloxacin; Ampicillin; Doxycycline hyclate; Metoprolol succinate; Other; Tetracycline hcl; and Adhesive [tape]  Family History  Problem Relation Age of Onset  . Cancer Mother        kidney and lymphoma CA, neprectomy tumor of lung, non smoker  . Kidney disease Mother        T-cell cancer in kidney  . Aneurysm Father        ? in throat  . Diabetes Father   . Heart disease Father        MI  . Diabetes Sister   . Hyperlipidemia Sister   . Heart disease Brother 61       MI  . Alcohol abuse Brother        smokes again  . Hypertension Brother   . Cancer Brother 79       T-cell renal cancer/ removed  . Hypertension Brother   . Heart disease Maternal Aunt  MI  . Colon cancer Neg Hx   . Esophageal cancer Neg Hx   . Rectal cancer Neg Hx   . Stomach cancer Neg Hx   . Breast cancer Neg Hx     Social History Social History   Tobacco Use  . Smoking status: Former Smoker    Packs/day: 2.00    Years: 19.00    Pack years: 38.00    Types: Cigarettes    Last attempt to quit: 08/26/1986    Years since quitting: 32.0  . Smokeless tobacco: Never Used  Substance Use Topics  . Alcohol use: Yes    Comment: rarely  . Drug use: No    Review of Systems Constitutional: No fever/chills Eyes: No visual changes. ENT: No sore throat. Cardiovascular: Denies chest pain. Respiratory: Denies shortness of breath. Gastrointestinal: No abdominal pain.  No nausea, no vomiting.  No diarrhea.  No constipation. Genitourinary: Negative for dysuria. Musculoskeletal: Negative for back pain. Skin: Negative for rash. Neurological: Negative for headaches, focal weakness or numbness. Psychiatric:Anxiety and depression Endocrine:Diabetes, hyperlipidemia, and hypertension. Hematological/Lymphatic: Allergic/Immunilogical: See medication  list. ____________________________________________   PHYSICAL EXAM:  VITAL SIGNS: ED Triage Vitals  Enc Vitals Group     BP 08/21/18 0750 (!) 181/68     Pulse Rate 08/21/18 0750 66     Resp --      Temp 08/21/18 0750 97.8 F (36.6 C)     Temp Source 08/21/18 0750 Oral     SpO2 08/21/18 0750 98 %     Weight 08/21/18 0750 190 lb (86.2 kg)     Height 08/21/18 0750 '5\' 3"'$  (1.6 m)     Head Circumference --      Peak Flow --      Pain Score 08/21/18 0757 10     Pain Loc --      Pain Edu? --      Excl. in [place]? --    Constitutional: Alert and oriented. Well appearing and in no acute distress. Cardiovascular: Normal rate, regular rhythm. Grossly normal heart sounds.  Good peripheral circulation.  Elevated blood pressure. Respiratory: Normal respiratory effort.  No retractions. Lungs CTAB. Gastrointestinal: Soft and nontender. No distention. No abdominal bruits. No CVA tenderness. Musculoskeletal: No obvious deformity to the right shoulder.  Patient is moderate guarding palpation at the [facility] joint and the lateral humeral head.  Patient has decreased range of motion with abduction and overhead reaching.  No obvious deformity to the left knee.  Patient has an abrasion and a small ecchymotic lesion.  Mild guarding with palpation.  Patient is able to ambulate and without assistance.   Neurologic:  Normal speech and language. No gross focal neurologic deficits are appreciated. No gait instability. Skin:  Skin is warm, dry and intact. No rash noted. Psychiatric: Mood and affect are normal. Speech and behavior are normal.  ____________________________________________   LABS (all labs ordered are listed, but only abnormal results are displayed)  Labs Reviewed - No data to display ____________________________________________  EKG  EKG read by heart station Dr. with no acute findings. ____________________________________________  RADIOLOGY  ED MD interpretation:    Official radiology  report(s): Dg Shoulder Right  Result Date: 08/21/2018 CLINICAL DATA:  Pain following fall EXAM: RIGHT SHOULDER - 2+ VIEW COMPARISON:  None. FINDINGS: Oblique, Y scapular, and axillary images were obtained. There is no evident acute fracture or dislocation. There is moderate osteoarthritic change in the acromioclavicular joint. The glenohumeral joint appears unremarkable. No erosive change or intra-articular calcification. Visualized  right lung is clear. IMPRESSION: Osteoarthritic change in the acromioclavicular joint. No fracture or dislocation. Electronically Signed   By: Lowella Grip III M.D.   On: 08/21/2018 08:41    ____________________________________________   PROCEDURES  Procedure(s) performed: None  Procedures  Critical Care performed: No  ____________________________________________   INITIAL IMPRESSION / ASSESSMENT AND PLAN / ED COURSE  As part of my medical decision making, I reviewed the following data within the [place]   Patient presents with right shoulder pain secondary to a fall.  Discussed x-ray with patient revealing no acute findings of osteoarthritis.  Patient placed in a arm sling and given a Lidoderm patch.  Patient given discharge care instruction advised follow-up PCP.       ____________________________________________   FINAL CLINICAL IMPRESSION(S) / ED DIAGNOSES  Final diagnoses:  Acute pain of right shoulder     ED Discharge Orders         Ordered    Lidocaine (ZTLIDO) 1.8 % [facility]  Daily     08/21/18 0851           Note:  This document was prepared using Dragon voice recognition software and may include unintentional dictation errors.    Sable Feil, PA-C 08/21/18 0174    Earleen Newport, MD 08/21/18 1019

## 2018-08-21 NOTE — ED Notes (Signed)
First Nurse Note: Patient complaining of right shoulder pain that started on Monday 08/17/18.  Denies any other symptoms at this time.

## 2018-08-21 NOTE — Discharge Instructions (Signed)
Wear arm sling 3 to 5 days as needed while awake.

## 2018-08-21 NOTE — ED Triage Notes (Signed)
Pt states that she was bowling on Monday while bowling, injuring her left knee and right shoulder, pt states that as the days have passed her shoulder has cont to hurt worse

## 2018-08-21 NOTE — ED Notes (Signed)
See triage note   States she went bowling on Monday  States her foot got stuck  She fell landed on left knee and right shoulder  States she still has some swelling and bruising to left knee but thinks it is better  conts to have pain to right shoulder

## 2018-08-24 DIAGNOSIS — G4733 Obstructive sleep apnea (adult) (pediatric): Secondary | ICD-10-CM | POA: Diagnosis not present

## 2018-09-24 DIAGNOSIS — G4733 Obstructive sleep apnea (adult) (pediatric): Secondary | ICD-10-CM | POA: Diagnosis not present

## 2018-10-08 DIAGNOSIS — K130 Diseases of lips: Secondary | ICD-10-CM | POA: Diagnosis not present

## 2018-10-20 ENCOUNTER — Other Ambulatory Visit: Payer: Self-pay | Admitting: Family Medicine

## 2018-10-20 DIAGNOSIS — Z1231 Encounter for screening mammogram for malignant neoplasm of breast: Secondary | ICD-10-CM

## 2018-10-24 DIAGNOSIS — G4733 Obstructive sleep apnea (adult) (pediatric): Secondary | ICD-10-CM | POA: Diagnosis not present

## 2018-11-12 ENCOUNTER — Other Ambulatory Visit: Payer: Self-pay

## 2018-11-12 ENCOUNTER — Ambulatory Visit (INDEPENDENT_AMBULATORY_CARE_PROVIDER_SITE_OTHER): Payer: PPO | Admitting: Family Medicine

## 2018-11-12 ENCOUNTER — Encounter: Payer: Self-pay | Admitting: Family Medicine

## 2018-11-12 VITALS — BP 138/68 | HR 58 | Temp 98.4°F | Ht 64.0 in | Wt 189.0 lb

## 2018-11-12 DIAGNOSIS — R05 Cough: Secondary | ICD-10-CM | POA: Diagnosis not present

## 2018-11-12 DIAGNOSIS — R059 Cough, unspecified: Secondary | ICD-10-CM

## 2018-11-12 MED ORDER — GUAIFENESIN-CODEINE 100-10 MG/5ML PO SYRP: 5.0000 mL | ORAL_SOLUTION | Freq: Every evening | ORAL | 0 refills | Status: DC | PRN

## 2018-11-12 NOTE — Patient Instructions (Signed)
Rest, Fluids. Continue allergy meds. Can use cough suppressant at night as needed. Call if new fever or shortness of breath.

## 2018-11-12 NOTE — Progress Notes (Signed)
   Subjective:    Patient ID: Jennifer Pineda, female    DOB: [birth year], 68 y.o.   MRN: [id number]  Cough  This is a new problem. The current episode started 1 to 4 weeks ago (13 weeks ago). The problem has been gradually worsening. The cough is non-productive. Associated symptoms include headaches, nasal congestion, postnasal drip and a sore throat. Pertinent negatives include no chills, ear congestion, ear pain, fever or myalgias. Associated symptoms comments:  Facial pain. The symptoms are aggravated by lying down. Risk factors: nonsmoker. Treatments tried: theraflu, guafenesin. The treatment provided mild relief. Her past medical history is significant for asthma and environmental allergies.    On claritin and flonase and Breo.  Review of Systems  Constitutional: Negative for chills and fever.  HENT: Positive for postnasal drip and sore throat. Negative for ear pain.   Respiratory: Positive for cough.   Musculoskeletal: Negative for myalgias.  Allergic/Immunologic: Positive for environmental allergies.  Neurological: Positive for headaches.       Objective:   Physical Exam Constitutional:      General: She is not in acute distress.    Appearance: She is well-developed. She is not ill-appearing or toxic-appearing.  HENT:     Head: Normocephalic.     Right Ear: Hearing, tympanic membrane, ear canal and external ear normal. Tympanic membrane is not erythematous, retracted or bulging.     Left Ear: Hearing, tympanic membrane, ear canal and external ear normal. Tympanic membrane is not erythematous, retracted or bulging.     Nose: Mucosal edema and rhinorrhea present.     Right Sinus: No maxillary sinus tenderness or frontal sinus tenderness.     Left Sinus: No maxillary sinus tenderness or frontal sinus tenderness.     Mouth/Throat:     Pharynx: Uvula midline.  Eyes:     General: Lids are normal. Lids are everted, no foreign bodies appreciated.     Conjunctiva/sclera:  Conjunctivae normal.     Pupils: Pupils are equal, round, and reactive to light.  Neck:     Musculoskeletal: Normal range of motion and neck supple.     Thyroid: No thyroid mass or thyromegaly.     Vascular: No carotid bruit.     Trachea: Trachea normal.  Cardiovascular:     Rate and Rhythm: Normal rate and regular rhythm.     Pulses: Normal pulses.     Heart sounds: Normal heart sounds, S1 normal and S2 normal. No murmur. No friction rub. No gallop.   Pulmonary:     Effort: Pulmonary effort is normal. No tachypnea or respiratory distress.     Breath sounds: Normal breath sounds. No decreased breath sounds, wheezing, rhonchi or rales.  Skin:    General: Skin is warm and dry.     Findings: No rash.  Neurological:     Mental Status: She is alert.  Psychiatric:        Mood and Affect: Mood is not anxious or depressed.        Speech: Speech normal.        Behavior: Behavior normal. Behavior is cooperative.        Judgment: Judgment normal.           Assessment & Plan:

## 2018-11-13 ENCOUNTER — Ambulatory Visit: Payer: PPO

## 2018-11-19 NOTE — Assessment & Plan Note (Signed)
No evidence of COPD exacerbation. Low risk for COVID19 Most likely due to allergies.   Rest, Fluids. Continue allergy meds. Can use cough suppressant at night as needed. Call if new fever or shortness of breath.

## 2018-11-20 ENCOUNTER — Encounter: Payer: PPO | Admitting: Family Medicine

## 2018-12-14 ENCOUNTER — Other Ambulatory Visit: Payer: Self-pay | Admitting: Family Medicine

## 2018-12-24 DIAGNOSIS — G4733 Obstructive sleep apnea (adult) (pediatric): Secondary | ICD-10-CM | POA: Diagnosis not present

## 2019-01-11 ENCOUNTER — Other Ambulatory Visit: Payer: Self-pay | Admitting: Family Medicine

## 2019-01-23 DIAGNOSIS — G4733 Obstructive sleep apnea (adult) (pediatric): Secondary | ICD-10-CM | POA: Diagnosis not present

## 2019-01-29 ENCOUNTER — Other Ambulatory Visit: Payer: Self-pay

## 2019-01-29 ENCOUNTER — Ambulatory Visit
Admission: RE | Admit: 2019-01-29 | Discharge: 2019-01-29 | Disposition: A | Discharge status: Home / Self Care | Payer: PPO | Source: Ambulatory Visit | Attending: Family Medicine | Admitting: Family Medicine

## 2019-01-29 ENCOUNTER — Encounter: Payer: Self-pay | Admitting: Family Medicine

## 2019-01-29 DIAGNOSIS — Z1231 Encounter for screening mammogram for malignant neoplasm of breast: Secondary | ICD-10-CM | POA: Diagnosis not present

## 2019-01-29 LAB — HM MAMMOGRAPHY

## 2019-02-01 DIAGNOSIS — Z8551 Personal history of malignant neoplasm of bladder: Secondary | ICD-10-CM | POA: Diagnosis not present

## 2019-02-03 ENCOUNTER — Other Ambulatory Visit: Payer: Self-pay | Admitting: Family Medicine

## 2019-02-05 ENCOUNTER — Other Ambulatory Visit: Payer: Self-pay | Admitting: Family Medicine

## 2019-02-17 ENCOUNTER — Other Ambulatory Visit: Payer: Self-pay | Admitting: Family Medicine

## 2019-02-17 DIAGNOSIS — E7800 Pure hypercholesterolemia, unspecified: Secondary | ICD-10-CM

## 2019-02-17 DIAGNOSIS — E119 Type 2 diabetes mellitus without complications: Secondary | ICD-10-CM

## 2019-02-18 ENCOUNTER — Other Ambulatory Visit (INDEPENDENT_AMBULATORY_CARE_PROVIDER_SITE_OTHER): Payer: PPO

## 2019-02-18 ENCOUNTER — Ambulatory Visit (INDEPENDENT_AMBULATORY_CARE_PROVIDER_SITE_OTHER): Payer: PPO

## 2019-02-18 DIAGNOSIS — E119 Type 2 diabetes mellitus without complications: Secondary | ICD-10-CM

## 2019-02-18 DIAGNOSIS — Z0000 Encounter for general adult medical examination without abnormal findings: Secondary | ICD-10-CM | POA: Diagnosis not present

## 2019-02-18 DIAGNOSIS — E7800 Pure hypercholesterolemia, unspecified: Secondary | ICD-10-CM | POA: Diagnosis not present

## 2019-02-18 LAB — COMPREHENSIVE METABOLIC PANEL
ALT: 29 U/L (ref 0–35)
AST: 24 U/L (ref 0–37)
Albumin: 4.2 g/dL (ref 3.5–5.2)
Alkaline Phosphatase: 88 U/L (ref 39–117)
BUN: 12 mg/dL (ref 6–23)
CO2: 31 mEq/L (ref 19–32)
Calcium: 9.5 mg/dL (ref 8.4–10.5)
Chloride: 99 mEq/L (ref 96–112)
Creatinine, Ser: 0.73 mg/dL (ref 0.40–1.20)
GFR: 78.85 mL/min (ref >60.00)
Glucose, Bld: 119 mg/dL — ABNORMAL HIGH (ref 70–99)
Potassium: 4.4 mEq/L (ref 3.5–5.1)
Sodium: 138 mEq/L (ref 135–145)
Total Bilirubin: 0.6 mg/dL (ref 0.2–1.2)
Total Protein: 7.7 g/dL (ref 6.0–8.3)

## 2019-02-18 LAB — LIPID PANEL
Cholesterol: 184 mg/dL (ref 0–200)
HDL: 58.8 mg/dL (ref >39.00)
LDL Cholesterol: 89 mg/dL (ref 0–99)
NonHDL: 125.25
Total CHOL/HDL Ratio: 3
Triglycerides: 180 mg/dL — ABNORMAL HIGH (ref 0.0–149.0)
VLDL: 36 mg/dL (ref 0.0–40.0)

## 2019-02-18 LAB — MICROALBUMIN / CREATININE URINE RATIO
Creatinine,U: 100.5 mg/dL
Microalb Creat Ratio: 2.9 mg/g (ref 0.0–30.0)
Microalb, Ur: 2.9 mg/dL — ABNORMAL HIGH (ref 0.0–1.9)

## 2019-02-18 LAB — HEMOGLOBIN A1C: Hgb A1c MFr Bld: 6.7 % — ABNORMAL HIGH (ref 4.6–6.5)

## 2019-02-18 LAB — TSH: TSH: 1.99 u[IU]/mL (ref 0.35–4.50)

## 2019-02-18 NOTE — Progress Notes (Signed)
PCP notes:   Health maintenance:  Eye exam - to be determined A1C - completed Microalbumin - completed  Abnormal screenings:   None  Patient concerns:   None  Nurse concerns:  None  Next PCP appt:   02/23/19 @ 1415

## 2019-02-18 NOTE — Progress Notes (Signed)
Subjective:   Jennifer Pineda is a 68 y.o. female who presents for Medicare Annual (Subsequent) preventive examination.  Review of Systems:  N/A Cardiac Risk Factors include: advanced age (>34mn, >>32women);diabetes mellitus;dyslipidemia;hypertension;obesity (BMI >30kg/m2)     Objective:     Vitals: There were no vitals taken for this visit.  There is no height or weight on file to calculate BMI.  Advanced Directives 02/18/2019 08/21/2018 11/11/2017 09/30/2016 10/05/2015 08/15/2015 07/11/2015  Does Patient Have a Medical Advance Directive? _0  Yes Yes  Type of AParamedicof ABranford CenterLiving will HHasbrouck HeightsLiving will HMagnetic SpringsLiving will HClioLiving will Living will;Healthcare Power of [place]Living will  Does patient want to make changes to medical advance directive? - - - - No - Patient declined No - Patient declined No - Patient declined  Copy of HWindfall Cityin Chart? Yes - validated most recent copy scanned in chart (See row information) No - copy requested Yes No - copy requested - No - copy requested No - copy requested    Tobacco Social History   Tobacco Use  Smoking Status Former Smoker  . Packs/day: 2.00  . Years: 19.00  . Pack years: 38.00  . Types: Cigarettes  . Quit date: 08/26/1986  . Years since quitting: 32.5  Smokeless Tobacco Never Used     Counseling given: No   Clinical Intake:  Pre-visit preparation completed: Yes  Pain : No/denies pain Pain Score: 0-No pain     Nutritional Status: BMI > 30  Obese Nutritional Risks: None  How often do you need to have someone help you when you read instructions, pamphlets, or other written materials from your doctor or pharmacy?: 1 - Never What is the last grade level you completed in school?: Associate degree (2)  Interpreter Needed?: No   Comments: pt lives independently Information entered by :: LPinson, LPN  Past Medical History:  Diagnosis Date  . Allergic rhinitis   . Anxiety and depression   . Arthritis    hands  . Asthma, persistent   . CAD (coronary artery disease)    per CT 06-02-2015  . Complication of anesthesia    slow to wake  . COPD, mild ([place]le[place]    pulmologist--  dr vVilinda Boehringer([facility] [place]  . Diet-controlled diabetes mellitus ([place] 05/01/2011   Completed DSME 10/2015   . Diverticulosis of colon   . Essential hypertension   . GERD (gastroesophageal reflux disease)   . History of cellulitis    Nov 2015-- nasal cellulitis--  resolved  . History of endometriosis   . History of hiatal hernia   . Hyperlipidemia   . OSA on CPAP    severe per study 05-09-2010  . PONV (postoperative nausea and vomiting)   . Shingles 12/29/2006  . Sleep apnea   . Urothelial carcinoma ([place] Springs 2016   superficial papillary L lateral bladder wall s/p BCG therapy ([facility] Silk   Past Surgical History:  Procedure Laterality Date  . ABDOMINAL HYSTERECTOMY  1983   and Appendectomy  . CARDIOVASCULAR STRESS TEST  10-06-1998   no evidence ischemia or scar/  normal LV function and wall motion, ef 65%  . COLONOSCOPY  last one 02/ 2014  . CYSTOSCOPY WITH BIOPSY N/A 08/15/2015   Procedure: CYSTOSCOPY WITH BLADDER RESECTION;  Surgeon: MFestus Aloe MD;  [facility]  Service: Urology;  Laterality: N/A;  . ESOPHAGOGASTRODUODENOSCOPY  last one 08/ 2001  . LUMBAR LAMINECTOMY/DECOMPRESSION MICRODISCECTOMY  10/ 2013  . SEPTOPLASTY  03/  2002  . TRANSURETHRAL RESECTION OF BLADDER TUMOR N/A 07/11/2015   Procedure: TRANSURETHRAL RESECTION OF BLADDER TUMOR (TURBT);  Surgeon: Festus Aloe, MD;  Location: [facility];  Service: Urology;  Laterality: N/A;  . TRANSURETHRAL RESECTION OF BLADDER TUMOR N/A 08/15/2015   Procedure: TRANSURETHRAL RESECTION OF BLADDER TUMOR (TURBT);   Surgeon: Festus Aloe, MD;  Location: [facility];  Service: Urology;  Laterality: N/A;  . TRIGGER FINGER RELEASE Right 2014   Family History  Problem Relation Age of Onset  . Cancer Mother        kidney and lymphoma CA, neprectomy tumor of lung, non smoker  . Kidney disease Mother        T-cell cancer in kidney  . Aneurysm Father        ? in throat  . Diabetes Father   . Heart disease Father        MI  . Diabetes Sister   . Hyperlipidemia Sister   . Heart disease Brother 75       MI  . Alcohol abuse Brother        smokes again  . Hypertension Brother   . Cancer Brother 42       T-cell renal cancer/ removed  . Hypertension Brother   . Heart disease Maternal Aunt        MI  . Colon cancer Neg Hx   . Esophageal cancer Neg Hx   . Rectal cancer Neg Hx   . Stomach cancer Neg Hx   . Breast cancer Neg Hx    Social History   Socioeconomic History  . Marital status: Divorced    Spouse name: Not on file  . Number of children: 2  . Years of education: Not on file  . Highest education level: Not on file  Occupational History  . Occupation: Event organiser: [employer]    Comment: [place]  Social Needs  . Financial resource strain: Not on file  . Food insecurity    Worry: Not on file    Inability: Not on file  . Transportation needs    Medical: Not on file    Non-medical: Not on file  Tobacco Use  . Smoking status: Former Smoker    Packs/day: 2.00    Years: 19.00    Pack years: 38.00    Types: Cigarettes    Quit date: 08/26/1986    Years since quitting: 32.5  . Smokeless tobacco: Never Used  Substance and Sexual Activity  . Alcohol use: Yes    Comment: rarely  . Drug use: No  . Sexual activity: Never  Lifestyle  . Physical activity    Days per week: Not on file    Minutes per session: Not on file  . Stress: Not on file  Relationships  . Social Herbalist on phone: Not on file    Gets together: Not on file     Attends religious service: Not on file    Active member of club or organization: Not on file    Attends meetings of clubs or organizations: Not on file    Relationship status: Not on file  Other Topics Concern  . Not on file  Social History Narrative   Divorced   Daughter [name], lives with her   Children:  2 children // one daughter lives with mother/ son lives with father   Activity: 20 min walk daily   Diet: good water, fruits/vegetables daily.     Outpatient Encounter Medications as of 02/18/2019  Medication Sig  . albuterol (PROVENTIL HFA;VENTOLIN HFA) 108 (90 Base) MCG/ACT inhaler Inhale 2 puffs into the lungs every 6 (six) hours as needed. For shortness of breath.  Marland Kitchen amLODipine (NORVASC) 10 MG tablet Take 1 tablet by mouth once daily  . atorvastatin (LIPITOR) 10 MG tablet Take 1 tablet by mouth once daily  . Blood Glucose Monitoring Suppl (ONE TOUCH ULTRA SYSTEM KIT) w/Device KIT 1 kit by Does not apply route once.  Marland Kitchen FLUoxetine (PROZAC) 10 MG capsule Take 1 capsule by mouth once daily  . fluticasone (FLONASE) 50 MCG/ACT nasal spray Use 2 spray(s) in each nostril once daily  . fluticasone furoate-vilanterol (BREO ELLIPTA) 100-25 MCG/INH AEPB Inhale 1 puff into the lungs daily.  Marland Kitchen glucose blood (ONE TOUCH ULTRA TEST) test strip 1 each by Other route daily. Use to check sugar once daily and as needed. Dx: E11.9  . guaiFENesin-codeine (ROBITUSSIN AC) 100-10 MG/5ML syrup Take 5-10 mLs by mouth at bedtime as needed for cough.  . Lidocaine (ZTLIDO) 1.8 % PTCH Apply 1 patch topically daily.  Marland Kitchen loratadine (CLARITIN) 10 MG tablet TAKE 1 TABLET BY MOUTH ONCE DAILY  . Multiple Vitamins-Calcium (VIACTIV MULTI-VITAMIN) CHEW Chew 1 tablet by mouth daily.  Marland Kitchen omeprazole (PRILOSEC) 40 MG capsule Take 1 capsule by mouth once daily  . ONETOUCH DELICA LANCETS FINE MISC Use as directed - check as needed or 3 times weekly  . triamterene-hydrochlorothiazide (MAXZIDE-25) 37.5-25 MG tablet  Take 1 tablet by mouth once daily   No facility-administered encounter medications on file as of 02/18/2019.     Activities of Daily Living In your present state of health, do you have any difficulty performing the following activities: 02/18/2019  Hearing? N  Vision? N  Difficulty concentrating or making decisions? N  Walking or climbing stairs? N  Dressing or bathing? N  Doing errands, shopping? N  Preparing Food and eating ? N  Using the Toilet? N  In the past six months, have you accidently leaked urine? N  Do you have problems with loss of bowel control? N  Managing your Medications? N  Managing your Finances? N  Housekeeping or managing your Housekeeping? N  Some recent data might be hidden    Patient Care Team: Ria Bush, MD as PCP - General (Family Medicine) Arelia Sneddon, Gerald as Consulting Physician (Optometry) Festus Aloe, MD as Consulting Physician (Urology) Beverly Gust, MD as Consulting Physician (Otolaryngology) Vilinda Boehringer, MD (Inactive) as Consulting Physician (Internal Medicine) Meuth, Hadassah Pais as Physician Assistant (Physician Assistant)    Assessment:   This is a routine wellness examination for Patt.   Hearing Screening   _0  _1  _2  _3  _4  _5  _6  _7  _8   Right ear:           Left ear:           Vision Screening Comments: Vision exam in 2018   Exercise Activities and Dietary recommendations Current Exercise Habits: Home exercise routine, Type of exercise: walking, Time (Minutes): 20, Frequency (Times/Week): 7, Weekly Exercise (Minutes/Week): 140, Intensity: Mild, Exercise limited by: None identified  Goals    . Increase physical activity     Starting 02/18/2019, I will continue to walk for at least 20 minutes daily.        Fall Risk  Fall Risk  02/18/2019 11/11/2017 09/30/2016 10/30/2015 10/23/2015  Falls in the past year? 0 No No No (No Data)  Comment - - - - no falls since last visit     Depression Screen PHQ 2/9 Scores 02/18/2019 11/11/2017 09/30/2016 10/05/2015  PHQ - 2 Score 0 0 0 0  PHQ- 9 Score 0 0 - -     Cognitive Function MMSE - Mini Mental State Exam 02/18/2019 11/11/2017 09/30/2016  Orientation to time _0 Orientation to Place _1 Registration _2 Attention/ Calculation 0 0 0  Recall _3 Language- name 2 objects 0 0 0  Language- repeat _4 Language- follow 3 step command 0 3 3  Language- read & follow direction 0 0 0  Write a sentence 0 0 0  Copy design 0 0 0  Total score _5 PLEASE NOTE: A Mini-Cog screen was completed. Maximum score is 17. A value of 0 denotes this part of Folstein MMSE was not completed or the patient failed this part of the Mini-Cog screening.   Mini-Cog Screening Orientation to Time - Max 5 pts Orientation to Place - Max 5 pts Registration - Max 3 pts Recall - Max 3 pts Language Repeat - Max 1 pts      Immunization History  Administered Date(s) Administered  . Influenza, High Dose Seasonal PF 07/07/2017  . Influenza,inj,Quad PF,6+ Mos 09/08/2015, 10/07/2016, 05/20/2018  . Pneumococcal Conjugate-13 05/11/2014  . Pneumococcal Polysaccharide-23 09/08/2015  . Td 02/11/2004  . Tdap 05/11/2014  . Zoster 06/08/2014   Screening Tests Health Maintenance  Topic Date Due  . OPHTHALMOLOGY EXAM  08/26/2019 (Originally 07/31/2018)  . INFLUENZA VACCINE  03/27/2019  . FOOT EXAM  05/21/2019  . HEMOGLOBIN A1C  08/20/2019  . MAMMOGRAM  01/29/2020  . URINE MICROALBUMIN  02/18/2020  . COLONOSCOPY  10/14/2022  . DTaP/Tdap/Td (2 - Td) 05/11/2024  . TETANUS/TDAP  05/11/2024  . DEXA SCAN  Completed  . Hepatitis C Screening  Completed  . PNA vac Low Risk Adult  Completed      Plan:     I have personally reviewed, addressed, and noted the following in the patient's chart:  A. Medical and social history B. Use of alcohol, tobacco or illicit drugs  C. Current medications and supplements D. Functional ability and  status E.  Nutritional status F.  Physical activity G. Advance directives H. List of other physicians I.  Hospitalizations, surgeries, and ER visits in previous 12 months J.  Vitals (unless it is a telemedicine encounter) K. Screenings to include cognitive, depression, hearing, vision (NOTE: hearing and vision screenings not completed in telemedicine encounter) L. Referrals and appointments   In addition, I have reviewed and discussed with patient certain preventive protocols, quality metrics, and best practice recommendations. A written personalized care plan for preventive services and recommendations were provided to patient.  With patient's permission, we connected on 02/18/19 at 12:30 PM EDT. Interactive audio and video telecommunications were attempted with patient. This attempt was unsuccessful due to patient having technical difficulties OR patient did not have access to video capability.  Encounter was completed with audio only.  Two patient identifiers were used to ensure the encounter occurred with the correct person. Patient was in home and writer was in office.     Signed,   Lindell Noe, MHA, BS, RN Health Coach

## 2019-02-22 NOTE — Progress Notes (Signed)
I reviewed health advisor's note, was available for consultation, and agree with documentation and plan.  

## 2019-02-23 ENCOUNTER — Encounter: Payer: Self-pay | Admitting: Family Medicine

## 2019-02-23 ENCOUNTER — Ambulatory Visit (INDEPENDENT_AMBULATORY_CARE_PROVIDER_SITE_OTHER): Payer: PPO | Admitting: Family Medicine

## 2019-02-23 VITALS — BP 177/77 | HR 66 | Temp 98.0°F | Ht 64.0 in | Wt 184.1 lb

## 2019-02-23 DIAGNOSIS — E119 Type 2 diabetes mellitus without complications: Secondary | ICD-10-CM

## 2019-02-23 DIAGNOSIS — I10 Essential (primary) hypertension: Secondary | ICD-10-CM

## 2019-02-23 DIAGNOSIS — Z8551 Personal history of malignant neoplasm of bladder: Secondary | ICD-10-CM | POA: Diagnosis not present

## 2019-02-23 DIAGNOSIS — E7800 Pure hypercholesterolemia, unspecified: Secondary | ICD-10-CM | POA: Diagnosis not present

## 2019-02-23 DIAGNOSIS — Z0000 Encounter for general adult medical examination without abnormal findings: Secondary | ICD-10-CM

## 2019-02-23 DIAGNOSIS — G4733 Obstructive sleep apnea (adult) (pediatric): Secondary | ICD-10-CM | POA: Diagnosis not present

## 2019-02-23 NOTE — Progress Notes (Signed)
Virtual visit completed through [employer]. Due to national recommendations of social distancing due to COVID-19, a virtual visit is felt to be most appropriate for this patient at this time. Reviewed limitations of a virtual visit.   Patient location: home Provider location: [place] at [facility], office If any vitals were documented, they were collected by patient at home unless specified below.    BP (!) 177/77   Pulse 66   Temp 98 F (36.7 C)   Ht _0  (1.626 m)   Wt 184 lb 2 oz (83.5 kg)   BMI 31.60 kg/m   BP Readings from Last 3 Encounters:  02/23/19 (!) 177/77  11/12/18 138/68  08/21/18 (!) 181/68    CC: CPE Subjective:    Patient ID: Jennifer Pineda, female    DOB: 1951/08/11, 68 y.o.   MRN: [id number]  HPI: Jennifer Pineda is a 68 y.o. female presenting on 02/23/2019 for Annual Exam (Pt 2. )   Saw Katha Cabal last week for medicare wellness visit. Note reviewed.  H/o bladder cancer s/p TURBT 07/2015. Sees urology yearly.   HTN - BP elevated despite amlodipine 35m and maxzide daily. Attributes to increased stress - [facility] [place]. Prior to family stress BP has been well controlled.   Laid off of work in March.   Preventative: COLONOSCOPY Date: 09/2012 small int hem (Carlean Purl rpt 10 yrs Last pap was 2010, all normal. Hysterectomy age 68yo Ovaries remain.  Mammogram - 01/2019 WNL. DEXA 06/2014 - WNL Flu -yearly prevnar 2015, pneumovax1/2017 Td 2005, Tdap 2015 zostavax - 2015 shingrix - discussed - to check with pharmacy.  Advanced directives - in chart 08/2015. Would want son ([name] to be HCPOA. No prolonged life support if terminal condition. Full code, ok with temporary ventilation.  Seat belt use discussed Sunscreen use discussed. No changing moles on skin.one mole on back - itching. Planning to see dermatologist.  Ex smoker - [place] Eye exam q2 yrs Dentist q6 mo Bowel - no  constipation Bladder - no incontinence  Divorced Daughter TEarmon Phoenix lives with her Children: 2 children // one daughter lives with mother/ son lives with father Activity: 20 min walk daily Diet: good water, fruits/vegetables daily.     Relevant past medical, surgical, family and social history reviewed and updated as indicated. Interim medical history since our last visit reviewed. Allergies and medications reviewed and updated. Outpatient Medications Prior to Visit  Medication Sig Dispense Refill  . albuterol (PROVENTIL HFA;VENTOLIN HFA) 108 (90 Base) MCG/ACT inhaler Inhale 2 puffs into the lungs every 6 (six) hours as needed. For shortness of breath. 54 Inhaler 3  . amLODipine (NORVASC) 10 MG tablet Take 1 tablet by mouth once daily 90 tablet 0  . atorvastatin (LIPITOR) 10 MG tablet Take 1 tablet by mouth once daily 90 tablet 0  . Blood Glucose Monitoring Suppl (ONE TOUCH ULTRA SYSTEM KIT) w/Device KIT 1 kit by Does not apply route once. 1 each 0  . FLUoxetine (PROZAC) 10 MG capsule Take 1 capsule by mouth once daily 90 capsule 0  . fluticasone (FLONASE) 50 MCG/ACT nasal spray Use 2 spray(s) in each nostril once daily 48 g 0  . fluticasone furoate-vilanterol (BREO ELLIPTA) 100-25 MCG/INH AEPB Inhale 1 puff into the lungs daily. 90 each 3  . glucose blood (ONE TOUCH ULTRA TEST) test strip 1 each by Other route daily. Use to check sugar once daily and  as needed. Dx: E11.9 100 each 3  . guaiFENesin-codeine (ROBITUSSIN AC) 100-10 MG/5ML syrup Take 5-10 mLs by mouth at bedtime as needed for cough. 180 mL 0  . Lidocaine (ZTLIDO) 1.8 % PTCH Apply 1 patch topically daily. 30 patch 0  . loratadine (CLARITIN) 10 MG tablet TAKE 1 TABLET BY MOUTH ONCE DAILY 30 tablet 11  . Multiple Vitamins-Calcium (VIACTIV MULTI-VITAMIN) CHEW Chew 1 tablet by mouth daily.    Marland Kitchen omeprazole (PRILOSEC) 40 MG capsule Take 1 capsule by mouth once daily 90 capsule 0  . ONETOUCH DELICA LANCETS FINE MISC Use as  directed - check as needed or 3 times weekly 100 each 1  . triamterene-hydrochlorothiazide (MAXZIDE-25) 37.5-25 MG tablet Take 1 tablet by mouth once daily 90 tablet 0   No facility-administered medications prior to visit.      Per HPI unless specifically indicated in ROS section below Review of Systems  Constitutional: Negative for activity change, appetite change, chills, fatigue, fever and unexpected weight change.  HENT: Positive for congestion and sinus pressure. Negative for hearing loss.   Eyes: Negative for visual disturbance.  Respiratory: Negative for cough, chest tightness, shortness of breath and wheezing.   Cardiovascular: Negative for chest pain, palpitations and leg swelling.  Gastrointestinal: Negative for abdominal distention, abdominal pain, blood in stool, constipation, diarrhea, nausea and vomiting.  Genitourinary: Negative for difficulty urinating and hematuria.  Musculoskeletal: Negative for arthralgias, myalgias and neck pain.  Skin: Negative for rash.  Neurological: Negative for dizziness, seizures, syncope and headaches.  Hematological: Negative for adenopathy. Does not bruise/bleed easily.  Psychiatric/Behavioral: Negative for dysphoric mood. The patient is nervous/anxious (family stress).    Objective:    BP (!) 177/77   Pulse 66   Temp 98 F (36.7 C)   Ht _0  (1.626 m)   Wt 184 lb 2 oz (83.5 kg)   BMI 31.60 kg/m   Wt Readings from Last 3 Encounters:  02/23/19 184 lb 2 oz (83.5 kg)  11/12/18 189 lb (85.7 kg)  08/21/18 190 lb (86.2 kg)     Physical exam: Gen: alert, NAD, not ill appearing Pulm: speaks in complete sentences without increased work of breathing Psych: normal mood, normal thought content      Results for orders placed or performed in visit on 02/18/19  TSH  Result Value Ref Range   TSH 1.99 0.35 - 4.50 uIU/mL  Microalbumin / creatinine urine ratio  Result Value Ref Range   Microalb, Ur 2.9 (H) 0.0 - 1.9 mg/dL   Creatinine,U  100.5 mg/dL   Microalb Creat Ratio 2.9 0.0 - 30.0 mg/g  Hemoglobin A1c  Result Value Ref Range   Hgb A1c MFr Bld 6.7 (H) 4.6 - 6.5 %  Comprehensive metabolic panel  Result Value Ref Range   Sodium 138 135 - 145 mEq/L   Potassium 4.4 3.5 - 5.1 mEq/L   Chloride 99 96 - 112 mEq/L   CO2 31 19 - 32 mEq/L   Glucose, Bld 119 (H) 70 - 99 mg/dL   BUN 12 6 - 23 mg/dL   Creatinine, Ser 0.73 0.40 - 1.20 mg/dL   Total Bilirubin 0.6 0.2 - 1.2 mg/dL   Alkaline Phosphatase 88 39 - 117 U/L   AST 24 0 - 37 U/L   ALT 29 0 - 35 U/L   Total Protein 7.7 6.0 - 8.3 g/dL   Albumin 4.2 3.5 - 5.2 g/dL   Calcium 9.5 8.4 - 10.5 mg/dL   GFR 78.85 >60.00 mL/min  Lipid panel  Result Value Ref Range   Cholesterol 184 0 - 200 mg/dL   Triglycerides 180.0 (H) 0.0 - 149.0 mg/dL   HDL 58.80 >39.00 mg/dL   VLDL 36.0 0.0 - 40.0 mg/dL   LDL Cholesterol 89 0 - 99 mg/dL   Total CHOL/HDL Ratio 3    NonHDL 125.25    Assessment & Plan:   Problem List Items Addressed This Visit    HYPERCHOLESTEROLEMIA    Chronic, stable on lipitor. The 10-year ASCVD risk score Mikey Bussing DC Brooke Bonito., et al., 2013) is: 34.9%   Values used to calculate the score:     Age: 42 years     Sex: Female     Is Non-Hispanic African American: No     Diabetic: Yes     Tobacco smoker: No     Systolic Blood Pressure: 037 mmHg     Is BP treated: Yes     HDL Cholesterol: 58.8 mg/dL     Total Cholesterol: 184 mg/dL       History of bladder cancer    H/o urothelial carcinoma, in remission by urology.       Healthcare maintenance - Primary    Preventative protocols reviewed and updated unless pt declined. Discussed healthy diet and lifestyle.       Essential hypertension    Chronic, elevated readings today. Pt endorses good control at home. Continue antihypertensives. I did ask her to start monitoring blood pressures more closely at home and keep log- then in 2 weeks notify me with readings.       Controlled type 2 diabetes mellitus without  complication, without long-term current use of insulin (HCC)    Chronic, stable. Continue diet control.  Reassess at 4-6 mo f/u visit.           No orders of the defined types were placed in this encounter.  No orders of the defined types were placed in this encounter.   I discussed the assessment and treatment plan with the patient. The patient was provided an opportunity to ask questions and all were answered. The patient agreed with the plan and demonstrated an understanding of the instructions. The patient was advised to call back or seek an in-person evaluation if the symptoms worsen or if the condition fails to improve as anticipated.  Follow up plan: Return in about 1 year (around 02/23/2020) for medicare wellness visit, annual exam, prior fasting for blood work.  Ria Bush, MD

## 2019-02-23 NOTE — Assessment & Plan Note (Signed)
H/o urothelial carcinoma, in remission by urology.

## 2019-02-23 NOTE — Assessment & Plan Note (Signed)
Preventative protocols reviewed and updated unless pt declined. Discussed healthy diet and lifestyle.  

## 2019-02-23 NOTE — Assessment & Plan Note (Addendum)
Chronic, stable. Continue diet control.  Reassess at 4-6 mo f/u visit.

## 2019-02-23 NOTE — Assessment & Plan Note (Signed)
Chronic, stable on lipitor. The 10-year ASCVD risk score Mikey Bussing DC Brooke Bonito., et al., 2013) is: 34.9%   Values used to calculate the score:     Age: 68 years     Sex: Female     Is Non-Hispanic African American: No     Diabetic: Yes     Tobacco smoker: No     Systolic Blood Pressure: 131 mmHg     Is BP treated: Yes     HDL Cholesterol: 58.8 mg/dL     Total Cholesterol: 184 mg/dL

## 2019-02-23 NOTE — Assessment & Plan Note (Signed)
Chronic, elevated readings today. Pt endorses good control at home. Continue antihypertensives. I did ask her to start monitoring blood pressures more closely at home and keep log- then in 2 weeks notify me with readings.

## 2019-03-25 DIAGNOSIS — G4733 Obstructive sleep apnea (adult) (pediatric): Secondary | ICD-10-CM | POA: Diagnosis not present

## 2019-03-28 ENCOUNTER — Other Ambulatory Visit: Payer: Self-pay | Admitting: Family Medicine

## 2019-04-12 ENCOUNTER — Other Ambulatory Visit: Payer: Self-pay | Admitting: Family Medicine

## 2019-05-03 ENCOUNTER — Other Ambulatory Visit: Payer: Self-pay | Admitting: Family Medicine

## 2019-06-16 DIAGNOSIS — G4733 Obstructive sleep apnea (adult) (pediatric): Secondary | ICD-10-CM | POA: Diagnosis not present

## 2019-06-25 ENCOUNTER — Ambulatory Visit: Payer: PPO | Admitting: Family Medicine

## 2019-07-02 ENCOUNTER — Telehealth (INDEPENDENT_AMBULATORY_CARE_PROVIDER_SITE_OTHER): Payer: PPO | Admitting: Family Medicine

## 2019-07-02 ENCOUNTER — Ambulatory Visit: Payer: PPO | Admitting: Family Medicine

## 2019-07-02 ENCOUNTER — Encounter: Payer: Self-pay | Admitting: Family Medicine

## 2019-07-02 VITALS — BP 142/77 | HR 60 | Temp 97.7°F | Ht 64.0 in | Wt 186.0 lb

## 2019-07-02 DIAGNOSIS — I10 Essential (primary) hypertension: Secondary | ICD-10-CM | POA: Diagnosis not present

## 2019-07-02 DIAGNOSIS — J019 Acute sinusitis, unspecified: Secondary | ICD-10-CM

## 2019-07-02 DIAGNOSIS — E119 Type 2 diabetes mellitus without complications: Secondary | ICD-10-CM

## 2019-07-02 MED ORDER — AMOXICILLIN 875 MG PO TABS: 875.0000 mg | ORAL_TABLET | Freq: Two times a day (BID) | ORAL | 0 refills | Status: DC

## 2019-07-02 MED ORDER — FLUTICASONE PROPIONATE 50 MCG/ACT NA SUSP: NASAL | 1 refills | Status: DC

## 2019-07-02 MED ORDER — LISINOPRIL 5 MG PO TABS: 5.0000 mg | ORAL_TABLET | Freq: Every day | ORAL | 1 refills | Status: DC

## 2019-07-02 NOTE — Assessment & Plan Note (Addendum)
Chronic, remains uncontrolled. Will start lisinopril 5mg  daily. Check Cr in 2 wks.

## 2019-07-02 NOTE — Assessment & Plan Note (Signed)
Anticipate bacterial sinusitis given progression and duration of symptoms. Treat with amoxicillin 7d course. Discussed allergy regimen - she will also switch antihistamines.

## 2019-07-02 NOTE — Progress Notes (Signed)
Virtual visit completed through HTN f/u visit. Due to national recommendations of social distancing due to COVID-19, a virtual visit is felt to be most appropriate for this patient at this time. Reviewed limitations of a virtual visit.   Patient location: home Provider location: [place] at [facility], office If any vitals were documented, they were collected by patient at home unless specified below.    BP (!) 142/77   Pulse 60   Temp 97.7 F (36.5 C)   Ht '5\' 4"'  (1.626 m)   Wt 186 lb (84.4 kg)   BMI 31.93 kg/m    CC: HTN f/u visit Subjective:    Patient ID: Jennifer Pineda, female    DOB: 1950/11/29, 68 y.o.   MRN: [id number]  HPI: Jennifer Pineda is a 68 y.o. female presenting on 07/02/2019 for Hypertension (4 mo f/u.)   See prior note for details. At prior physical she did have elevated blood pressure readings despite maxzide and amlodipine 64m daily. I asked her to monitor BP more closely at home - she has omron cuff with log and BP has been running 150-160/70s. No HA, vision changes, CP/tightness, SOB, leg swelling.   DM - diet controlled.   Some R >L sinus pressure, mild R nose bleed, maxillary facial pain, and PNDrainage over the past 2+ weeks. She continues flonase as well as neti pot. She takes guaifenesin daily.      Relevant past medical, surgical, family and social history reviewed and updated as indicated. Interim medical history since our last visit reviewed. Allergies and medications reviewed and updated. Outpatient Medications Prior to Visit  Medication Sig Dispense Refill  . albuterol (PROVENTIL HFA;VENTOLIN HFA) 108 (90 Base) MCG/ACT inhaler Inhale 2 puffs into the lungs every 6 (six) hours as needed. For shortness of breath. 54 Inhaler 3  . amLODipine (NORVASC) 10 MG tablet Take 1 tablet by mouth once daily 90 tablet 3  . atorvastatin (LIPITOR) 10 MG tablet Take 1 tablet by mouth once daily 90 tablet 3  . Blood Glucose Monitoring Suppl (ONE TOUCH ULTRA  SYSTEM KIT) w/Device KIT 1 kit by Does not apply route once. 1 each 0  . BREO ELLIPTA 100-25 MCG/INH AEPB Inhale 1 puff by mouth once daily 90 each 3  . FLUoxetine (PROZAC) 10 MG capsule Take 1 capsule by mouth once daily 90 capsule 3  . glucose blood (ONE TOUCH ULTRA TEST) test strip 1 each by Other route daily. Use to check sugar once daily and as needed. Dx: E11.9 100 each 3  . guaiFENesin-codeine (ROBITUSSIN AC) 100-10 MG/5ML syrup Take 5-10 mLs by mouth at bedtime as needed for cough. 180 mL 0  . Lidocaine (ZTLIDO) 1.8 % PTCH Apply 1 patch topically daily. 30 patch 0  . loratadine (CLARITIN) 10 MG tablet TAKE 1 TABLET BY MOUTH ONCE DAILY 30 tablet 11  . Multiple Vitamins-Calcium (VIACTIV MULTI-VITAMIN) CHEW Chew 1 tablet by mouth daily.    .Marland Kitchenomeprazole (PRILOSEC) 40 MG capsule Take 1 capsule by mouth once daily 90 capsule 2  . ONETOUCH DELICA LANCETS FINE MISC Use as directed - check as needed or 3 times weekly 100 each 1  . triamterene-hydrochlorothiazide (MAXZIDE-25) 37.5-25 MG tablet Take 1 tablet by mouth once daily 90 tablet 3  . fluticasone (FLONASE) 50 MCG/ACT nasal spray Use 2 spray(s) in each nostril once daily 48 g 0   No facility-administered medications prior to visit.      Per HPI unless specifically indicated in ROS section  below Review of Systems Objective:    BP (!) 142/77   Pulse 60   Temp 97.7 F (36.5 C)   Ht '5\' 4"'  (1.626 m)   Wt 186 lb (84.4 kg)   BMI 31.93 kg/m   Wt Readings from Last 3 Encounters:  07/02/19 186 lb (84.4 kg)  02/23/19 184 lb 2 oz (83.5 kg)  11/12/18 189 lb (85.7 kg)     Physical exam: Gen: alert, NAD, not ill appearing Pulm: speaks in complete sentences without increased work of breathing Psych: normal mood, normal thought content      Results for orders placed or performed in visit on 02/18/19  TSH  Result Value Ref Range   TSH 1.99 0.35 - 4.50 uIU/mL  Microalbumin / creatinine urine ratio  Result Value Ref Range   Microalb, Ur  2.9 (H) 0.0 - 1.9 mg/dL   Creatinine,U 100.5 mg/dL   Microalb Creat Ratio 2.9 0.0 - 30.0 mg/g  Hemoglobin A1c  Result Value Ref Range   Hgb A1c MFr Bld 6.7 (H) 4.6 - 6.5 %  Comprehensive metabolic panel  Result Value Ref Range   Sodium 138 135 - 145 mEq/L   Potassium 4.4 3.5 - 5.1 mEq/L   Chloride 99 96 - 112 mEq/L   CO2 31 19 - 32 mEq/L   Glucose, Bld 119 (H) 70 - 99 mg/dL   BUN 12 6 - 23 mg/dL   Creatinine, Ser 0.73 0.40 - 1.20 mg/dL   Total Bilirubin 0.6 0.2 - 1.2 mg/dL   Alkaline Phosphatase 88 39 - 117 U/L   AST 24 0 - 37 U/L   ALT 29 0 - 35 U/L   Total Protein 7.7 6.0 - 8.3 g/dL   Albumin 4.2 3.5 - 5.2 g/dL   Calcium 9.5 8.4 - 10.5 mg/dL   GFR 78.85 >60.00 mL/min  Lipid panel  Result Value Ref Range   Cholesterol 184 0 - 200 mg/dL   Triglycerides 180.0 (H) 0.0 - 149.0 mg/dL   HDL 58.80 >39.00 mg/dL   VLDL 36.0 0.0 - 40.0 mg/dL   LDL Cholesterol 89 0 - 99 mg/dL   Total CHOL/HDL Ratio 3    NonHDL 125.25    Assessment & Plan:   Problem List Items Addressed This Visit    Essential hypertension - Primary    Chronic, remains uncontrolled. Will start lisinopril 92m daily. Check Cr in 2 wks.       Relevant Medications   lisinopril (ZESTRIL) 5 MG tablet   Controlled type 2 diabetes mellitus without complication, without long-term current use of insulin (HCC)    Chronic, stable. Update A1c when she returns for labs later this month.       Relevant Medications   lisinopril (ZESTRIL) 5 MG tablet   Other Relevant Orders   Basic metabolic panel   Hemoglobin A1c   Acute sinusitis    Anticipate bacterial sinusitis given progression and duration of symptoms. Treat with amoxicillin 7d course. Discussed allergy regimen - she will also switch antihistamines.       Relevant Medications   fluticasone (FLONASE) 50 MCG/ACT nasal spray   amoxicillin (AMOXIL) 875 MG tablet       Meds ordered this encounter  Medications  . fluticasone (FLONASE) 50 MCG/ACT nasal spray     Sig: Use 2 spray(s) in each nostril once daily    Dispense:  48 g    Refill:  1  . amoxicillin (AMOXIL) 875 MG tablet    Sig: Take 1  tablet (875 mg total) by mouth 2 (two) times daily.    Dispense:  14 tablet    Refill:  0  . lisinopril (ZESTRIL) 5 MG tablet    Sig: Take 1 tablet (5 mg total) by mouth daily.    Dispense:  90 tablet    Refill:  1   Orders Placed This Encounter  Procedures  . Basic metabolic panel    Standing Status:   Future    Standing Expiration Date:   07/01/2020  . Hemoglobin A1c    Standing Status:   Future    Standing Expiration Date:   07/01/2020    I discussed the assessment and treatment plan with the patient. The patient was provided an opportunity to ask questions and all were answered. The patient agreed with the plan and demonstrated an understanding of the instructions. The patient was advised to call back or seek an in-person evaluation if the symptoms worsen or if the condition fails to improve as anticipated.  Follow up plan: Return if symptoms worsen or fail to improve.  Ria Bush, MD

## 2019-07-02 NOTE — Assessment & Plan Note (Signed)
Chronic, stable. Update A1c when she returns for labs later this month.

## 2019-07-14 ENCOUNTER — Other Ambulatory Visit (INDEPENDENT_AMBULATORY_CARE_PROVIDER_SITE_OTHER): Payer: PPO

## 2019-07-14 ENCOUNTER — Other Ambulatory Visit: Payer: Self-pay

## 2019-07-14 DIAGNOSIS — E119 Type 2 diabetes mellitus without complications: Secondary | ICD-10-CM

## 2019-07-14 LAB — BASIC METABOLIC PANEL
BUN: 15 mg/dL (ref 6–23)
CO2: 29 mEq/L (ref 19–32)
Calcium: 9.4 mg/dL (ref 8.4–10.5)
Chloride: 99 mEq/L (ref 96–112)
Creatinine, Ser: 0.75 mg/dL (ref 0.40–1.20)
GFR: 76.34 mL/min (ref >60.00)
Glucose, Bld: 159 mg/dL — ABNORMAL HIGH (ref 70–99)
Potassium: 4 mEq/L (ref 3.5–5.1)
Sodium: 138 mEq/L (ref 135–145)

## 2019-07-14 LAB — HEMOGLOBIN A1C: Hgb A1c MFr Bld: 6.6 % — ABNORMAL HIGH (ref 4.6–6.5)

## 2019-07-28 ENCOUNTER — Telehealth: Payer: Self-pay | Admitting: *Deleted

## 2019-07-28 MED ORDER — AMOXICILLIN-POT CLAVULANATE 875-125 MG PO TABS: 1.0000 | ORAL_TABLET | Freq: Two times a day (BID) | ORAL | 0 refills | Status: AC

## 2019-07-28 NOTE — Telephone Encounter (Signed)
Patient called stating that she did a visit with Dr. Danise Mina about 3 weeks ago. Patient stated that she is still having problems with her sinuses. Patient stated that she has a lot of sinus drainage, sneezing, head congestion that is yellow/green. Patient stated that she has tried everything over the counter and nothing has helped. Patient stated that she has not been running a fever. Patient stated that she was not given a prescription for this at the visit. Pharmacy [facility]

## 2019-07-28 NOTE — Telephone Encounter (Signed)
We previously treated with amoxicillin 7d course. Any better while on antibiotic? Recommend we broaden treatment to augmentin 10 d course sent to pharmacy. Let us know if not fully improved after treatment.

## 2019-07-29 NOTE — Telephone Encounter (Signed)
Spoke with pt relaying Dr. Synthia Innocent message.  Verbalizes understanding and states she never got the original abx.  Says she will pick up rx today.

## 2019-08-18 DIAGNOSIS — B379 Candidiasis, unspecified: Secondary | ICD-10-CM | POA: Diagnosis not present

## 2019-08-18 DIAGNOSIS — J340 Abscess, furuncle and carbuncle of nose: Secondary | ICD-10-CM | POA: Diagnosis not present

## 2019-09-21 ENCOUNTER — Ambulatory Visit: Payer: PPO | Attending: Internal Medicine

## 2019-09-21 DIAGNOSIS — Z20822 Contact with and (suspected) exposure to covid-19: Secondary | ICD-10-CM

## 2019-09-22 DIAGNOSIS — G4733 Obstructive sleep apnea (adult) (pediatric): Secondary | ICD-10-CM | POA: Diagnosis not present

## 2019-09-22 LAB — NOVEL CORONAVIRUS, NAA: SARS-CoV-2, NAA: DETECTED — AB

## 2019-09-24 ENCOUNTER — Encounter: Payer: Self-pay | Admitting: Family Medicine

## 2019-09-24 ENCOUNTER — Telehealth: Payer: Self-pay

## 2019-09-24 ENCOUNTER — Ambulatory Visit (INDEPENDENT_AMBULATORY_CARE_PROVIDER_SITE_OTHER): Payer: PPO | Admitting: Family Medicine

## 2019-09-24 VITALS — BP 147/74 | HR 75 | Temp 98.9°F | Ht 64.0 in | Wt 181.0 lb

## 2019-09-24 DIAGNOSIS — U071 COVID-19: Secondary | ICD-10-CM

## 2019-09-24 DIAGNOSIS — J4530 Mild persistent asthma, uncomplicated: Secondary | ICD-10-CM

## 2019-09-24 HISTORY — DX: COVID-19: U07.1

## 2019-09-24 MED ORDER — PREDNISONE 20 MG PO TABS: ORAL_TABLET | ORAL | 0 refills | Status: DC

## 2019-09-24 NOTE — Telephone Encounter (Signed)
[place] Night - Client Nonclinical Telephone Record AccessNurse Client [facility] Night - Client Client Site [place] Physician Ria Bush - MD Contact Type Call Who Is Calling Patient / Member / Family / Caregiver Caller Name Tylor Bruni [place] Phone Number [phone] Patient Name Jennifer Pineda Patient DOB 03-19-1951 and 08/11/1922 Call Type Message Only Information Provided Reason for Call Request to Schedule Office Appointment Initial Comment Caller states she is needing to schedule an appt for today if possible, and asks for a call back. Fever: 101.0, step father who is sick, flu symptoms, and wondering what to do. Step father is not eating much, sleeping most of the day, and wanting an appt for him as well. Refused triage. Additional Comment Provided information for a call back from the office. Disp. Time Disposition Final User 09/24/2019 7:20:12 AM General Information Provided Yes Rosana Fret Call Closed By: Rosana Fret Transaction Date/Time: 09/24/2019 7:16:56 AM (ET)

## 2019-09-24 NOTE — Telephone Encounter (Signed)
Noted  

## 2019-09-24 NOTE — Patient Instructions (Signed)
Person Under Monitoring Name: Jennifer Pineda  Location: 2012 [place] [id number]   Infection Prevention Recommendations for Individuals Confirmed to have, or Being Evaluated for, 2019 Novel Coronavirus (COVID-19) Infection Who Receive Care at Home  Individuals who are confirmed to have, or are being evaluated for, COVID-19 should follow the prevention steps below until a healthcare provider or local or state health department says they can return to normal activities.  Stay home except to get medical care You should restrict activities outside your home, except for getting medical care. Do not go to work, school, or public areas, and do not use public transportation or taxis.  Call ahead before visiting your doctor Before your medical appointment, call the healthcare provider and tell them that you have, or are being evaluated for, COVID-19 infection. This will help the healthcare provider's office take steps to keep other people from getting infected. Ask your healthcare provider to call the local or state health department.  Monitor your symptoms Seek prompt medical attention if your illness is worsening (e.g., difficulty breathing). Before going to your medical appointment, call the healthcare provider and tell them that you have, or are being evaluated for, COVID-19 infection. Ask your healthcare provider to call the local or state health department.  Wear a facemask You should wear a facemask that covers your nose and mouth when you are in the same room with other people and when you visit a healthcare provider. People who live with or visit you should also wear a facemask while they are in the same room with you.  Separate yourself from other people in your home As much as possible, you should stay in a different room from other people in your home. Also, you should use a separate bathroom, if available.  Avoid sharing household items You should not share  dishes, drinking glasses, cups, eating utensils, towels, bedding, or other items with other people in your home. After using these items, you should wash them thoroughly with soap and water.  Cover your coughs and sneezes Cover your mouth and nose with a tissue when you cough or sneeze, or you can cough or sneeze into your sleeve. Throw used tissues in a lined trash can, and immediately wash your hands with soap and water for at least 20 seconds or use an alcohol-based hand rub.  Wash your [facility] your hands often and thoroughly with soap and water for at least 20 seconds. You can use an alcohol-based hand sanitizer if soap and water are not available and if your hands are not visibly dirty. Avoid touching your eyes, nose, and mouth with unwashed hands.   Prevention Steps for Caregivers and Household Members of Individuals Confirmed to have, or Being Evaluated for, COVID-19 Infection Being Cared for in the Home  If you live with, or provide care at home for, a person confirmed to have, or being evaluated for, COVID-19 infection please follow these guidelines to prevent infection:  Follow healthcare provider's instructions Make sure that you understand and can help the patient follow any healthcare provider instructions for all care.  Provide for the patient's basic needs You should help the patient with basic needs in the home and provide support for getting groceries, prescriptions, and other personal needs.  Monitor the patient's symptoms If they are getting sicker, call his or her medical provider and tell them that the patient has, or is being evaluated for, COVID-19 infection. This will help the healthcare provider's office  take steps to keep other people from getting infected. Ask the healthcare provider to call the local or state health department.  Limit the number of people who have contact with the patient  If possible, have only one caregiver for the patient.  Other  household members should stay in another home or place of residence. If this is not possible, they should stay  in another room, or be separated from the patient as much as possible. Use a separate bathroom, if available.  Restrict visitors who do not have an essential need to be in the home.  Keep older adults, very young children, and other sick people away from the patient Keep older adults, very young children, and those who have compromised immune systems or chronic health conditions away from the patient. This includes people with chronic heart, lung, or kidney conditions, diabetes, and cancer.  Ensure good ventilation Make sure that shared spaces in the home have good air flow, such as from an air conditioner or an opened window, weather permitting.  Wash your hands often  Wash your hands often and thoroughly with soap and water for at least 20 seconds. You can use an alcohol based hand sanitizer if soap and water are not available and if your hands are not visibly dirty.  Avoid touching your eyes, nose, and mouth with unwashed hands.  Use disposable paper towels to dry your hands. If not available, use dedicated cloth towels and replace them when they become wet.  Wear a facemask and gloves  Wear a disposable facemask at all times in the room and gloves when you touch or have contact with the patient's blood, body fluids, and/or secretions or excretions, such as sweat, saliva, sputum, nasal mucus, vomit, urine, or feces.  Ensure the mask fits over your nose and mouth tightly, and do not touch it during use.  Throw out disposable facemasks and gloves after using them. Do not reuse.  Wash your hands immediately after removing your facemask and gloves.  If your personal clothing becomes contaminated, carefully remove clothing and launder. Wash your hands after handling contaminated clothing.  Place all used disposable facemasks, gloves, and other waste in a lined container before  disposing them with other household waste.  Remove gloves and wash your hands immediately after handling these items.  Do not share dishes, glasses, or other household items with the patient  Avoid sharing household items. You should not share dishes, drinking glasses, cups, eating utensils, towels, bedding, or other items with a patient who is confirmed to have, or being evaluated for, COVID-19 infection.  After the person uses these items, you should wash them thoroughly with soap and water.  Wash laundry thoroughly  Immediately remove and wash clothes or bedding that have blood, body fluids, and/or secretions or excretions, such as sweat, saliva, sputum, nasal mucus, vomit, urine, or feces, on them.  Wear gloves when handling laundry from the patient.  Read and follow directions on labels of laundry or clothing items and detergent. In general, wash and dry with the warmest temperatures recommended on the label.  Clean all areas the individual has used often  Clean all touchable surfaces, such as counters, tabletops, doorknobs, bathroom fixtures, toilets, phones, keyboards, tablets, and bedside tables, every day. Also, clean any surfaces that may have blood, body fluids, and/or secretions or excretions on them.  Wear gloves when cleaning surfaces the patient has come in contact with.  Use a diluted bleach solution (e.g., dilute bleach with 1 part  bleach and 10 parts water) or a household disinfectant with a label that says EPA-registered for coronaviruses. To make a bleach solution at home, add 1 tablespoon of bleach to 1 quart (4 cups) of water. For a larger supply, add  cup of bleach to 1 gallon (16 cups) of water.  Read labels of cleaning products and follow recommendations provided on product labels. Labels contain instructions for safe and effective use of the cleaning product including precautions you should take when applying the product, such as wearing gloves or eye protection  and making sure you have good ventilation during use of the product.  Remove gloves and wash hands immediately after cleaning.  Monitor yourself for signs and symptoms of illness Caregivers and household members are considered close contacts, should monitor their health, and will be asked to limit movement outside of the home to the extent possible. Follow the monitoring steps for close contacts listed on the symptom monitoring form.   ? If you have additional questions, contact your local health department or call the epidemiologist on call at [phone] (available 24/7). ? This guidance is subject to change. For the most up-to-date guidance from CDC, please refer to their website: YouBlogs.pl    If your COVID test is POSITIVE you may return to work/school  if all of the following are true: 1.  10 days since symptom onset or positive COVID-19 test 2.    3 consecutive days without fever and without antipyretics 3.    You have symptom improvement [especially respiratory]).  If your COVID test is NEGATIVE you may return to work when you are 24 hour fever free and respiratory symptoms are resolved.

## 2019-09-24 NOTE — Telephone Encounter (Signed)
Pt said when she called [facility] she was calling for herself and thought she might get her father in law an appt also. pts father in law is not a pt and Nia has already sent pt to ED by ambulance.Marland Kitchen

## 2019-09-24 NOTE — Progress Notes (Signed)
VIRTUAL VISIT Due to national recommendations of social distancing due to [place] 19, a virtual visit is felt to be most appropriate for this patient at this time.   I connected with the patient on 09/24/19 at 10:40 AM EST by virtual telehealth platform and verified that I am speaking with the correct person using two identifiers.   I discussed the limitations, risks, security and privacy concerns of performing an evaluation and management service by  virtual telehealth platform and the availability of in person appointments. I also discussed with the patient that there may be a patient responsible charge related to this service. The patient expressed understanding and agreed to proceed.  Patient location: Home Provider Location: [facility] Participants: Eliezer Lofts and Bobbye Morton   Chief Complaint  Patient presents with  . Fever    Tested positive for Covid 09/21/2019  . Diarrhea  . Cough    Productive  . Nausea  . Chills  . Fatigue  . Sore Throat  . Shortness of Breath    on exertion    History of Present Illness:   69 year old female pt of Dr. Darnell Level with history  of asthma, DM and HTN  with moderately high risk of COVID complications presents with COVID symptoms.   Positive COVID test on 09/20/2018, symptom as started 09/20/2019 Currently with diarrhea, nausea, no emesis, no blood in stool. Cough, productive white, sore throat. She has chills, fever ( 101 F yesterday)  She has mild SOB with exertion,  No current wheeze She is not currently requiring albuterol. She is on controller: Breo Ellipta   She is keeping up with fluids... no issue with this, nausea is mild  COVID 19 screen No recent travel or known exposure to [place] The patient denies respiratory symptoms of COVID 19 at this time.  The importance of social distancing was discussed today.   Review of Systems  Constitutional: Positive for chills and fever.  HENT: Positive for congestion and  sore throat. Negative for ear pain.   Eyes: Negative for pain and redness.  Respiratory: Positive for cough and sputum production. Negative for shortness of breath and wheezing.   Cardiovascular: Negative for chest pain, palpitations and leg swelling.  Gastrointestinal: Positive for diarrhea and nausea. Negative for abdominal pain, blood in stool, constipation and vomiting.  Genitourinary: Negative for dysuria.  Musculoskeletal: Negative for falls and myalgias.  Skin: Negative for rash.  Neurological: Negative for dizziness.  Psychiatric/Behavioral: Negative for depression. The patient is not nervous/anxious.       Past Medical History:  Diagnosis Date  . Allergic rhinitis   . Anxiety and depression   . Arthritis    hands  . Asthma, persistent   . CAD (coronary artery disease)    per CT 06-02-2015  . Complication of anesthesia    slow to wake  . COPD, mild ([place])    pulmologist--  dr Vilinda Boehringer ([facility] in [place])  . Diet-controlled diabetes mellitus ([place]) 05/01/2011   Completed DSME 10/2015   . Diverticulosis of colon   . Essential hypertension   . GERD (gastroesophageal reflux disease)   . History of cellulitis    Nov 2015-- nasal cellulitis--  resolved  . History of endometriosis   . History of hiatal hernia   . Hyperlipidemia   . OSA on CPAP    severe per study 05-09-2010  . PONV (postoperative nausea and vomiting)   . Shingles 12/29/2006  . Sleep apnea   .  Urothelial carcinoma ([place]) 2016   superficial papillary L lateral bladder wall s/p BCG therapy ([place])    reports that she quit smoking about 33 years ago. Her smoking use included cigarettes. She has a 38.00 pack-year smoking history. She has never used smokeless tobacco. She reports current alcohol use. She reports that she does not use drugs.   Current Outpatient Medications:  .  albuterol (PROVENTIL HFA;VENTOLIN HFA) 108 (90 Base) MCG/ACT inhaler, Inhale 2 puffs into the lungs every 6 (six) hours  as needed. For shortness of breath., Disp: 54 Inhaler, Rfl: 3 .  amLODipine (NORVASC) 10 MG tablet, Take 1 tablet by mouth once daily, Disp: 90 tablet, Rfl: 3 .  amoxicillin (AMOXIL) 875 MG tablet, Take 1 tablet (875 mg total) by mouth 2 (two) times daily., Disp: 14 tablet, Rfl: 0 .  atorvastatin (LIPITOR) 10 MG tablet, Take 1 tablet by mouth once daily, Disp: 90 tablet, Rfl: 3 .  Blood Glucose Monitoring Suppl (ONE TOUCH ULTRA SYSTEM KIT) w/Device KIT, 1 kit by Does not apply route once., Disp: 1 each, Rfl: 0 .  BREO ELLIPTA 100-25 MCG/INH AEPB, Inhale 1 puff by mouth once daily, Disp: 90 each, Rfl: 3 .  FLUoxetine (PROZAC) 10 MG capsule, Take 1 capsule by mouth once daily, Disp: 90 capsule, Rfl: 3 .  fluticasone (FLONASE) 50 MCG/ACT nasal spray, Use 2 spray(s) in each nostril once daily, Disp: 48 g, Rfl: 1 .  glucose blood (ONE TOUCH ULTRA TEST) test strip, 1 each by Other route daily. Use to check sugar once daily and as needed. Dx: E11.9, Disp: 100 each, Rfl: 3 .  guaiFENesin-codeine (ROBITUSSIN AC) 100-10 MG/5ML syrup, Take 5-10 mLs by mouth at bedtime as needed for cough., Disp: 180 mL, Rfl: 0 .  Lidocaine (ZTLIDO) 1.8 % PTCH, Apply 1 patch topically daily., Disp: 30 patch, Rfl: 0 .  lisinopril (ZESTRIL) 5 MG tablet, Take 1 tablet (5 mg total) by mouth daily., Disp: 90 tablet, Rfl: 1 .  loratadine (CLARITIN) 10 MG tablet, TAKE 1 TABLET BY MOUTH ONCE DAILY, Disp: 30 tablet, Rfl: 11 .  Multiple Vitamins-Calcium (VIACTIV MULTI-VITAMIN) CHEW, Chew 1 tablet by mouth daily., Disp: , Rfl:  .  omeprazole (PRILOSEC) 40 MG capsule, Take 1 capsule by mouth once daily, Disp: 90 capsule, Rfl: 2 .  ONETOUCH DELICA LANCETS FINE MISC, Use as directed - check as needed or 3 times weekly, Disp: 100 each, Rfl: 1 .  triamterene-hydrochlorothiazide (MAXZIDE-25) 37.5-25 MG tablet, Take 1 tablet by mouth once daily, Disp: 90 tablet, Rfl: 3   Observations/Objective: Blood pressure (!) 147/74, pulse 75, temperature  98.9 F (37.2 C), temperature source Oral, height '5\' 4"'$  (1.626 m), weight 181 lb (82.1 kg).  Physical Exam  Physical Exam Constitutional:      General: The patient is not in acute distress. Able to speak in complete sentences Pulmonary:     Effort: Pulmonary effort is normal. No respiratory distress.  Neurological:     Mental Status: The patient is alert and oriented to person, place, and time.  Psychiatric:        Mood and Affect: Mood normal.        Behavior: Behavior normal.   Assessment and Plan   COVID-19 virus infection  COVID19  infection  No clear sign of bacterial infection at this time. No current ashtma/COPD flare... given going into weekend I will send in pred taper for her to fill if wheezing or SOB starts.   No red flags/need for ER  visit or in-person exam at respiratory clinic at this time..    Pt moderate risk for COVID complications given asthma, COPD, HTN, DM, obesity and age. If SOB begins  Or symptoms worsening.. have low threshold for in-person exam, if severe shortness of breath ER visit recommended.  Can monitor Oxygen saturation at home with home monitor if able to obtain.  Go to ER if O2 sat < 90% on room air.  Reviewed home care and provided information through [place].  Recommended isolation until test returns. If returns positive 10 days quarantine recommended.  Provided info about prevention of spread of COVID 19.     I discussed the assessment and treatment plan with the patient. The patient was provided an opportunity to ask questions and all were answered. The patient agreed with the plan and demonstrated an understanding of the instructions.   The patient was advised to call back or seek an in-person evaluation if the symptoms worsen or if the condition fails to improve as anticipated.     Eliezer Lofts, MD

## 2019-09-24 NOTE — Telephone Encounter (Signed)
Pt said was tested on 09/21/19 for covid and was resulted by email neg per pt. I looked and pt tested detected for covid. Pt pulled up her email and it did say detected. Pt has fever today of 100, diarrhea for 2 days, nausea, prod cough with white phlegm, chills, weakness, SOB upon exertion,S/T, fatigue. Pt said she does not feel well but in no distress. Pt scheduled virtual appt 09/24/19 at 10:40 with Dr Diona Browner. UC & ED precautions given and pt voiced understanding. Pt was advised to increase fluids,rest, tylenol for fever and body aches, pt understands needs to quarantine 10 days from date reported on 09/22/19 and last 3 days no fever without fever reducing meds and no real respiratory symptoms. Pt voiced understanding. Pt also sent her father in law who lives with her to ED. I advised pt to call ED and let them know that pt has been exposed to + covid and pt said she would.FYI to DR [facility].

## 2019-09-24 NOTE — Assessment & Plan Note (Signed)
COVID19  infection  No clear sign of bacterial infection at this time. No current ashtma/COPD flare... given going into weekend I will send in pred taper for her to fill if wheezing or SOB starts.   No red flags/need for ER visit or in-person exam at respiratory clinic at this time..    Pt moderate risk for COVID complications given asthma, COPD, HTN, DM, obesity and age. If SOB begins  Or symptoms worsening.. have low threshold for in-person exam, if severe shortness of breath ER visit recommended.  Can monitor Oxygen saturation at home with home monitor if able to obtain.  Go to ER if O2 sat < 90% on room air.  Reviewed home care and provided information through [place].  Recommended isolation until test returns. If returns positive 10 days quarantine recommended.  Provided info about prevention of spread of COVID 19.

## 2019-10-07 ENCOUNTER — Ambulatory Visit: Payer: PPO | Attending: Internal Medicine

## 2019-10-07 DIAGNOSIS — Z20822 Contact with and (suspected) exposure to covid-19: Secondary | ICD-10-CM

## 2019-10-08 LAB — NOVEL CORONAVIRUS, NAA: SARS-CoV-2, NAA: NOT DETECTED

## 2019-11-02 ENCOUNTER — Telehealth: Payer: Self-pay

## 2019-11-02 NOTE — Telephone Encounter (Signed)
Pt left v/m that she got her first covid vaccine on 10/31/19; on 11/01/19 pt had fever 101; upset stomach, cough, runny nose, very tired and arm was killing her where she got the injection . Today 11/02/19 pt is feeling better of above symptoms, T 98-99 but today pt has rash at site of covid vaccine. Pt wants to know if she is allergic to covid vaccine, should pt get the 2nd covid vaccine, pt wants to know what to do;pt request cb after review of note.

## 2019-11-02 NOTE — Telephone Encounter (Signed)
Don't think she has allergy to covid shot, but strong immune response to covid shot. Recommend cool compress to arm at site of rash, let us know if not improving or spreading.  Still should be ok to get second covid shot.

## 2019-11-03 NOTE — Telephone Encounter (Signed)
Spoke with pt relaying Dr. G's message. Pt verbalizes understanding.  

## 2019-12-02 ENCOUNTER — Ambulatory Visit: Payer: PPO

## 2019-12-20 ENCOUNTER — Other Ambulatory Visit: Payer: Self-pay | Admitting: Family Medicine

## 2019-12-20 DIAGNOSIS — Z1231 Encounter for screening mammogram for malignant neoplasm of breast: Secondary | ICD-10-CM

## 2019-12-22 DIAGNOSIS — D485 Neoplasm of uncertain behavior of skin: Secondary | ICD-10-CM | POA: Diagnosis not present

## 2019-12-22 DIAGNOSIS — C44519 Basal cell carcinoma of skin of other part of trunk: Secondary | ICD-10-CM | POA: Diagnosis not present

## 2019-12-25 ENCOUNTER — Other Ambulatory Visit: Payer: Self-pay | Admitting: Family Medicine

## 2019-12-25 DIAGNOSIS — C4491 Basal cell carcinoma of skin, unspecified: Secondary | ICD-10-CM

## 2019-12-25 HISTORY — DX: Basal cell carcinoma of skin, unspecified: C44.91

## 2019-12-27 ENCOUNTER — Other Ambulatory Visit: Payer: Self-pay | Admitting: Family Medicine

## 2020-01-07 ENCOUNTER — Encounter: Payer: Self-pay | Admitting: Family Medicine

## 2020-01-07 DIAGNOSIS — E119 Type 2 diabetes mellitus without complications: Secondary | ICD-10-CM | POA: Diagnosis not present

## 2020-01-07 DIAGNOSIS — C44519 Basal cell carcinoma of skin of other part of trunk: Secondary | ICD-10-CM | POA: Diagnosis not present

## 2020-01-07 LAB — HM DIABETES EYE EXAM

## 2020-01-12 HISTORY — PX: SKIN SURGERY: SHX2413

## 2020-01-14 ENCOUNTER — Encounter: Payer: Self-pay | Admitting: Family Medicine

## 2020-01-19 ENCOUNTER — Encounter: Payer: Self-pay | Admitting: Family Medicine

## 2020-01-21 ENCOUNTER — Other Ambulatory Visit: Payer: Self-pay | Admitting: Family Medicine

## 2020-01-21 NOTE — Telephone Encounter (Signed)
E-scribed refill.  Plz schedule [facility], cpe and lab visits.

## 2020-01-27 ENCOUNTER — Telehealth: Payer: Self-pay | Admitting: Family Medicine

## 2020-01-27 NOTE — Telephone Encounter (Signed)
Noted  

## 2020-01-27 NOTE — Telephone Encounter (Signed)
Called patient and got her scheduled CPE, AWV and labs.

## 2020-01-31 ENCOUNTER — Ambulatory Visit
Admission: RE | Admit: 2020-01-31 | Discharge: 2020-01-31 | Disposition: A | Discharge status: Home / Self Care | Payer: PPO | Source: Ambulatory Visit | Attending: Family Medicine | Admitting: Family Medicine

## 2020-01-31 ENCOUNTER — Encounter: Payer: Self-pay | Admitting: Family Medicine

## 2020-01-31 DIAGNOSIS — Z1231 Encounter for screening mammogram for malignant neoplasm of breast: Secondary | ICD-10-CM | POA: Insufficient documentation

## 2020-01-31 LAB — HM MAMMOGRAPHY

## 2020-02-16 DIAGNOSIS — Z8551 Personal history of malignant neoplasm of bladder: Secondary | ICD-10-CM | POA: Diagnosis not present

## 2020-03-07 ENCOUNTER — Other Ambulatory Visit: Payer: Self-pay

## 2020-03-07 ENCOUNTER — Other Ambulatory Visit: Payer: Self-pay | Admitting: Family Medicine

## 2020-03-07 DIAGNOSIS — E119 Type 2 diabetes mellitus without complications: Secondary | ICD-10-CM

## 2020-03-07 DIAGNOSIS — E7800 Pure hypercholesterolemia, unspecified: Secondary | ICD-10-CM

## 2020-03-08 ENCOUNTER — Other Ambulatory Visit (INDEPENDENT_AMBULATORY_CARE_PROVIDER_SITE_OTHER): Payer: PPO

## 2020-03-08 ENCOUNTER — Other Ambulatory Visit: Payer: Self-pay

## 2020-03-08 DIAGNOSIS — E7800 Pure hypercholesterolemia, unspecified: Secondary | ICD-10-CM

## 2020-03-08 DIAGNOSIS — E119 Type 2 diabetes mellitus without complications: Secondary | ICD-10-CM

## 2020-03-08 LAB — COMPREHENSIVE METABOLIC PANEL
ALT: 23 U/L (ref 0–35)
AST: 17 U/L (ref 0–37)
Albumin: 4.1 g/dL (ref 3.5–5.2)
Alkaline Phosphatase: 82 U/L (ref 39–117)
BUN: 14 mg/dL (ref 6–23)
CO2: 30 mEq/L (ref 19–32)
Calcium: 9.3 mg/dL (ref 8.4–10.5)
Chloride: 100 mEq/L (ref 96–112)
Creatinine, Ser: 0.74 mg/dL (ref 0.40–1.20)
GFR: 77.39 mL/min (ref >60.00)
Glucose, Bld: 140 mg/dL — ABNORMAL HIGH (ref 70–99)
Potassium: 4.1 mEq/L (ref 3.5–5.1)
Sodium: 139 mEq/L (ref 135–145)
Total Bilirubin: 0.5 mg/dL (ref 0.2–1.2)
Total Protein: 7.5 g/dL (ref 6.0–8.3)

## 2020-03-08 LAB — LIPID PANEL
Cholesterol: 159 mg/dL (ref 0–200)
HDL: 53 mg/dL (ref >39.00)
LDL Cholesterol: 83 mg/dL (ref 0–99)
NonHDL: 105.76
Total CHOL/HDL Ratio: 3
Triglycerides: 116 mg/dL (ref 0.0–149.0)
VLDL: 23.2 mg/dL (ref 0.0–40.0)

## 2020-03-08 LAB — HEMOGLOBIN A1C: Hgb A1c MFr Bld: 6.9 % — ABNORMAL HIGH (ref 4.6–6.5)

## 2020-03-13 ENCOUNTER — Ambulatory Visit (INDEPENDENT_AMBULATORY_CARE_PROVIDER_SITE_OTHER): Payer: PPO

## 2020-03-13 ENCOUNTER — Other Ambulatory Visit: Payer: Self-pay

## 2020-03-13 DIAGNOSIS — Z0000 Encounter for general adult medical examination without abnormal findings: Secondary | ICD-10-CM | POA: Diagnosis not present

## 2020-03-13 NOTE — Patient Instructions (Signed)
Jennifer Pineda , Thank you for taking time to come for your Medicare Wellness Visit. I appreciate your ongoing commitment to your health goals. Please review the following plan we discussed and let me know if I can assist you in the future.   Screening recommendations/referrals: Colonoscopy: Up to date, completed 10/14/2012, due 09/2022 Mammogram: Up to date, completed 01/31/2020, due 01/2021 Bone Density: due, will have provider order this Recommended yearly ophthalmology/optometry visit for glaucoma screening and checkup Recommended yearly dental visit for hygiene and checkup  Vaccinations: Influenza vaccine: due 03/2020 Pneumococcal vaccine: Completed series Tdap vaccine: Up to date, completed 05/11/2014, due 04/2024 Shingles vaccine: due, check with insurance regarding coverage   Covid-19:Completed series  Advanced directives: copy in chart  Conditions/risks identified: diabetes, hypertension, hypercholesterolemia  Next appointment: Follow up in one year for your annual wellness visit    Preventive Care 4 Years and Older, Female Preventive care refers to lifestyle choices and visits with your health care provider that can promote health and wellness. What does preventive care include?  A yearly physical exam. This is also called an annual well check.  Dental exams once or twice a year.  Routine eye exams. Ask your health care provider how often you should have your eyes checked.  Personal lifestyle choices, including:  Daily care of your teeth and gums.  Regular physical activity.  Eating a healthy diet.  Avoiding tobacco and drug use.  Limiting alcohol use.  Practicing safe sex.  Taking low-dose aspirin every day.  Taking vitamin and mineral supplements as recommended by your health care provider. What happens during an annual well check? The services and screenings done by your health care provider during your annual well check will depend on your age, overall health,  lifestyle risk factors, and family history of disease. Counseling  Your health care provider may ask you questions about your:  Alcohol use.  Tobacco use.  Drug use.  Emotional well-being.  Home and relationship well-being.  Sexual activity.  Eating habits.  History of falls.  Memory and ability to understand (cognition).  Work and work Statistician.  Reproductive health. Screening  You may have the following tests or measurements:  Height, weight, and BMI.  Blood pressure.  Lipid and cholesterol levels. These may be checked every 5 years, or more frequently if you are over 26 years old.  Skin check.  Lung cancer screening. You may have this screening every year starting at age 67 if you have a 30-pack-year history of smoking and currently smoke or have quit within the past 15 years.  Fecal occult blood test (FOBT) of the stool. You may have this test every year starting at age 33.  Flexible sigmoidoscopy or colonoscopy. You may have a sigmoidoscopy every 5 years or a colonoscopy every 10 years starting at age 78.  Hepatitis C blood test.  Hepatitis B blood test.  Sexually transmitted disease (STD) testing.  Diabetes screening. This is done by checking your blood sugar (glucose) after you have not eaten for a while (fasting). You may have this done every 1-3 years.  Bone density scan. This is done to screen for osteoporosis. You may have this done starting at age 76.  Mammogram. This may be done every 1-2 years. Talk to your health care provider about how often you should have regular mammograms. Talk with your health care provider about your test results, treatment options, and if necessary, the need for more tests. Vaccines  Your health care provider may recommend certain  vaccines, such as:  Influenza vaccine. This is recommended every year.  Tetanus, diphtheria, and acellular pertussis (Tdap, Td) vaccine. You may need a Td booster every 10 years.  Zoster  vaccine. You may need this after age 45.  Pneumococcal 13-valent conjugate (PCV13) vaccine. One dose is recommended after age 41.  Pneumococcal polysaccharide (PPSV23) vaccine. One dose is recommended after age 23. Talk to your health care provider about which screenings and vaccines you need and how often you need them. This information is not intended to replace advice given to you by your health care provider. Make sure you discuss any questions you have with your health care provider. Document Released: 09/08/2015 Document Revised: 05/01/2016 Document Reviewed: 06/13/2015 Elsevier Interactive Patient Education  2017 [place] Prevention in the Home Falls can cause injuries. They can happen to people of all ages. There are many things you can do to make your home safe and to help prevent falls. What can I do on the outside of my home?  Regularly fix the edges of walkways and driveways and fix any cracks.  Remove anything that might make you trip as you walk through a door, such as a raised step or threshold.  Trim any bushes or trees on the path to your home.  Use bright outdoor lighting.  Clear any walking paths of anything that might make someone trip, such as rocks or tools.  Regularly check to see if handrails are loose or broken. Make sure that both sides of any steps have handrails.  Any raised decks and porches should have guardrails on the edges.  Have any leaves, snow, or ice cleared regularly.  Use sand or salt on walking paths during winter.  Clean up any spills in your garage right away. This includes oil or grease spills. What can I do in the bathroom?  Use night lights.  Install grab bars by the toilet and in the tub and shower. Do not use towel bars as grab bars.  Use non-skid mats or decals in the tub or shower.  If you need to sit down in the shower, use a plastic, non-slip stool.  Keep the floor dry. Clean up any water that spills on the  floor as soon as it happens.  Remove soap buildup in the tub or shower regularly.  Attach bath mats securely with double-sided non-slip rug tape.  Do not have throw rugs and other things on the floor that can make you trip. What can I do in the bedroom?  Use night lights.  Make sure that you have a light by your bed that is easy to reach.  Do not use any sheets or blankets that are too big for your bed. They should not hang down onto the floor.  Have a firm chair that has side arms. You can use this for support while you get dressed.  Do not have throw rugs and other things on the floor that can make you trip. What can I do in the kitchen?  Clean up any spills right away.  Avoid walking on wet floors.  Keep items that you use a lot in easy-to-reach places.  If you need to reach something above you, use a strong step stool that has a grab bar.  Keep electrical cords out of the way.  Do not use floor polish or wax that makes floors slippery. If you must use wax, use non-skid floor wax.  Do not have throw rugs and other things  on the floor that can make you trip. What can I do with my stairs?  Do not leave any items on the stairs.  Make sure that there are handrails on both sides of the stairs and use them. Fix handrails that are broken or loose. Make sure that handrails are as long as the stairways.  Check any carpeting to make sure that it is firmly attached to the stairs. Fix any carpet that is loose or worn.  Avoid having throw rugs at the top or bottom of the stairs. If you do have throw rugs, attach them to the floor with carpet tape.  Make sure that you have a light switch at the top of the stairs and the bottom of the stairs. If you do not have them, ask someone to add them for you. What else can I do to help prevent falls?  Wear shoes that:  Do not have high heels.  Have rubber bottoms.  Are comfortable and fit you well.  Are closed at the toe. Do not wear  sandals.  If you use a stepladder:  Make sure that it is fully opened. Do not climb a closed stepladder.  Make sure that both sides of the stepladder are locked into place.  Ask someone to hold it for you, if possible.  Clearly mark and make sure that you can see:  Any grab bars or handrails.  First and last steps.  Where the edge of each step is.  Use tools that help you move around (mobility aids) if they are needed. These include:  Canes.  Walkers.  Scooters.  Crutches.  Turn on the lights when you go into a dark area. Replace any light bulbs as soon as they burn out.  Set up your furniture so you have a clear path. Avoid moving your furniture around.  If any of your floors are uneven, fix them.  If there are any pets around you, be aware of where they are.  Review your medicines with your doctor. Some medicines can make you feel dizzy. This can increase your chance of falling. Ask your doctor what other things that you can do to help prevent falls. This information is not intended to replace advice given to you by your health care provider. Make sure you discuss any questions you have with your health care provider. Document Released: 06/08/2009 Document Revised: 01/18/2016 Document Reviewed: 09/16/2014 Elsevier Interactive Patient Education  2017 [employer].

## 2020-03-13 NOTE — Progress Notes (Signed)
PCP notes:  Health Maintenance: Foot exam- due Dexa- due, please order    Abnormal Screenings: none   Patient concerns: Sinus issues [place] Neck pain    Nurse concerns: none   Next PCP appt.: 03/15/2020 @ 4 pm

## 2020-03-13 NOTE — Progress Notes (Signed)
Subjective:   Jennifer Pineda is a 69 y.o. female who presents for Medicare Annual (Subsequent) preventive examination.  Review of Systems: N/A      I connected with the patient today by telephone and verified that I am speaking with the correct person using two identifiers. Location patient: home Location nurse: work Persons participating in the virtual visit: patient, Marine scientist.   I discussed the limitations, risks, security and privacy concerns of performing an evaluation and management service by telephone and the availability of in person appointments. I also discussed with the patient that there may be a patient responsible charge related to this service. The patient expressed understanding and verbally consented to this telephonic visit.    Interactive audio and video telecommunications were attempted between this nurse and patient, however failed, due to patient having technical difficulties OR patient did not have access to video capability.  We continued and completed visit with audio only.     Cardiac Risk Factors include: advanced age (>53mn, >>54women);diabetes mellitus;hypertension;Other (see comment), Risk factor comments: hypercholesterolemia     Objective:    Today's Vitals   03/13/20 1034  PainSc: 5    There is no height or weight on file to calculate BMI.  Advanced Directives 03/13/2020 02/18/2019 08/21/2018 11/11/2017 09/30/2016 10/05/2015 08/15/2015  Does Patient Have a Medical Advance Directive? _0  Yes Yes  Type of AParamedicof ADellekerLiving will HB[place]Living will HAnsoniaLiving will HLamarLiving will [place]itchfieldLiving will Living will;Healthcare Power of [place] Does patient want to make changes to medical advance directive? - - - - - No - Patient declined No - Patient declined  Copy of HNotusin Chart? Yes - validated most recent copy scanned in chart (See row information) Yes - validated most recent copy scanned in chart (See row information) No - copy requested Yes No - copy requested - No - copy requested    Current Medications (verified) Outpatient Encounter Medications as of 03/13/2020  Medication Sig  . albuterol (PROVENTIL HFA;VENTOLIN HFA) 108 (90 Base) MCG/ACT inhaler Inhale 2 puffs into the lungs every 6 (six) hours as needed. For shortness of breath.  .Marland KitchenamLODipine (NORVASC) 10 MG tablet Take 1 tablet by mouth once daily  . atorvastatin (LIPITOR) 10 MG tablet Take 1 tablet by mouth once daily  . Blood Glucose Monitoring Suppl (ONE TOUCH ULTRA SYSTEM KIT) w/Device KIT 1 kit by Does not apply route once.  .Marland KitchenBREO ELLIPTA 100-25 MCG/INH AEPB Inhale 1 puff by mouth once daily  . FLUoxetine (PROZAC) 10 MG capsule Take 1 capsule by mouth once daily  . fluticasone (FLONASE) 50 MCG/ACT nasal spray Use 2 spray(s) in each nostril once daily  . glucose blood (ONE TOUCH ULTRA TEST) test strip 1 each by Other route daily. Use to check sugar once daily and as needed. Dx: E11.9  . guaiFENesin-codeine (ROBITUSSIN AC) 100-10 MG/5ML syrup Take 5-10 mLs by mouth at bedtime as needed for cough.  . Lidocaine (ZTLIDO) 1.8 % PTCH Apply 1 patch topically daily.  .Marland Kitchenlisinopril (ZESTRIL) 5 MG tablet Take 1 tablet by mouth once daily  . loratadine (CLARITIN) 10 MG tablet TAKE 1 TABLET BY MOUTH ONCE DAILY  . Multiple Vitamins-Calcium (VIACTIV MULTI-VITAMIN) CHEW Chew 1 tablet by mouth daily.  .Marland Kitchenomeprazole (PRILOSEC) 40 MG capsule Take 1 capsule by mouth once daily  . ONETOUCH  DELICA LANCETS FINE MISC Use as directed - check as needed or 3 times weekly  . predniSONE (DELTASONE) 20 MG tablet 3 tabs by mouth daily x 3 days, then 2 tabs by mouth daily x 2 days then 1 tab by mouth daily x 2 days  . triamterene-hydrochlorothiazide (MAXZIDE-25) 37.5-25 MG tablet Take 1 tablet by mouth once daily  .  amoxicillin (AMOXIL) 875 MG tablet Take 1 tablet (875 mg total) by mouth 2 (two) times daily. (Patient not taking: Reported on 03/13/2020)   No facility-administered encounter medications on file as of 03/13/2020.    Allergies (verified) Levofloxacin, Ampicillin, Doxycycline hyclate, Metoprolol succinate, Other, Tetracycline hcl, and Adhesive [tape]   History: Past Medical History:  Diagnosis Date  . Allergic rhinitis   . Anxiety and depression   . Arthritis    hands  . Asthma, persistent   . Basal cell carcinoma 12/2019   s/p excision, margins clear (Dr Pearline Cables)  . CAD (coronary artery disease)    per CT 06-02-2015  . Complication of anesthesia    slow to wake  . COPD, mild ([place])    pulmologist--  dr Vilinda Boehringer ([facility] in [place])  . Diet-controlled diabetes mellitus ([place]) 05/01/2011   Completed DSME 10/2015   . Diverticulosis of colon   . Essential hypertension   . GERD (gastroesophageal reflux disease)   . History of cellulitis    Nov 2015-- nasal cellulitis--  resolved  . History of endometriosis   . History of hiatal hernia   . Hyperlipidemia   . OSA on CPAP    severe per study 05-09-2010  . PONV (postoperative nausea and vomiting)   . Shingles 12/29/2006  . Sleep apnea   . Urothelial carcinoma ([place]) 2016   superficial papillary L lateral bladder wall s/p BCG therapy Junious Silk)   Past Surgical History:  Procedure Laterality Date  . ABDOMINAL HYSTERECTOMY  1983   and Appendectomy  . CARDIOVASCULAR STRESS TEST  10-06-1998   no evidence ischemia or scar/  normal LV function and wall motion, ef 65%  . COLONOSCOPY  09/2012   small int hem Carlean Purl) rpt 10 yrs  . CYSTOSCOPY WITH BIOPSY N/A 08/15/2015   Procedure: CYSTOSCOPY WITH BLADDER RESECTION;  Surgeon: Festus Aloe, MD;  [facility];  Service: Urology;  Laterality: N/A;  . ESOPHAGOGASTRODUODENOSCOPY  last one 08/ 2001  . LUMBAR LAMINECTOMY/DECOMPRESSION MICRODISCECTOMY  10/  2013  . SEPTOPLASTY  03/  2002  . SKIN SURGERY  01/12/2020   mole removed from back  . TRANSURETHRAL RESECTION OF BLADDER TUMOR N/A 07/11/2015   Procedure: TRANSURETHRAL RESECTION OF BLADDER TUMOR (TURBT);  Surgeon: Festus Aloe, MD;  Location: [facility];  Service: Urology;  Laterality: N/A;  . TRANSURETHRAL RESECTION OF BLADDER TUMOR N/A 08/15/2015   Procedure: TRANSURETHRAL RESECTION OF BLADDER TUMOR (TURBT);  Surgeon: Festus Aloe, MD;  Location: [facility];  Service: Urology;  Laterality: N/A;  . TRIGGER FINGER RELEASE Right 2014   Family History  Problem Relation Age of Onset  . Cancer Mother        kidney and lymphoma CA, neprectomy tumor of lung, non smoker  . Kidney disease Mother        T-cell cancer in kidney  . Aneurysm Father        ? in throat  . Diabetes Father   . Heart disease Father        MI  . Diabetes Sister   . Hyperlipidemia Sister   .  Heart disease Brother 71       MI  . Alcohol abuse Brother        smokes again  . Hypertension Brother   . Cancer Brother 50       T-cell renal cancer/ removed  . Hypertension Brother   . Colon cancer Neg Hx   . Esophageal cancer Neg Hx   . Rectal cancer Neg Hx   . Stomach cancer Neg Hx   . Breast cancer Neg Hx    Social History   Socioeconomic History  . Marital status: Divorced    Spouse name: Not on file  . Number of children: 2  . Years of education: Not on file  . Highest education level: Not on file  Occupational History  . Occupation: Event organiser: [employer]    Comment: [employer]  Tobacco Use  . Smoking status: Former Smoker    Packs/day: 2.00    Years: 19.00    Pack years: 38.00    Types: Cigarettes    Quit date: 08/26/1986    Years since quitting: 33.5  . Smokeless tobacco: Never Used  Vaping Use  . Vaping Use: Never used  Substance and Sexual Activity  . Alcohol use: Yes    Comment: 3 margaritas a week   . Drug use: No  .  Sexual activity: Never  Other Topics Concern  . Not on file  Social History Narrative   Divorced   Daughter [name], lives with her   Children: 2 children // one daughter lives with mother/ son lives with father   Activity: 20 min walk daily   Diet: good water, fruits/vegetables daily.    Social Determinants of Health   Financial Resource Strain: Low Risk   . Difficulty of Paying Living Expenses: Not hard at all  Food Insecurity: No Food Insecurity  . Worried About Charity fundraiser in the Last Year: Never true  . Ran Out of Food in the Last Year: Never true  Transportation Needs: No Transportation Needs  . Lack of Transportation (Medical): No  . Lack of Transportation (Non-Medical): No  Physical Activity: Inactive  . Days of Exercise per Week: 0 days  . Minutes of Exercise per Session: 0 min  Stress: Stress Concern Present  . Feeling of Stress : To some extent  Social Connections:   . Frequency of Communication with Friends and Family:   . Frequency of Social Gatherings with Friends and Family:   . Attends Religious Services:   . Active Member of Clubs or Organizations:   . Attends Archivist Meetings:   Marland Kitchen Marital Status:     Tobacco Counseling Counseling given: Not Answered   Clinical Intake:  Pre-visit preparation completed: Yes  Pain : 0-10 Pain Score: 5  Pain Type: Chronic pain Pain Location: Knee Pain Orientation: Right Pain Descriptors / Indicators: Aching Pain Onset: More than a month ago Pain Frequency: Intermittent     Nutritional Risks: None Diabetes: Yes CBG done?: No Did pt. bring in CBG monitor from home?: No  How often do you need to have someone help you when you read instructions, pamphlets, or other written materials from your doctor or pharmacy?: 1 - Never What is the last grade level you completed in school?: 2 college degrees  Diabetic: Yes Nutrition Risk Assessment:  Has the patient had any N/V/D within the  last 2 months?  No  Does the patient have any non-healing wounds?  No  Has the patient had any unintentional weight loss or weight gain?  No   Diabetes:  Is the patient diabetic?  Yes  If diabetic, was a CBG obtained today?  No  Did the patient bring in their glucometer from home?  No  How often do you monitor your CBG's? N/A.   Financial Strains and Diabetes Management:  Are you having any financial strains with the device, your supplies or your medication? No .  Does the patient want to be seen by Chronic Care Management for management of their diabetes?  No  Would the patient like to be referred to a Nutritionist or for Diabetic Management?  No   Diabetic Exams:  Diabetic Eye Exam: Completed 01/07/2020 Diabetic Foot Exam: Overdue, Pt has been advised about the importance in completing this exam. Pt is scheduled for diabetic foot exam on 03/15/2020.   Interpreter Needed?: No  Information entered by :: CJohnson, LPN   Activities of Daily Living In your present state of health, do you have any difficulty performing the following activities: 03/13/2020  Hearing? N  Vision? N  Difficulty concentrating or making decisions? N  Walking or climbing stairs? N  Dressing or bathing? N  Doing errands, shopping? N  Preparing Food and eating ? N  Using the Toilet? N  In the past six months, have you accidently leaked urine? Y  Comment some leakage at times  Do you have problems with loss of bowel control? N  Managing your Medications? N  Managing your Finances? N  Housekeeping or managing your Housekeeping? N  Some recent data might be hidden    Patient Care Team: Ria Bush, MD as PCP - General (Family Medicine) Arelia Sneddon, [place] as Consulting Physician (Optometry) Festus Aloe, MD as Consulting Physician (Urology) Beverly Gust, MD as Consulting Physician (Otolaryngology) Vilinda Boehringer, MD (Inactive) as Consulting Physician (Internal Medicine) Wellington Hampshire, PA-C as Physician Assistant (Physician Assistant)  Indicate any recent Medical Services you may have received from other than [facility] providers in the past year (date may be approximate).     Assessment:   This is a routine wellness examination for Cyst[place].  Hearing/Vision screen  Hearing Screening   _0  _1  _2  _3  _4  _5  _6  _7  _8   Right ear:           Left ear:           Vision Screening Comments: Patient gets annual eye exams   Dietary issues and exercise activities discussed: Current Exercise Habits: The patient does not participate in regular exercise at present, Exercise limited by: None identified  Goals    . Increase physical activity     Starting 02/18/2019, I will continue to walk for at least 20 minutes daily.     . Patient Stated     03/13/2020, I will maintain and continue medications as prescribed.       Depression Screen PHQ 2/9 Scores 03/13/2020 02/18/2019 11/11/2017 09/30/2016 10/05/2015 09/08/2015 05/11/2014  PHQ - 2 Score 0 0 0 0 0 0 0  PHQ- 9 Score 0 0 0 - - - -    Fall Risk Fall Risk  03/13/2020 02/18/2019 11/11/2017 09/30/2016 10/30/2015  Falls in the past year? 0 0 No No No  Comment - - - - -  Number falls in past yr: 0 - - - -  Injury with Fall? 0 - - - -  Risk for fall due to : Medication side effect - - - -  Follow up Falls evaluation completed;Falls prevention discussed - - - -    Any stairs in or around the home? Yes  If so, are there any without handrails? No  Home free of loose throw rugs in walkways, pet beds, electrical cords, etc? Yes  Adequate lighting in your home to reduce risk of falls? Yes   ASSISTIVE DEVICES UTILIZED TO PREVENT FALLS:  Life alert? No  Use of a cane, walker or w/c? No  Grab bars in the bathroom? No  Shower chair or bench in shower? No  Elevated toilet seat or a handicapped toilet? No   TIMED UP AND GO:  Was the test performed? N/A, telephonic visit.  Cognitive Function: MMSE - Mini Mental State  Exam 03/13/2020 02/18/2019 11/11/2017 09/30/2016  Orientation to time _0 Orientation to Place _1 Registration _2 Attention/ Calculation 5 0 0 0  Recall _3 Language- name 2 objects - 0 0 0  Language- repeat _4 Language- follow 3 step command - 0 3 3  Language- read & follow direction - 0 0 0  Write a sentence - 0 0 0  Copy design - 0 0 0  Total score - _5 Mini Cog  Mini-Cog screen was completed. Maximum score is 22. A value of 0 denotes this part of the MMSE was not completed or the patient failed this part of the Mini-Cog screening.       Immunizations Immunization History  Administered Date(s) Administered  . Influenza, High Dose Seasonal PF 07/07/2017  . Influenza,inj,Quad PF,6+ Mos 09/08/2015, 10/07/2016, 05/20/2018  . Moderna SARS-COVID-2 Vaccination 10/31/2019, 12/01/2019  . Pneumococcal Conjugate-13 05/11/2014  . Pneumococcal Polysaccharide-23 09/08/2015  . Td 02/11/2004  . Tdap 05/11/2014  . Zoster 06/08/2014    TDAP status: Up to date Flu Vaccine status: due Fall 2021 Pneumococcal vaccine status: Up to date Covid-19 vaccine status: Completed vaccines  Qualifies for Shingles Vaccine? Yes   Zostavax completed Yes   Shingrix Completed?: No.    Education has been provided regarding the importance of this vaccine. Patient has been advised to call insurance company to determine out of pocket expense if they have not yet received this vaccine. Advised may also receive vaccine at local pharmacy or Health Dept. Verbalized acceptance and understanding.  Screening Tests Health Maintenance  Topic Date Due  . DTAP VACCINES (1) 06/18/1951  . FOOT EXAM  05/21/2019  . INFLUENZA VACCINE  03/26/2020  . HEMOGLOBIN A1C  09/08/2020  . OPHTHALMOLOGY EXAM  01/06/2021  . MAMMOGRAM  01/30/2021  . COLONOSCOPY  10/14/2022  . DTaP/Tdap/Td (3 - Td or Tdap) 05/11/2024  . TETANUS/TDAP  05/11/2024  . DEXA SCAN  Completed  . COVID-19 Vaccine  Completed    . Hepatitis C Screening  Completed  . PNA vac Low Risk Adult  Completed    Health Maintenance  Health Maintenance Due  Topic Date Due  . DTAP VACCINES (1) 06/18/1951  . FOOT EXAM  05/21/2019    Colorectal cancer screening: Completed 10/14/2012. Repeat every 10 years Mammogram status: Completed 01/31/2020. Repeat every year Bone Density status: due, will have provider order  Lung Cancer Screening: (Low Dose CT Chest recommended if Age 70-80 years, 30 pack-year currently smoking OR have quit w/in 15years.) does not qualify.     Additional Screening:  Hepatitis C Screening: does qualify; Completed 09/30/2016  Vision Screening: Recommended annual ophthalmology exams for  early detection of glaucoma and other disorders of the eye. Is the patient up to date with their annual eye exam?  Yes  Who is the provider or what is the name of the office in which the patient attends annual eye exams? [facility] If pt is not established with a provider, would they like to be referred to a provider to establish care? No .   Dental Screening: Recommended annual dental exams for proper oral hygiene  Community Resource Referral / Chronic Care Management: CRR required this visit?  No   CCM required this visit?  No      Plan:     I have personally reviewed and noted the following in the patient's chart:   . Medical and social history . Use of alcohol, tobacco or illicit drugs  . Current medications and supplements . Functional ability and status . Nutritional status . Physical activity . Advanced directives . List of other physicians . Hospitalizations, surgeries, and ER visits in previous 12 months . Vitals . Screenings to include cognitive, depression, and falls . Referrals and appointments  In addition, I have reviewed and discussed with patient certain preventive protocols, quality metrics, and best practice recommendations. A written personalized care plan for preventive  services as well as general preventive health recommendations were provided to patient.   Due to this being a telephonic visit, the after visit summary with patients personalized plan was offered to patient via mail or my-chart. Patient preferred to pick up at office at next visit.   Andrez Grime, LPN   9/31/1216

## 2020-03-15 ENCOUNTER — Other Ambulatory Visit: Payer: Self-pay

## 2020-03-15 ENCOUNTER — Encounter: Payer: Self-pay | Admitting: Family Medicine

## 2020-03-15 ENCOUNTER — Ambulatory Visit (INDEPENDENT_AMBULATORY_CARE_PROVIDER_SITE_OTHER): Payer: PPO | Admitting: Family Medicine

## 2020-03-15 VITALS — BP 140/62 | HR 72 | Temp 97.9°F | Ht 63.5 in | Wt 188.5 lb

## 2020-03-15 DIAGNOSIS — J3089 Other allergic rhinitis: Secondary | ICD-10-CM | POA: Diagnosis not present

## 2020-03-15 DIAGNOSIS — E7800 Pure hypercholesterolemia, unspecified: Secondary | ICD-10-CM | POA: Diagnosis not present

## 2020-03-15 DIAGNOSIS — Z8551 Personal history of malignant neoplasm of bladder: Secondary | ICD-10-CM | POA: Diagnosis not present

## 2020-03-15 DIAGNOSIS — F341 Dysthymic disorder: Secondary | ICD-10-CM | POA: Diagnosis not present

## 2020-03-15 DIAGNOSIS — J4530 Mild persistent asthma, uncomplicated: Secondary | ICD-10-CM

## 2020-03-15 DIAGNOSIS — Z0000 Encounter for general adult medical examination without abnormal findings: Secondary | ICD-10-CM

## 2020-03-15 DIAGNOSIS — K219 Gastro-esophageal reflux disease without esophagitis: Secondary | ICD-10-CM

## 2020-03-15 DIAGNOSIS — M542 Cervicalgia: Secondary | ICD-10-CM | POA: Insufficient documentation

## 2020-03-15 DIAGNOSIS — I10 Essential (primary) hypertension: Secondary | ICD-10-CM | POA: Diagnosis not present

## 2020-03-15 DIAGNOSIS — E119 Type 2 diabetes mellitus without complications: Secondary | ICD-10-CM

## 2020-03-15 DIAGNOSIS — G4733 Obstructive sleep apnea (adult) (pediatric): Secondary | ICD-10-CM

## 2020-03-15 MED ORDER — OMEPRAZOLE 40 MG PO CPDR: 40.0000 mg | DELAYED_RELEASE_CAPSULE | Freq: Every day | ORAL | 3 refills | Status: DC

## 2020-03-15 MED ORDER — LISINOPRIL 5 MG PO TABS: 5.0000 mg | ORAL_TABLET | Freq: Every day | ORAL | 3 refills | Status: DC

## 2020-03-15 MED ORDER — FLUTICASONE PROPIONATE 50 MCG/ACT NA SUSP: NASAL | 3 refills | Status: DC

## 2020-03-15 MED ORDER — ATORVASTATIN CALCIUM 10 MG PO TABS: 10.0000 mg | ORAL_TABLET | Freq: Every day | ORAL | 3 refills | Status: DC

## 2020-03-15 MED ORDER — BREO ELLIPTA 100-25 MCG/INH IN AEPB: 1.0000 | INHALATION_SPRAY | Freq: Every day | RESPIRATORY_TRACT | 3 refills | Status: DC

## 2020-03-15 MED ORDER — AZELASTINE HCL 0.1 % NA SOLN: 1.0000 | Freq: Two times a day (BID) | NASAL | 12 refills | Status: DC

## 2020-03-15 MED ORDER — AMLODIPINE BESYLATE 10 MG PO TABS: 10.0000 mg | ORAL_TABLET | Freq: Every day | ORAL | 3 refills | Status: DC

## 2020-03-15 MED ORDER — TRIAMTERENE-HCTZ 37.5-25 MG PO TABS: 1.0000 | ORAL_TABLET | Freq: Every day | ORAL | 3 refills | Status: DC

## 2020-03-15 MED ORDER — FLUOXETINE HCL 10 MG PO CAPS: 10.0000 mg | ORAL_CAPSULE | Freq: Every day | ORAL | 3 refills | Status: DC

## 2020-03-15 MED ORDER — ALBUTEROL SULFATE HFA 108 (90 BASE) MCG/ACT IN AERS: 2.0000 | INHALATION_SPRAY | Freq: Four times a day (QID) | RESPIRATORY_TRACT | 6 refills | Status: AC | PRN

## 2020-03-15 NOTE — Assessment & Plan Note (Signed)
Continues CPAP treatment for her OSA

## 2020-03-15 NOTE — Assessment & Plan Note (Addendum)
Chronic, stable, diet controlled with dietary indiscretions this past year. Encouraged renewed efforts to follow low sugar low carb diabetic diet. Pt motivated.  RTC 6 mo DM f/u visit.

## 2020-03-15 NOTE — Assessment & Plan Note (Signed)
Chronic, stable on 4 drug regimen - continue.

## 2020-03-15 NOTE — Assessment & Plan Note (Signed)
Deteriorated - add astelin to her antihistamine and flonase regimen.

## 2020-03-15 NOTE — Assessment & Plan Note (Addendum)
Regularly sees urology. Appreciate their care.

## 2020-03-15 NOTE — Assessment & Plan Note (Signed)
Chronic, stable on daily omeprazole - will continue.

## 2020-03-15 NOTE — Assessment & Plan Note (Addendum)
Benign exam. rec heating pad, gentle stretching exercises (handout provided), let us know if desires trial of muscle relaxant.

## 2020-03-15 NOTE — Assessment & Plan Note (Signed)
Stable period on prozac 10mg  daily - continue.

## 2020-03-15 NOTE — Progress Notes (Signed)
This visit was conducted in person.  BP 140/62 (BP Location: Left Arm, Patient Position: Sitting, Cuff Size: Normal)   Pulse 72   Temp 97.9 F (36.6 C) (Temporal)   Ht 5' 3.5" (1.613 m)   Wt 188 lb 8 oz (85.5 kg)   SpO2 96%   BMI 32.87 kg/m   BP Readings from Last 3 Encounters:  03/15/20 140/62  09/24/19 (!) 147/74  07/02/19 (!) 142/77    CC: CPE Subjective:    Patient ID: Jennifer Pineda, female    DOB: 12-Mar-1951, 69 y.o.   MRN: [id number]  HPI: Jennifer Pineda is a 69 y.o. female presenting on 03/15/2020 for Annual Exam (Prt 2.  Per pt, she is no longer allergic to ampicillin. )   Saw health advisor this week for medicare wellness visit. Note reviewed.    No exam data present    Clinical Support from 03/13/2020 in [place] at [facility] Total Score 0      Fall Risk  03/13/2020 02/18/2019 11/11/2017 09/30/2016 10/30/2015  Falls in the past year? 0 0 No No No  Comment - - - - -  Number falls in past yr: 0 - - - -  Injury with Fall? 0 - - - -  Risk for fall due to : Medication side effect - - - -  Follow up Falls evaluation completed;Falls prevention discussed - - - -      H/o bladder cancer s/p TURBT 07/2015. Sees urology yearly Junious Silk).  83 yo step father lived with her last year until he passed 09/2019.  Laid off of work in March 2020.  OSA on CPAP.  Notes ongoing sinus trouble - runny nose, sneezing, cough, PNdrainage - no improvement despite flonase, claritin, neti pot, guaifenesin.   Preventative: COLONOSCOPY Date: 09/2012 small int hem Carlean Purl) rpt 28 yrs Well woman - last pap was 2010, all normal. Hysterectomy age 33yo.Ovaries remain. Mammogram - 01/2020 Birads1 DEXA 06/2014 - WNL Lung cancer screening - not eligible Flu -yearly prevnar 2015, pneumovax1/2017 Td 2005, Tdap 2015 zostavax- 2015  COVID vaccine - completed Moderna 11/2019 shingrix - discussed - to check with pharmacy.  Advanced directives -in chart 08/2015.Would want  son [name]) to be HCPOA. No prolonged life support if terminal condition. Full code, ok with temporary ventilation.  Seat belt use discussed Sunscreen use discussed. No changing moles on skin.sees derm s/p BCC removed 2021  Ex smoker - quit 1988 Alcohol - had increased margaritas - planning to change to 1 glass of wine instead Eye exam yearly - macular degeneration Dentist q6 mo  Bowel - no constipation Bladder - some stress incontinence, nocturia x3-4  Divorced Daughter Jennifer Pineda, lives with her Children: 2 children // one daughter lives with mother/ son lives with father Activity: 20 min walk daily Diet: good water, fruits/vegetables daily.     Relevant past medical, surgical, family and social history reviewed and updated as indicated. Interim medical history since our last visit reviewed. Allergies and medications reviewed and updated. Outpatient Medications Prior to Visit  Medication Sig Dispense Refill  . Blood Glucose Monitoring Suppl (ONE TOUCH ULTRA SYSTEM KIT) w/Device KIT 1 kit by Does not apply route once. 1 each 0  . fluticasone (CUTIVATE) 0.05 % cream Apply topically. For lips    . GENTAMICIN SULFATE EX Apply topically. For nose    . glucose blood (ONE TOUCH ULTRA TEST) test strip 1 each by Other route daily. Use to check sugar  once daily and as needed. Dx: E11.9 100 each 3  . loratadine (CLARITIN) 10 MG tablet TAKE 1 TABLET BY MOUTH ONCE DAILY 90 tablet 0  . Multiple Vitamins-Calcium (VIACTIV MULTI-VITAMIN) CHEW Chew 1 tablet by mouth daily.    . Multiple Vitamins-Minerals (PRESERVISION AREDS PO) Take by mouth at bedtime.    Glory Rosebush DELICA LANCETS FINE MISC Use as directed - check as needed or 3 times weekly 100 each 1  . amLODipine (NORVASC) 10 MG tablet Take 1 tablet by mouth once daily 90 tablet 3  . atorvastatin (LIPITOR) 10 MG tablet Take 1 tablet by mouth once daily 90 tablet 3  . BREO ELLIPTA 100-25 MCG/INH AEPB Inhale 1 puff by mouth once  daily 90 each 3  . FLUoxetine (PROZAC) 10 MG capsule Take 1 capsule by mouth once daily 90 capsule 3  . fluticasone (FLONASE) 50 MCG/ACT nasal spray Use 2 spray(s) in each nostril once daily 48 g 1  . guaiFENesin-codeine (ROBITUSSIN AC) 100-10 MG/5ML syrup Take 5-10 mLs by mouth at bedtime as needed for cough. 180 mL 0  . lisinopril (ZESTRIL) 5 MG tablet Take 1 tablet by mouth once daily 90 tablet 1  . omeprazole (PRILOSEC) 40 MG capsule Take 1 capsule by mouth once daily 90 capsule 0  . triamterene-hydrochlorothiazide (MAXZIDE-25) 37.5-25 MG tablet Take 1 tablet by mouth once daily 90 tablet 3  . albuterol (PROVENTIL HFA;VENTOLIN HFA) 108 (90 Base) MCG/ACT inhaler Inhale 2 puffs into the lungs every 6 (six) hours as needed. For shortness of breath. 54 Inhaler 3  . amoxicillin (AMOXIL) 875 MG tablet Take 1 tablet (875 mg total) by mouth 2 (two) times daily. (Patient not taking: Reported on 03/13/2020) 14 tablet 0  . Lidocaine (ZTLIDO) 1.8 % PTCH Apply 1 patch topically daily. 30 patch 0  . predniSONE (DELTASONE) 20 MG tablet 3 tabs by mouth daily x 3 days, then 2 tabs by mouth daily x 2 days then 1 tab by mouth daily x 2 days 15 tablet 0   No facility-administered medications prior to visit.     Per HPI unless specifically indicated in ROS section below Review of Systems  Constitutional: Negative for activity change, appetite change, chills, fatigue, fever and unexpected weight change.  HENT: Negative for hearing loss.   Eyes: Negative for visual disturbance.  Respiratory: Negative for cough, chest tightness, shortness of breath and wheezing.   Cardiovascular: Negative for chest pain, palpitations and leg swelling.  Gastrointestinal: Negative for abdominal distention, abdominal pain, blood in stool, constipation, diarrhea, nausea and vomiting.  Genitourinary: Negative for difficulty urinating and hematuria.  Musculoskeletal: Positive for arthralgias (R knee pain managed with voltaren).  Negative for myalgias and neck pain.  Skin: Negative for rash.  Neurological: Negative for dizziness, seizures, syncope and headaches.  Hematological: Negative for adenopathy. Does not bruise/bleed easily.  Psychiatric/Behavioral: Negative for dysphoric mood. The patient is not nervous/anxious.   no back pain.  Objective:  BP 140/62 (BP Location: Left Arm, Patient Position: Sitting, Cuff Size: Normal)   Pulse 72   Temp 97.9 F (36.6 C) (Temporal)   Ht 5' 3.5" (1.613 m)   Wt 188 lb 8 oz (85.5 kg)   SpO2 96%   BMI 32.87 kg/m   Wt Readings from Last 3 Encounters:  03/15/20 188 lb 8 oz (85.5 kg)  09/24/19 181 lb (82.1 kg)  07/02/19 186 lb (84.4 kg)      Physical Exam Vitals and nursing note reviewed.  Constitutional:  General: She is not in acute distress.    Appearance: Normal appearance. She is well-developed. She is not ill-appearing.  HENT:     Head: Normocephalic and atraumatic.     Right Ear: Hearing, tympanic membrane, ear canal and external ear normal.     Left Ear: Hearing, tympanic membrane, ear canal and external ear normal.     Nose: Nose normal. No congestion or rhinorrhea.     Mouth/Throat:     Mouth: Mucous membranes are moist.     Pharynx: Oropharynx is clear. No oropharyngeal exudate or posterior oropharyngeal erythema.  Eyes:     General: No scleral icterus.    Extraocular Movements: Extraocular movements intact.     Conjunctiva/sclera: Conjunctivae normal.     Pupils: Pupils are equal, round, and reactive to light.  Neck:     Thyroid: No thyroid mass, thyromegaly or thyroid tenderness.     Comments:  Limited ROM to lateral flexion and lateral rotation to right 2/2 discomfort/stiffness at neck  No pain at trapezius mm  No midline cervical spine tenderness or paracervical mm tenderness  Cardiovascular:     Rate and Rhythm: Normal rate and regular rhythm.     Pulses: Normal pulses.          Radial pulses are 2+ on the right side and 2+ on the left  side.     Heart sounds: Normal heart sounds. No murmur heard.   Pulmonary:     Effort: Pulmonary effort is normal. No respiratory distress.     Breath sounds: Normal breath sounds. No wheezing, rhonchi or rales.  Abdominal:     General: Abdomen is flat. Bowel sounds are normal. There is no distension.     Palpations: Abdomen is soft. There is no mass.     Tenderness: There is no abdominal tenderness. There is no guarding or rebound.     Hernia: No hernia is present.  Musculoskeletal:        General: Normal range of motion.     Cervical back: Normal range of motion and neck supple.     Right lower leg: No edema.     Left lower leg: No edema.  Lymphadenopathy:     Head:     Right side of head: No submental, submandibular, tonsillar, preauricular or posterior auricular adenopathy.     Left side of head: No submental, submandibular, tonsillar or posterior auricular adenopathy.     Cervical: No cervical adenopathy.     Right cervical: No superficial cervical adenopathy.    Left cervical: No superficial cervical adenopathy.     Upper Body:     Right upper body: No supraclavicular adenopathy.     Left upper body: No supraclavicular adenopathy.  Skin:    General: Skin is warm and dry.     Findings: No rash.  Neurological:     General: No focal deficit present.     Mental Status: She is alert and oriented to person, place, and time.     Comments: CN grossly intact, station and gait intact  Psychiatric:        Mood and Affect: Mood normal.        Behavior: Behavior normal.        Thought Content: Thought content normal.        Judgment: Judgment normal.       Results for orders placed or performed in visit on 03/08/20  Hemoglobin A1c  Result Value Ref Range   Hgb A1c MFr Bld  6.9 (H) 4.6 - 6.5 %  Comprehensive metabolic panel  Result Value Ref Range   Sodium 139 135 - 145 mEq/L   Potassium 4.1 3.5 - 5.1 mEq/L   Chloride 100 96 - 112 mEq/L   CO2 30 19 - 32 mEq/L   Glucose, Bld  140 (H) 70 - 99 mg/dL   BUN 14 6 - 23 mg/dL   Creatinine, Ser 0.74 0.40 - 1.20 mg/dL   Total Bilirubin 0.5 0.2 - 1.2 mg/dL   Alkaline Phosphatase 82 39 - 117 U/L   AST 17 0 - 37 U/L   ALT 23 0 - 35 U/L   Total Protein 7.5 6.0 - 8.3 g/dL   Albumin 4.1 3.5 - 5.2 g/dL   GFR 77.39 >60.00 mL/min   Calcium 9.3 8.4 - 10.5 mg/dL  Lipid panel  Result Value Ref Range   Cholesterol 159 0 - 200 mg/dL   Triglycerides 116.0 0 - 149 mg/dL   HDL 53.00 >39.00 mg/dL   VLDL 23.2 0.0 - 40.0 mg/dL   LDL Cholesterol 83 0 - 99 mg/dL   Total CHOL/HDL Ratio 3    NonHDL 105.76    Assessment & Plan:  This visit occurred during the SARS-CoV-2 public health emergency.  Safety protocols were in place, including screening questions prior to the visit, additional usage of staff PPE, and extensive cleaning of exam room while observing appropriate contact time as indicated for disinfecting solutions.   Problem List Items Addressed This Visit    Persistent asthma without complication    Chronic, stable on breo daily.  Albuterol refilled.       Relevant Medications   fluticasone furoate-vilanterol (BREO ELLIPTA) 100-25 MCG/INH AEPB   albuterol (VENTOLIN HFA) 108 (90 Base) MCG/ACT inhaler   Obstructive sleep apnea    Continues CPAP treatment for her OSA      Neck pain on left side    Benign exam. rec heating pad, gentle stretching exercises (handout provided), let us know if desires trial of muscle relaxant.       HYPERCHOLESTEROLEMIA    Chronic, stable. Continue lipitor. The 10-year ASCVD risk score Mikey Bussing DC Brooke Bonito., et al., 2013) is: 25.4%   Values used to calculate the score:     Age: 12 years     Sex: Female     Is Non-Hispanic African American: No     Diabetic: Yes     Tobacco smoker: No     Systolic Blood Pressure: 824 mmHg     Is BP treated: Yes     HDL Cholesterol: 53 mg/dL     Total Cholesterol: 159 mg/dL       Relevant Medications   amLODipine (NORVASC) 10 MG tablet   atorvastatin  (LIPITOR) 10 MG tablet   lisinopril (ZESTRIL) 5 MG tablet   triamterene-hydrochlorothiazide (MAXZIDE-25) 37.5-25 MG tablet   History of bladder cancer    Regularly sees urology. Appreciate their care.       Healthcare maintenance - Primary    Preventative protocols reviewed and updated unless pt declined. Discussed healthy diet and lifestyle.       GERD    Chronic, stable on daily omeprazole - will continue.       Relevant Medications   omeprazole (PRILOSEC) 40 MG capsule   Essential hypertension    Chronic, stable on 4 drug regimen - continue.       Relevant Medications   amLODipine (NORVASC) 10 MG tablet   atorvastatin (LIPITOR) 10 MG tablet  lisinopril (ZESTRIL) 5 MG tablet   triamterene-hydrochlorothiazide (MAXZIDE-25) 37.5-25 MG tablet   Controlled type 2 diabetes mellitus without complication, without long-term current use of insulin (HCC)    Chronic, stable, diet controlled with dietary indiscretions this past year. Encouraged renewed efforts to follow low sugar low carb diabetic diet. Pt motivated.  RTC 6 mo DM f/u visit.       Relevant Medications   atorvastatin (LIPITOR) 10 MG tablet   lisinopril (ZESTRIL) 5 MG tablet   ANXIETY DEPRESSION    Stable period on prozac 64m daily - continue.       Relevant Medications   FLUoxetine (PROZAC) 10 MG capsule   Allergic rhinitis    Deteriorated - add astelin to her antihistamine and flonase regimen.           Meds ordered this encounter  Medications  . fluticasone furoate-vilanterol (BREO ELLIPTA) 100-25 MCG/INH AEPB    Sig: Inhale 1 puff into the lungs daily.    Dispense:  90 each    Refill:  3  . amLODipine (NORVASC) 10 MG tablet    Sig: Take 1 tablet (10 mg total) by mouth daily.    Dispense:  90 tablet    Refill:  3  . atorvastatin (LIPITOR) 10 MG tablet    Sig: Take 1 tablet (10 mg total) by mouth daily.    Dispense:  90 tablet    Refill:  3  . FLUoxetine (PROZAC) 10 MG capsule    Sig: Take 1  capsule (10 mg total) by mouth daily.    Dispense:  90 capsule    Refill:  3  . fluticasone (FLONASE) 50 MCG/ACT nasal spray    Sig: Use 2 spray(s) in each nostril once daily    Dispense:  48 g    Refill:  3  . lisinopril (ZESTRIL) 5 MG tablet    Sig: Take 1 tablet (5 mg total) by mouth daily.    Dispense:  90 tablet    Refill:  3  . omeprazole (PRILOSEC) 40 MG capsule    Sig: Take 1 capsule (40 mg total) by mouth daily.    Dispense:  90 capsule    Refill:  3  . triamterene-hydrochlorothiazide (MAXZIDE-25) 37.5-25 MG tablet    Sig: Take 1 tablet by mouth daily.    Dispense:  90 tablet    Refill:  3  . azelastine (ASTELIN) 0.1 % nasal spray    Sig: Place 1 spray into both nostrils 2 (two) times daily. Use in each nostril as directed    Dispense:  30 mL    Refill:  12  . albuterol (VENTOLIN HFA) 108 (90 Base) MCG/ACT inhaler    Sig: Inhale 2 puffs into the lungs every 6 (six) hours as needed. For shortness of breath.    Dispense:  18 g    Refill:  6   No orders of the defined types were placed in this encounter.   Patient instructions: If interested, check with pharmacy about new 2 shot shingles series (shingrix).  Work on low sugar diet to keep diabetes under control.  Add astelin nasal spray for allergy control Return as needed or in 6 months for diabetes follow up.   Follow up plan: Return in about 6 months (around 09/15/2020) for follow up visit.  JRia Bush MD

## 2020-03-15 NOTE — Assessment & Plan Note (Signed)
Chronic, stable on breo daily.  Albuterol refilled.

## 2020-03-15 NOTE — Assessment & Plan Note (Signed)
Chronic, stable. Continue lipitor. The 10-year ASCVD risk score Mikey Bussing DC Brooke Bonito., et al., 2013) is: 25.4%   Values used to calculate the score:     Age: 69 years     Sex: Female     Is Non-Hispanic African American: No     Diabetic: Yes     Tobacco smoker: No     Systolic Blood Pressure: 409 mmHg     Is BP treated: Yes     HDL Cholesterol: 53 mg/dL     Total Cholesterol: 159 mg/dL

## 2020-03-15 NOTE — Assessment & Plan Note (Signed)
Preventative protocols reviewed and updated unless pt declined. Discussed healthy diet and lifestyle.  

## 2020-03-15 NOTE — Patient Instructions (Addendum)
If interested, check with pharmacy about new 2 shot shingles series (shingrix).  Work on low sugar diet to keep diabetes under control.  Add astelin nasal spray for allergy control Return as needed or in 6 months for diabetes follow up.   Health Maintenance After Age 71 After age 60, you are at a higher risk for certain long-term diseases and infections as well as injuries from falls. Falls are a major cause of broken bones and head injuries in people who are older than age 30. Getting regular preventive care can help to keep you healthy and well. Preventive care includes getting regular testing and making lifestyle changes as recommended by your health care provider. Talk with your health care provider about:  Which screenings and tests you should have. A screening is a test that checks for a disease when you have no symptoms.  A diet and exercise plan that is right for you. What should I know about screenings and tests to prevent falls? Screening and testing are the best ways to find a health problem early. Early diagnosis and treatment give you the best chance of managing medical conditions that are common after age 4. Certain conditions and lifestyle choices may make you more likely to have a fall. Your health care provider may recommend:  Regular vision checks. Poor vision and conditions such as cataracts can make you more likely to have a fall. If you wear glasses, make sure to get your prescription updated if your vision changes.  Medicine review. Work with your health care provider to regularly review all of the medicines you are taking, including over-the-counter medicines. Ask your health care provider about any side effects that may make you more likely to have a fall. Tell your health care provider if any medicines that you take make you feel dizzy or sleepy.  Osteoporosis screening. Osteoporosis is a condition that causes the bones to get weaker. This can make the bones weak and cause  them to break more easily.  Blood pressure screening. Blood pressure changes and medicines to control blood pressure can make you feel dizzy.  Strength and balance checks. Your health care provider may recommend certain tests to check your strength and balance while standing, walking, or changing positions.  Foot health exam. Foot pain and numbness, as well as not wearing proper footwear, can make you more likely to have a fall.  Depression screening. You may be more likely to have a fall if you have a fear of falling, feel emotionally low, or feel unable to do activities that you used to do.  Alcohol use screening. Using too much alcohol can affect your balance and may make you more likely to have a fall. What actions can I take to lower my risk of falls? General instructions  Talk with your health care provider about your risks for falling. Tell your health care provider if: ? You fall. Be sure to tell your health care provider about all falls, even ones that seem minor. ? You feel dizzy, sleepy, or off-balance.  Take over-the-counter and prescription medicines only as told by your health care provider. These include any supplements.  Eat a healthy diet and maintain a healthy weight. A healthy diet includes low-fat dairy products, low-fat (lean) meats, and fiber from whole grains, beans, and lots of fruits and vegetables. Home safety  Remove any tripping hazards, such as rugs, cords, and clutter.  Install safety equipment such as grab bars in bathrooms and safety rails on stairs.  Keep rooms and walkways well-lit. Activity   Follow a regular exercise program to stay fit. This will help you maintain your balance. Ask your health care provider what types of exercise are appropriate for you.  If you need a cane or walker, use it as recommended by your health care provider.  Wear supportive shoes that have nonskid soles. Lifestyle  Do not drink alcohol if your health care provider  tells you not to drink.  If you drink alcohol, limit how much you have: ? 0-1 drink a day for women. ? 0-2 drinks a day for men.  Be aware of how much alcohol is in your drink. In the [place] one drink equals one typical bottle of beer (12 oz), one-half glass of wine (5 oz), or one shot of hard liquor (1 oz).  Do not use any products that contain nicotine or tobacco, such as cigarettes and e-cigarettes. If you need help quitting, ask your health care provider. Summary  Having a healthy lifestyle and getting preventive care can help to protect your health and wellness after age 89.  Screening and testing are the best way to find a health problem early and help you avoid having a fall. Early diagnosis and treatment give you the best chance for managing medical conditions that are more common for people who are older than age 73.  Falls are a major cause of broken bones and head injuries in people who are older than age 73. Take precautions to prevent a fall at home.  Work with your health care provider to learn what changes you can make to improve your health and wellness and to prevent falls. This information is not intended to replace advice given to you by your health care provider. Make sure you discuss any questions you have with your health care provider. Document Revised: 12/03/2018 Document Reviewed: 06/25/2017 Elsevier Patient Education  2020 [employer].

## 2020-04-09 ENCOUNTER — Other Ambulatory Visit: Payer: Self-pay | Admitting: Family Medicine

## 2020-08-07 ENCOUNTER — Encounter: Payer: Self-pay | Admitting: Internal Medicine

## 2020-08-07 ENCOUNTER — Telehealth: Payer: Self-pay

## 2020-08-07 ENCOUNTER — Telehealth (INDEPENDENT_AMBULATORY_CARE_PROVIDER_SITE_OTHER): Payer: PPO | Admitting: Internal Medicine

## 2020-08-07 VITALS — BP 152/66 | HR 58 | Temp 97.7°F

## 2020-08-07 DIAGNOSIS — J069 Acute upper respiratory infection, unspecified: Secondary | ICD-10-CM | POA: Diagnosis not present

## 2020-08-07 MED ORDER — AMOXICILLIN-POT CLAVULANATE 875-125 MG PO TABS
1.0000 | ORAL_TABLET | Freq: Two times a day (BID) | ORAL | 0 refills | Status: DC
Start: 2020-08-07 — End: 2020-09-18

## 2020-08-07 NOTE — Patient Instructions (Signed)

## 2020-08-07 NOTE — Telephone Encounter (Signed)
[facility] Night - Client TELEPHONE ADVICE RECORD [facility] Patient Name: Jennifer Pineda Gender: Female DOB: 22-Feb-1951 Age: 69 Y 73 M 17 D Return Phone Number: [phone] (Primary) Address: City/State/Zip: [place] [address] Client [name] - Client Client Site [place] Physician Ria Bush - MD Contact Type Call Who Is Calling Patient / Member / Family / Caregiver Call Type Triage / Clinical Relationship To Patient Self Return Phone Number [phone] (Primary) Chief Complaint Ear Fullness or Congestion Reason for Call Symptomatic / Request for [place] says she was told to call concerning her sinuses, says its draining down her throat and now coughing, says her left ear is stopped up Translation No Nurse Assessment Nurse: Altamease Oiler, RN, Anastasia Fiedler Date/Time ([place] Time): 08/05/2020 3:34:26 PM Confirm and document reason for call. If symptomatic, describe symptoms. ---Caller states she is having sinus congestion and her left ear hurts. No fever. Caller states she is having drainage in the back of her throat and is choking with the drainage. Does the patient have any new or worsening symptoms? ---Yes Will a triage be completed? ---Yes Related visit to physician within the last 2 weeks? ---No Does the PT have any chronic conditions? (i.e. diabetes, asthma, this includes High risk factors for pregnancy, etc.) ---Yes List chronic conditions. ---HTN Triglycerides Asthma Is this a behavioral health or substance abuse call? ---No Guidelines Guideline Title Affirmed Question Affirmed Notes Nurse Date/Time Eilene Ghazi Time) Sinus Pain or Congestion Shireen Quan, RN, Anastasia Fiedler 08/05/2020 3:36:37 PM Disp. Time Eilene Ghazi Time) Disposition Final User 08/05/2020 3:39:58 PM See PCP within 24 Hours Yes Altamease Oiler, RN, Orlene Och Disagree/Comply Comply Caller  Understands Yes PreDisposition Did not know what to do PLEASE NOTE: All timestamps contained within this report are represented as [place] Standard Time. CONFIDENTIALTY NOTICE: This fax transmission is intended only for the addressee. It contains information that is legally privileged, confidential or otherwise protected from use or disclosure. If you are not the intended recipient, you are strictly prohibited from reviewing, disclosing, copying using or disseminating any of this information or taking any action in reliance on or regarding this information. If you have received this fax in error, please notify us immediately by telephone so that we can arrange for its return to [place]. Phone: [phone], Toll-Free: [phone], Fax: [phone] Page: 2 of 2 Call Id: [id number] Care Advice Given Per Guideline SEE PCP WITHIN 24 HOURS: * IF OFFICE WILL BE CLOSED: You need to be seen within the next 24 hours. A clinic or an urgent care center is often a good source of care if your doctor's office is closed or you can't get an appointment. CALL BACK IF: * Difficulty breathing (and not relieved by cleaning out nose) * You become worse CARE ADVICE given per Sinus Pain or Congestion (Adult) guideline. Referrals GO TO FACILITY UNDECIDED

## 2020-08-07 NOTE — Progress Notes (Signed)
Virtual Visit via Video Note  I connected with Jennifer Pineda on 08/07/20 at 10:30 AM EST by a video enabled telemedicine application and verified that I am speaking with the correct person using two identifiers.  Location: Patient: Home Provider: Office  Person's participating in this video call: Webb Silversmith, NP-C and Lyle Leisner   I discussed the limitations of evaluation and management by telemedicine and the availability of in person appointments. The patient expressed understanding and agreed to proceed.  History of Present Illness:  Pt reports left ear pain, nasal congestion, post nasal drip and cough. This started 2 weeks ago but has worsened in the last 2-3 days. She is blowing yellow-green mucous out of her nose. She describes the ear pain as fullness, without drainage or loss of hearing. She denies difficulty swallowing. The cough is mostly non productive. She denies headaches, dizziness, runny nose, sore throat, loss of taste/smell or SOB. She denies fever, chills or body aches. She has tried Mucinex, Robitussin, ear drop and Flonase without any relief of symptoms. She had Covid in February and reports this feels different. She denies Covid exposure that she is aware of and has had her Covid vaccines. She has a history of asthma, COPD and DM2.   Past Medical History:  Diagnosis Date  . Allergic rhinitis   . Anxiety and depression   . Arthritis    hands  . Asthma, persistent   . Basal cell carcinoma 12/2019   s/p excision, margins clear (Dr Pearline Cables)  . CAD (coronary artery disease)    per CT 06-02-2015  . Complication of anesthesia    slow to wake  . COPD, mild ([place])    pulmologist--  dr Vilinda Boehringer ([facility] in [place])  . Diet-controlled diabetes mellitus ([place]) 05/01/2011   Completed DSME 10/2015   . Diverticulosis of colon   . Essential hypertension   . GERD (gastroesophageal reflux disease)   . History of cellulitis    Nov 2015-- nasal cellulitis--   resolved  . History of endometriosis   . History of hiatal hernia   . Hyperlipidemia   . OSA on CPAP    severe per study 05-09-2010  . PONV (postoperative nausea and vomiting)   . Shingles 12/29/2006  . Sleep apnea   . Urothelial carcinoma ([place]) 2016   superficial papillary L lateral bladder wall s/p BCG therapy ([place])    Current Outpatient Medications  Medication Sig Dispense Refill  . albuterol (VENTOLIN HFA) 108 (90 Base) MCG/ACT inhaler Inhale 2 puffs into the lungs every 6 (six) hours as needed. For shortness of breath. 18 g 6  . amLODipine (NORVASC) 10 MG tablet Take 1 tablet (10 mg total) by mouth daily. 90 tablet 3  . atorvastatin (LIPITOR) 10 MG tablet Take 1 tablet (10 mg total) by mouth daily. 90 tablet 3  . azelastine (ASTELIN) 0.1 % nasal spray Place 1 spray into both nostrils 2 (two) times daily. Use in each nostril as directed 30 mL 12  . Blood Glucose Monitoring Suppl (ONE TOUCH ULTRA SYSTEM KIT) w/Device KIT 1 kit by Does not apply route once. 1 each 0  . FLUoxetine (PROZAC) 10 MG capsule Take 1 capsule (10 mg total) by mouth daily. 90 capsule 3  . fluticasone (CUTIVATE) 0.05 % cream Apply topically. For lips    . fluticasone (FLONASE) 50 MCG/ACT nasal spray Use 2 spray(s) in each nostril once daily 48 g 3  . fluticasone furoate-vilanterol (BREO ELLIPTA) 100-25 MCG/INH AEPB Inhale 1  puff into the lungs daily. 90 each 3  . GENTAMICIN SULFATE EX Apply topically. For nose    . glucose blood (ONE TOUCH ULTRA TEST) test strip 1 each by Other route daily. Use to check sugar once daily and as needed. Dx: E11.9 100 each 3  . lisinopril (ZESTRIL) 5 MG tablet Take 1 tablet (5 mg total) by mouth daily. 90 tablet 3  . loratadine (CLARITIN) 10 MG tablet TAKE 1 TABLET BY MOUTH ONCE DAILY 90 tablet 3  . Multiple Vitamins-Calcium (VIACTIV MULTI-VITAMIN) CHEW Chew 1 tablet by mouth daily.    . Multiple Vitamins-Minerals (PRESERVISION AREDS PO) Take by mouth at bedtime.    Marland Kitchen omeprazole  (PRILOSEC) 40 MG capsule Take 1 capsule (40 mg total) by mouth daily. 90 capsule 3  . ONETOUCH DELICA LANCETS FINE MISC Use as directed - check as needed or 3 times weekly 100 each 1  . triamterene-hydrochlorothiazide (MAXZIDE-25) 37.5-25 MG tablet Take 1 tablet by mouth daily. 90 tablet 3   No current facility-administered medications for this visit.    Allergies  Allergen Reactions  . Levofloxacin Shortness Of Breath and Swelling  . Ampicillin Diarrhea and Other (See Comments)     stomach upset, tolerated amoxicillin ok  . Doxycycline Hyclate Diarrhea and Other (See Comments)     stomach upset  . Metoprolol Succinate Diarrhea and Other (See Comments)    stomach upset  . Other Swelling    Horseradish  . Tetracycline Hcl Diarrhea and Other (See Comments)     stomach upset  . Adhesive [Tape] Rash    Family History  Problem Relation Age of Onset  . Cancer Mother        kidney and lymphoma CA, neprectomy tumor of lung, non smoker  . Kidney disease Mother        T-cell cancer in kidney  . Aneurysm Father        ? in throat  . Diabetes Father   . Heart disease Father        MI  . Diabetes Sister   . Hyperlipidemia Sister   . Heart disease Brother 56       MI  . Alcohol abuse Brother        smokes again  . Hypertension Brother   . Cancer Brother 46       T-cell renal cancer/ removed  . Hypertension Brother   . Colon cancer Neg Hx   . Esophageal cancer Neg Hx   . Rectal cancer Neg Hx   . Stomach cancer Neg Hx   . Breast cancer Neg Hx     Social History   Socioeconomic History  . Marital status: Divorced    Spouse name: Not on file  . Number of children: 2  . Years of education: Not on file  . Highest education level: Not on file  Occupational History  . Occupation: Event organiser: [employer]    Comment: [employer]  Tobacco Use  . Smoking status: Former Smoker    Packs/day: 2.00    Years: 19.00    Pack years: 38.00    Types:  Cigarettes    Quit date: 08/26/1986    Years since quitting: 33.9  . Smokeless tobacco: Never Used  Vaping Use  . Vaping Use: Never used  Substance and Sexual Activity  . Alcohol use: Yes    Comment: 3 margaritas a week   . Drug use: No  . Sexual activity: Never  Other Topics Concern  . Not on file  Social History Narrative   Divorced   Daughter [name], lives with her   Children: 2 children // one daughter lives with mother/ son lives with father   Activity: 20 min walk daily   Diet: good water, fruits/vegetables daily.    Social Determinants of Health   Financial Resource Strain: Low Risk   . Difficulty of Paying Living Expenses: Not hard at all  Food Insecurity: No Food Insecurity  . Worried About Charity fundraiser in the Last Year: Never true  . Ran Out of Food in the Last Year: Never true  Transportation Needs: No Transportation Needs  . Lack of Transportation (Medical): No  . Lack of Transportation (Non-Medical): No  Physical Activity: Inactive  . Days of Exercise per Week: 0 days  . Minutes of Exercise per Session: 0 min  Stress: Stress Concern Present  . Feeling of Stress : To some extent  Social Connections: Not on file  Intimate Partner Violence: Not At Risk  . Fear of Current or Ex-Partner: No  . Emotionally Abused: No  . Physically Abused: No  . Sexually Abused: No     Constitutional: Denies fever, malaise, fatigue, headache or abrupt weight changes.  HEENT: Pt reports left ear fullness, nasal congestion, post nasal drip. Denies eye pain, eye redness,  ringing in the ears, wax buildup, runny nose, bloody nose, or sore throat. Respiratory: Pt reports cough. Denies difficulty breathing, shortness of breath, or sputum production.   Cardiovascular: Denies chest pain, chest tightness, palpitations or swelling in the hands or feet.  G No other specific complaints in a complete review of systems (except as listed in HPI  above).  Observations/Objective: BP (!) 152/66   Pulse (!) 58   Temp 97.7 F (36.5 C) (Temporal)   SpO2 98%   Wt Readings from Last 3 Encounters:  03/15/20 188 lb 8 oz (85.5 kg)  09/24/19 181 lb (82.1 kg)  07/02/19 186 lb (84.4 kg)    General: Appears her stated age, in NAD. HEENT: Head: normal shape and size; Nose: congested; Throat/Mouth: hoarseness noted Pulmonary/Chest: Normal effort. Dry cough noted. No respiratory distress.  Neurological: Alert and oriented.   BMET    Component Value Date/Time   NA 139 03/08/2020 0803   K 4.1 03/08/2020 0803   CL 100 03/08/2020 0803   CO2 30 03/08/2020 0803   GLUCOSE 140 (H) 03/08/2020 0803   BUN 14 03/08/2020 0803   CREATININE 0.74 03/08/2020 0803   CALCIUM 9.3 03/08/2020 0803   GFRNONAA 77.61 11/14/2009 0818    Lipid Panel     Component Value Date/Time   CHOL 159 03/08/2020 0803   TRIG 116.0 03/08/2020 0803   HDL 53.00 03/08/2020 0803   CHOLHDL 3 03/08/2020 0803   VLDL 23.2 03/08/2020 0803   LDLCALC 83 03/08/2020 0803    CBC    Component Value Date/Time   WBC 10.7 (H) 11/04/2016 0828   RBC 4.86 11/04/2016 0828   HGB 13.8 11/04/2016 0828   HCT 41.1 11/04/2016 0828   PLT 294.0 11/04/2016 0828   MCV 84.4 11/04/2016 0828   MCH 26.3 04/28/2012 0646   MCHC 33.6 11/04/2016 0828   RDW 14.7 11/04/2016 0828   LYMPHSABS 2.7 11/04/2016 0828   MONOABS 0.8 11/04/2016 0828   EOSABS 0.3 11/04/2016 0828   BASOSABS 0.1 11/04/2016 0828    Hgb A1C Lab Results  Component Value Date   HGBA1C 6.9 (H) 03/08/2020  Assessment and Plan:  Upper Respiratory Infection with Cough:  Given duration of symptoms, will treat with Augmentin 875-125 mg BID x 10 days Continue Flonase Could consider Pred Taper if symptoms do not improve No indication for covid testing at this time  Return precautions discussed  Follow Up Instructions:    I discussed the assessment and treatment plan with the patient. The patient was  provided an opportunity to ask questions and all were answered. The patient agreed with the plan and demonstrated an understanding of the instructions.   The patient was advised to call back or seek an in-person evaluation if the symptoms worsen or if the condition fails to improve as anticipated.    Webb Silversmith, NP

## 2020-08-07 NOTE — Telephone Encounter (Signed)
Will discuss at upcoming appt.

## 2020-08-07 NOTE — Telephone Encounter (Signed)
I spoke with Jennifer Pineda; Jennifer Pineda did not go to UC; lt ear feels better using OTC ear drops; when blows nose mucus is yellow and green mucus; usually dry cough, Jennifer Pineda started with symptoms for months with sinus issues. The nasal spray given by DR G did not help. Lt ear has been bothering her for 2 wks. Jennifer Pineda said no covid symptoms. No CP,SOB,H/A or dizziness. Jennifer Pineda does not want to go to UC. Jennifer Pineda scheduled video visit with R Baity NP 08/07/20 at 10:30. Jennifer Pineda will have vitals ready. UC precautions given and Jennifer Pineda voiced understanding.

## 2020-09-18 ENCOUNTER — Other Ambulatory Visit: Payer: Self-pay

## 2020-09-18 ENCOUNTER — Telehealth: Payer: Self-pay | Admitting: Family Medicine

## 2020-09-18 ENCOUNTER — Encounter: Payer: Self-pay | Admitting: Family Medicine

## 2020-09-18 ENCOUNTER — Ambulatory Visit (INDEPENDENT_AMBULATORY_CARE_PROVIDER_SITE_OTHER): Payer: PPO | Admitting: Family Medicine

## 2020-09-18 VITALS — BP 148/60 | HR 70 | Temp 97.9°F | Ht 63.5 in | Wt 191.5 lb

## 2020-09-18 DIAGNOSIS — Z23 Encounter for immunization: Secondary | ICD-10-CM

## 2020-09-18 DIAGNOSIS — E119 Type 2 diabetes mellitus without complications: Secondary | ICD-10-CM | POA: Diagnosis not present

## 2020-09-18 DIAGNOSIS — Z8551 Personal history of malignant neoplasm of bladder: Secondary | ICD-10-CM | POA: Diagnosis not present

## 2020-09-18 DIAGNOSIS — I10 Essential (primary) hypertension: Secondary | ICD-10-CM

## 2020-09-18 LAB — POCT GLYCOSYLATED HEMOGLOBIN (HGB A1C): Hemoglobin A1C: 7.1 % — AB (ref 4.0–5.6)

## 2020-09-18 MED ORDER — LISINOPRIL 10 MG PO TABS: 10.0000 mg | ORAL_TABLET | Freq: Every day | ORAL | 1 refills | Status: DC

## 2020-09-18 MED ORDER — GLUCOSE BLOOD VI STRP: 1.0000 | ORAL_STRIP | Freq: Every day | 3 refills | Status: AC

## 2020-09-18 NOTE — Telephone Encounter (Signed)
Please phone in onetouch ultra test strips due to E prescribing failure

## 2020-09-18 NOTE — Patient Instructions (Addendum)
Increase lisinopril to 10mg  daily.  Sugars too high - start metformin 500mg  daily with breakfast.  Work on low carb diet and weight.  Good to see you today.  Return in 6 months for physical/wellness visit.   Diabetes Mellitus and Nutrition, Adult When you have diabetes, or diabetes mellitus, it is very important to have healthy eating habits because your blood sugar (glucose) levels are greatly affected by what you eat and drink. Eating healthy foods in the right amounts, at about the same times every day, can help you:  Control your blood glucose.  Lower your risk of heart disease.  Improve your blood pressure.  Reach or maintain a healthy weight. What can affect my meal plan? Every person with diabetes is different, and each person has different needs for a meal plan. Your health care provider may recommend that you work with a dietitian to make a meal plan that is best for you. Your meal plan may vary depending on factors such as:  The calories you need.  The medicines you take.  Your weight.  Your blood glucose, blood pressure, and cholesterol levels.  Your activity level.  Other health conditions you have, such as heart or kidney disease. How do carbohydrates affect me? Carbohydrates, also called carbs, affect your blood glucose level more than any other type of food. Eating carbs naturally raises the amount of glucose in your blood. Carb counting is a method for keeping track of how many carbs you eat. Counting carbs is important to keep your blood glucose at a healthy level, especially if you use insulin or take certain oral diabetes medicines. It is important to know how many carbs you can safely have in each meal. This is different for every person. Your dietitian can help you calculate how many carbs you should have at each meal and for each snack. How does alcohol affect me? Alcohol can cause a sudden decrease in blood glucose (hypoglycemia), especially if you use insulin  or take certain oral diabetes medicines. Hypoglycemia can be a life-threatening condition. Symptoms of hypoglycemia, such as sleepiness, dizziness, and confusion, are similar to symptoms of having too much alcohol.  Do not drink alcohol if: ? Your health care provider tells you not to drink. ? You are pregnant, may be pregnant, or are planning to become pregnant.  If you drink alcohol: ? Do not drink on an empty stomach. ? Limit how much you use to:  0-1 drink a day for women.  0-2 drinks a day for men. ? Be aware of how much alcohol is in your drink. In the [place] one drink equals one 12 oz bottle of beer (355 mL), one 5 oz glass of wine (148 mL), or one 1 oz glass of hard liquor (44 mL). ? Keep yourself hydrated with water, diet soda, or unsweetened iced tea.  Keep in mind that regular soda, juice, and other mixers may contain a lot of sugar and must be counted as carbs. What are tips for following this plan? Reading food labels  Start by checking the serving size on the "Nutrition Facts" label of packaged foods and drinks. The amount of calories, carbs, fats, and other nutrients listed on the label is based on one serving of the item. Many items contain more than one serving per package.  Check the total grams (g) of carbs in one serving. You can calculate the number of servings of carbs in one serving by dividing the total carbs by 15. For example,  if a food has 30 g of total carbs per serving, it would be equal to 2 servings of carbs.  Check the number of grams (g) of saturated fats and trans fats in one serving. Choose foods that have a low amount or none of these fats.  Check the number of milligrams (mg) of salt (sodium) in one serving. Most people should limit total sodium intake to less than 2,300 mg per day.  Always check the nutrition information of foods labeled as "low-fat" or "nonfat." These foods may be higher in added sugar or refined carbs and should be avoided.  Talk  to your dietitian to identify your daily goals for nutrients listed on the label. Shopping  Avoid buying canned, pre-made, or processed foods. These foods tend to be high in fat, sodium, and added sugar.  Shop around the outside edge of the grocery store. This is where you will most often find fresh fruits and vegetables, bulk grains, fresh meats, and fresh dairy. Cooking  Use low-heat cooking methods, such as baking, instead of high-heat cooking methods like deep frying.  Cook using healthy oils, such as olive, canola, or sunflower oil.  Avoid cooking with butter, cream, or high-fat meats. Meal planning  Eat meals and snacks regularly, preferably at the same times every day. Avoid going long periods of time without eating.  Eat foods that are high in fiber, such as fresh fruits, vegetables, beans, and whole grains. Talk with your dietitian about how many servings of carbs you can eat at each meal.  Eat 4-6 oz (112-168 g) of lean protein each day, such as lean meat, chicken, fish, eggs, or tofu. One ounce (oz) of lean protein is equal to: ? 1 oz (28 g) of meat, chicken, or fish. ? 1 egg. ?  cup (62 g) of tofu.  Eat some foods each day that contain healthy fats, such as avocado, nuts, seeds, and fish.   What foods should I eat? Fruits Berries. Apples. Oranges. Peaches. Apricots. Plums. Grapes. Mango. Papaya. Pomegranate. Kiwi. Cherries. Vegetables Lettuce. Spinach. Leafy greens, including kale, chard, collard greens, and mustard greens. Beets. Cauliflower. Cabbage. Broccoli. Carrots. Green beans. Tomatoes. Peppers. Onions. Cucumbers. Brussels sprouts. Grains Whole grains, such as whole-wheat or whole-grain bread, crackers, tortillas, cereal, and pasta. Unsweetened oatmeal. Quinoa. Brown or wild rice. Meats and other proteins Seafood. Poultry without skin. Lean cuts of poultry and beef. Tofu. Nuts. Seeds. Dairy Low-fat or fat-free dairy products such as milk, yogurt, and  cheese. The items listed above may not be a complete list of foods and beverages you can eat. Contact a dietitian for more information. What foods should I avoid? Fruits Fruits canned with syrup. Vegetables Canned vegetables. Frozen vegetables with butter or cream sauce. Grains Refined white flour and flour products such as bread, pasta, snack foods, and cereals. Avoid all processed foods. Meats and other proteins Fatty cuts of meat. Poultry with skin. Breaded or fried meats. Processed meat. Avoid saturated fats. Dairy Full-fat yogurt, cheese, or milk. Beverages Sweetened drinks, such as soda or iced tea. The items listed above may not be a complete list of foods and beverages you should avoid. Contact a dietitian for more information. Questions to ask a health care provider  Do I need to meet with a diabetes educator?  Do I need to meet with a dietitian?  What number can I call if I have questions?  When are the best times to check my blood glucose? Where to find more information:  American Diabetes Association: diabetes.org  Academy of Nutrition and Dietetics: www.eatright.[employer] of Diabetes and Digestive and Kidney Diseases: [employer]  Association of Diabetes Care and Education Specialists: www.diabeteseducator.org Summary  It is important to have healthy eating habits because your blood sugar (glucose) levels are greatly affected by what you eat and drink.  A healthy meal plan will help you control your blood glucose and maintain a healthy lifestyle.  Your health care provider may recommend that you work with a dietitian to make a meal plan that is best for you.  Keep in mind that carbohydrates (carbs) and alcohol have immediate effects on your blood glucose levels. It is important to count carbs and to use alcohol carefully. This information is not intended to replace advice given to you by your health care provider. Make sure you discuss any  questions you have with your health care provider. Document Revised: 07/20/2019 Document Reviewed: 07/20/2019 Elsevier Patient Education  2021 [employer].

## 2020-09-18 NOTE — Assessment & Plan Note (Signed)
Regularly sees urology yearly.

## 2020-09-18 NOTE — Assessment & Plan Note (Signed)
Chronic, deteriorated.  Reviewed diet choices, encouraged renewed efforts at low carb diet and goal weight loss.  Start metformin. Reviewed side effects to watch for.  Foot exam today.

## 2020-09-18 NOTE — Progress Notes (Signed)
Patient ID: Jennifer Pineda, female    DOB: 07-Mar-1951, 70 y.o.   MRN: [id number]  This visit was conducted in person.  BP (!) 148/60 (BP Location: Right Arm, Patient Position: Sitting, Cuff Size: Large)   Pulse 70   Temp 97.9 F (36.6 C) (Temporal)   Ht 5' 3.5" (1.613 m)   Wt 191 lb 8 oz (86.9 kg)   SpO2 95%   BMI 33.39 kg/m    CC: 6 mo DM f/u visit  Subjective:   HPI: Jennifer Pineda is a 70 y.o. female presenting on 09/18/2020 for Diabetes (Here for 6 mo f/u.)   HTN - Compliant with current antihypertensive regimen of amlodipine 68m daily, lisinopril 556mdaily and maxzide 37.5/25mg daily. Does check blood pressures at home: 140-150/70s. No low blood pressure readings or symptoms of dizziness/syncope. Denies HA, vision changes, CP/tightness, SOB, leg swelling.   DM - does regularly check sugars 109-260s throughout the day. Compliant with antihyperglycemic regimen which includes: diet controlled - but has been eating more bread/carb recently. Denies low sugars or hypoglycemic symptoms. Denies paresthesias. Last diabetic eye exam 12/2019. Pneumovax: 2017. Prevnar: 2015. Glucometer brand: one-touch. DSME: completed 10/2015.  Lab Results  Component Value Date   HGBA1C 7.1 (A) 09/18/2020   Diabetic Foot Exam - Simple   Simple Foot Form Diabetic Foot exam was performed with the following findings: Yes 09/18/2020  8:21 AM  Visual Inspection No deformities, no ulcerations, no other skin breakdown bilaterally: Yes Sensation Testing Intact to touch and monofilament testing bilaterally: Yes Pulse Check Posterior Tibialis and Dorsalis pulse intact bilaterally: Yes Comments    Lab Results  Component Value Date   MICROALBUR 2.9 (H) 02/18/2019         Relevant past medical, surgical, family and social history reviewed and updated as indicated. Interim medical history since our last visit reviewed. Allergies and medications reviewed and updated. Outpatient Medications Prior to  Visit  Medication Sig Dispense Refill  . albuterol (VENTOLIN HFA) 108 (90 Base) MCG/ACT inhaler Inhale 2 puffs into the lungs every 6 (six) hours as needed. For shortness of breath. 18 g 6  . amLODipine (NORVASC) 10 MG tablet Take 1 tablet (10 mg total) by mouth daily. 90 tablet 3  . atorvastatin (LIPITOR) 10 MG tablet Take 1 tablet (10 mg total) by mouth daily. 90 tablet 3  . Blood Glucose Monitoring Suppl (ONE TOUCH ULTRA SYSTEM KIT) w/Device KIT 1 kit by Does not apply route once. 1 each 0  . FLUoxetine (PROZAC) 10 MG capsule Take 1 capsule (10 mg total) by mouth daily. 90 capsule 3  . fluticasone (CUTIVATE) 0.05 % cream Apply topically. For lips    . fluticasone (FLONASE) 50 MCG/ACT nasal spray Use 2 spray(s) in each nostril once daily 48 g 3  . fluticasone furoate-vilanterol (BREO ELLIPTA) 100-25 MCG/INH AEPB Inhale 1 puff into the lungs daily. 90 each 3  . GENTAMICIN SULFATE EX Apply topically. For nose    . loratadine (CLARITIN) 10 MG tablet TAKE 1 TABLET BY MOUTH ONCE DAILY 90 tablet 3  . Multiple Vitamins-Calcium (VIACTIV MULTI-VITAMIN) CHEW Chew 1 tablet by mouth daily.    . Multiple Vitamins-Minerals (PRESERVISION AREDS PO) Take by mouth at bedtime.    . Marland Kitchenmeprazole (PRILOSEC) 40 MG capsule Take 1 capsule (40 mg total) by mouth daily. 90 capsule 3  . ONETOUCH DELICA LANCETS FINE MISC Use as directed - check as needed or 3 times weekly 100 each 1  . triamterene-hydrochlorothiazide (MAXZIDE-25)  37.5-25 MG tablet Take 1 tablet by mouth daily. 90 tablet 3  . glucose blood (ONE TOUCH ULTRA TEST) test strip 1 each by Other route daily. Use to check sugar once daily and as needed. Dx: E11.9 100 each 3  . lisinopril (ZESTRIL) 5 MG tablet Take 1 tablet (5 mg total) by mouth daily. 90 tablet 3  . amoxicillin-clavulanate (AUGMENTIN) 875-125 MG tablet Take 1 tablet by mouth 2 (two) times daily. 20 tablet 0  . azelastine (ASTELIN) 0.1 % nasal spray Place 1 spray into both nostrils 2 (two) times  daily. Use in each nostril as directed 30 mL 12   No facility-administered medications prior to visit.     Per HPI unless specifically indicated in ROS section below Review of Systems Objective:  BP (!) 148/60 (BP Location: Right Arm, Patient Position: Sitting, Cuff Size: Large)   Pulse 70   Temp 97.9 F (36.6 C) (Temporal)   Ht 5' 3.5" (1.613 m)   Wt 191 lb 8 oz (86.9 kg)   SpO2 95%   BMI 33.39 kg/m   Wt Readings from Last 3 Encounters:  09/18/20 191 lb 8 oz (86.9 kg)  03/15/20 188 lb 8 oz (85.5 kg)  09/24/19 181 lb (82.1 kg)      Physical Exam Vitals and nursing note reviewed.  Constitutional:      General: She is not in acute distress.    Appearance: Normal appearance. She is well-developed and well-nourished. She is obese. She is not ill-appearing.  HENT:     Mouth/Throat:     Mouth: Oropharynx is clear and moist.  Eyes:     General: No scleral icterus.    Extraocular Movements: Extraocular movements intact and EOM normal.     Conjunctiva/sclera: Conjunctivae normal.     Pupils: Pupils are equal, round, and reactive to light.  Cardiovascular:     Rate and Rhythm: Normal rate and regular rhythm.     Pulses: Normal pulses and intact distal pulses.     Heart sounds: Normal heart sounds. No murmur heard.   Pulmonary:     Effort: Pulmonary effort is normal. No respiratory distress.     Breath sounds: Normal breath sounds. No wheezing, rhonchi or rales.  Musculoskeletal:        General: No edema. Normal range of motion.     Cervical back: Normal range of motion and neck supple.     Right lower leg: No edema.     Left lower leg: No edema.     Comments: See HPI for foot exam if done  Lymphadenopathy:     Cervical: No cervical adenopathy.  Skin:    General: Skin is warm and dry.     Findings: No rash.  Neurological:     Mental Status: She is alert.  Psychiatric:        Mood and Affect: Mood and affect and mood normal.        Behavior: Behavior normal.        Results for orders placed or performed in visit on 09/18/20  POCT glycosylated hemoglobin (Hb A1C)  Result Value Ref Range   Hemoglobin A1C 7.1 (A) 4.0 - 5.6 %   HbA1c POC (<> result, manual entry)     HbA1c, POC (prediabetic range)     HbA1c, POC (controlled diabetic range)     Assessment & Plan:  This visit occurred during the SARS-CoV-2 public health emergency.  Safety protocols were in place, including screening questions prior to the  visit, additional usage of staff PPE, and extensive cleaning of exam room while observing appropriate contact time as indicated for disinfecting solutions.   Problem List Items Addressed This Visit    History of bladder cancer    Regularly sees urology yearly.       Essential hypertension    BP elevated - increase lisinopril to 68m daily.       Relevant Medications   lisinopril (ZESTRIL) 10 MG tablet   Controlled type 2 diabetes mellitus without complication, without long-term current use of insulin (HCC) - Primary    Chronic, deteriorated.  Reviewed diet choices, encouraged renewed efforts at low carb diet and goal weight loss.  Start metformin. Reviewed side effects to watch for.  Foot exam today.       Relevant Medications   lisinopril (ZESTRIL) 10 MG tablet   Other Relevant Orders   POCT glycosylated hemoglobin (Hb A1C) (Completed)    Other Visit Diagnoses    Need for influenza vaccination       Relevant Orders   Flu Vaccine QUAD High Dose(Fluad) (Completed)       Meds ordered this encounter  Medications  . lisinopril (ZESTRIL) 10 MG tablet    Sig: Take 1 tablet (10 mg total) by mouth daily.    Dispense:  90 tablet    Refill:  1  . glucose blood (ONE TOUCH ULTRA TEST) test strip    Sig: 1 each by Other route daily. Use to check sugar once daily and as needed. Dx: E11.9    Dispense:  100 each    Refill:  3   Orders Placed This Encounter  Procedures  . Flu Vaccine QUAD High Dose(Fluad)  . POCT glycosylated hemoglobin (Hb  A1C)    Patient instructions: Flu shot today  Increase lisinopril to 118mdaily.  Sugars too high - start metformin 50050maily with breakfast.  Work on low carb diet and weight.  Good to see you today.  Return in 6 months for physical/wellness visit.   Follow up plan: Return in about 6 months (around 03/18/2021) for annual exam, prior fasting for blood work.  JavRia BushD

## 2020-09-18 NOTE — Assessment & Plan Note (Signed)
BP elevated - increase lisinopril to 10mg  daily.

## 2020-09-21 NOTE — Telephone Encounter (Signed)
Pt left v/m requesting refill on diabetic test strips and pt thought metformin was going to be prescribed; pt had visit on 09/18/20. Pt request cb when done.

## 2020-09-21 NOTE — Telephone Encounter (Signed)
Refill left on vm at pharmacy for test strips.    Dr. Darnell Level, did you still intend for pt to start metformin 500 mg daily with breakfast?

## 2020-09-22 MED ORDER — METFORMIN HCL 500 MG PO TABS
500.0000 mg | ORAL_TABLET | Freq: Every day | ORAL | 3 refills | Status: DC
Start: 2020-09-22 — End: 2021-03-20

## 2020-09-22 NOTE — Telephone Encounter (Signed)
Metformin sent in.  

## 2020-09-22 NOTE — Addendum Note (Signed)
Addended by: Ria Bush on: 09/22/2020 05:08 PM   Modules accepted: Orders

## 2020-12-14 ENCOUNTER — Telehealth: Payer: Self-pay | Admitting: Family Medicine

## 2020-12-14 NOTE — Chronic Care Management (AMB) (Signed)
  Chronic Care Management   Outreach Note  12/14/2020 Name: JOYCELIN RADLOFF MRN: [id number] DOB: 1951-01-17  Referred by: Ria Bush, MD Reason for referral : No chief complaint on file.   An unsuccessful telephone outreach was attempted today. The patient was referred to the pharmacist for assistance with care management and care coordination.   Follow Up Plan:   Lauretta Grill Upstream Scheduler

## 2021-01-04 ENCOUNTER — Telehealth: Payer: Self-pay | Admitting: Family Medicine

## 2021-01-04 NOTE — Chronic Care Management (AMB) (Signed)
  Chronic Care Management   Outreach Note  01/04/2021 Name: MASAYE GATCHALIAN MRN: [id number] DOB: [birth year]  Referred by: Ria Bush, MD Reason for referral : No chief complaint on file.   A second unsuccessful telephone outreach was attempted today. The patient was referred to pharmacist for assistance with care management and care coordination.  Follow Up Plan:   Lauretta Grill Upstream Scheduler

## 2021-01-08 DIAGNOSIS — H353131 Nonexudative age-related macular degeneration, bilateral, early dry stage: Secondary | ICD-10-CM | POA: Diagnosis not present

## 2021-01-08 LAB — HM DIABETES EYE EXAM

## 2021-01-10 DIAGNOSIS — D414 Neoplasm of uncertain behavior of bladder: Secondary | ICD-10-CM | POA: Diagnosis not present

## 2021-01-10 DIAGNOSIS — R310 Gross hematuria: Secondary | ICD-10-CM | POA: Diagnosis not present

## 2021-01-10 DIAGNOSIS — R8271 Bacteriuria: Secondary | ICD-10-CM | POA: Diagnosis not present

## 2021-01-12 ENCOUNTER — Encounter: Payer: Self-pay | Admitting: Family Medicine

## 2021-01-12 ENCOUNTER — Telehealth: Payer: Self-pay | Admitting: Family Medicine

## 2021-01-12 NOTE — Progress Notes (Signed)
  Chronic Care Management   Outreach Note  01/12/2021 Name: Jennifer Pineda MRN: [id number] DOB: [birth year]  Referred by: Ria Bush, MD Reason for referral : No chief complaint on file.   Third unsuccessful telephone outreach was attempted today. The patient was referred to the pharmacist for assistance with care management and care coordination.   Follow Up Plan:   Tatjana Dellinger Upstream scheduler

## 2021-01-15 ENCOUNTER — Telehealth: Payer: Self-pay | Admitting: Family Medicine

## 2021-01-15 NOTE — Chronic Care Management (AMB) (Signed)
  Chronic Care Management   Note  01/15/2021 Name: Jennifer Pineda MRN: [id number] DOB: [birth year]  Jennifer Pineda is a 70 y.o. year old female who is a primary care patient of Ria Bush, MD. I reached out to Bobbye Morton by phone today in response to a referral sent by Ms. Justice Rocher PCP, Ria Bush, MD.   [name] was given information about Chronic Care Management services today including:  1. CCM service includes personalized support from designated clinical staff supervised by her physician, including individualized plan of care and coordination with other care providers 2. 24/7 contact phone numbers for assistance for urgent and routine care needs. 3. Service will only be billed when office clinical staff spend 20 minutes or more in a month to coordinate care. 4. Only one practitioner may furnish and bill the service in a calendar month. 5. The patient may stop CCM services at any time (effective at the end of the month) by phone call to the office staff.   Patient agreed to services and verbal consent obtained.   Follow up plan:  Tatjana Secretary/administrator

## 2021-01-25 DIAGNOSIS — R310 Gross hematuria: Secondary | ICD-10-CM | POA: Diagnosis not present

## 2021-01-25 DIAGNOSIS — Z8551 Personal history of malignant neoplasm of bladder: Secondary | ICD-10-CM | POA: Diagnosis not present

## 2021-02-27 ENCOUNTER — Telehealth: Payer: PPO

## 2021-03-11 ENCOUNTER — Other Ambulatory Visit: Payer: Self-pay | Admitting: Family Medicine

## 2021-03-11 DIAGNOSIS — R937 Abnormal findings on diagnostic imaging of other parts of musculoskeletal system: Secondary | ICD-10-CM

## 2021-03-11 DIAGNOSIS — E1169 Type 2 diabetes mellitus with other specified complication: Secondary | ICD-10-CM

## 2021-03-11 DIAGNOSIS — E119 Type 2 diabetes mellitus without complications: Secondary | ICD-10-CM

## 2021-03-13 ENCOUNTER — Other Ambulatory Visit (INDEPENDENT_AMBULATORY_CARE_PROVIDER_SITE_OTHER): Payer: PPO

## 2021-03-13 ENCOUNTER — Other Ambulatory Visit: Payer: Self-pay

## 2021-03-13 DIAGNOSIS — R937 Abnormal findings on diagnostic imaging of other parts of musculoskeletal system: Secondary | ICD-10-CM

## 2021-03-13 DIAGNOSIS — E119 Type 2 diabetes mellitus without complications: Secondary | ICD-10-CM | POA: Diagnosis not present

## 2021-03-13 DIAGNOSIS — E785 Hyperlipidemia, unspecified: Secondary | ICD-10-CM

## 2021-03-13 DIAGNOSIS — E1169 Type 2 diabetes mellitus with other specified complication: Secondary | ICD-10-CM | POA: Diagnosis not present

## 2021-03-13 LAB — LIPID PANEL
Cholesterol: 163 mg/dL (ref 0–200)
HDL: 48.4 mg/dL (ref >39.00)
LDL Cholesterol: 77 mg/dL (ref 0–99)
NonHDL: 114.58
Total CHOL/HDL Ratio: 3
Triglycerides: 187 mg/dL — ABNORMAL HIGH (ref 0.0–149.0)
VLDL: 37.4 mg/dL (ref 0.0–40.0)

## 2021-03-13 LAB — COMPREHENSIVE METABOLIC PANEL
ALT: 13 U/L (ref 0–35)
AST: 12 U/L (ref 0–37)
Albumin: 4.2 g/dL (ref 3.5–5.2)
Alkaline Phosphatase: 68 U/L (ref 39–117)
BUN: 17 mg/dL (ref 6–23)
CO2: 31 mEq/L (ref 19–32)
Calcium: 9.3 mg/dL (ref 8.4–10.5)
Chloride: 101 mEq/L (ref 96–112)
Creatinine, Ser: 0.76 mg/dL (ref 0.40–1.20)
GFR: 78.57 mL/min (ref >60.00)
Glucose, Bld: 108 mg/dL — ABNORMAL HIGH (ref 70–99)
Potassium: 4.5 mEq/L (ref 3.5–5.1)
Sodium: 139 mEq/L (ref 135–145)
Total Bilirubin: 0.4 mg/dL (ref 0.2–1.2)
Total Protein: 7.3 g/dL (ref 6.0–8.3)

## 2021-03-13 LAB — CBC WITH DIFFERENTIAL/PLATELET
Basophils Absolute: 0.1 10*3/uL (ref 0.0–0.1)
Basophils Relative: 0.9 % (ref 0.0–3.0)
Eosinophils Absolute: 0.4 10*3/uL (ref 0.0–0.7)
Eosinophils Relative: 4.3 % (ref 0.0–5.0)
HCT: 37.7 % (ref 36.0–46.0)
Hemoglobin: 12.8 g/dL (ref 12.0–15.0)
Lymphocytes Relative: 23.3 % (ref 12.0–46.0)
Lymphs Abs: 2.4 10*3/uL (ref 0.7–4.0)
MCHC: 34 g/dL (ref 30.0–36.0)
MCV: 88.7 fl (ref 78.0–100.0)
Monocytes Absolute: 0.7 10*3/uL (ref 0.1–1.0)
Monocytes Relative: 6.8 % (ref 3.0–12.0)
Neutro Abs: 6.7 10*3/uL (ref 1.4–7.7)
Neutrophils Relative %: 64.7 % (ref 43.0–77.0)
Platelets: 273 10*3/uL (ref 150.0–400.0)
RBC: 4.25 Mil/uL (ref 3.87–5.11)
RDW: 14.4 % (ref 11.5–15.5)
WBC: 10.3 10*3/uL (ref 4.0–10.5)

## 2021-03-13 LAB — HEMOGLOBIN A1C: Hgb A1c MFr Bld: 6.1 % (ref 4.6–6.5)

## 2021-03-20 ENCOUNTER — Other Ambulatory Visit: Payer: Self-pay

## 2021-03-20 ENCOUNTER — Ambulatory Visit (INDEPENDENT_AMBULATORY_CARE_PROVIDER_SITE_OTHER): Payer: PPO | Admitting: Family Medicine

## 2021-03-20 ENCOUNTER — Encounter: Payer: Self-pay | Admitting: Family Medicine

## 2021-03-20 DIAGNOSIS — F341 Dysthymic disorder: Secondary | ICD-10-CM

## 2021-03-20 DIAGNOSIS — E119 Type 2 diabetes mellitus without complications: Secondary | ICD-10-CM

## 2021-03-20 DIAGNOSIS — Z0000 Encounter for general adult medical examination without abnormal findings: Secondary | ICD-10-CM

## 2021-03-20 DIAGNOSIS — E785 Hyperlipidemia, unspecified: Secondary | ICD-10-CM

## 2021-03-20 DIAGNOSIS — G4733 Obstructive sleep apnea (adult) (pediatric): Secondary | ICD-10-CM

## 2021-03-20 DIAGNOSIS — E669 Obesity, unspecified: Secondary | ICD-10-CM

## 2021-03-20 DIAGNOSIS — K219 Gastro-esophageal reflux disease without esophagitis: Secondary | ICD-10-CM

## 2021-03-20 DIAGNOSIS — Z8551 Personal history of malignant neoplasm of bladder: Secondary | ICD-10-CM

## 2021-03-20 DIAGNOSIS — E66811 Obesity, class 1: Secondary | ICD-10-CM

## 2021-03-20 DIAGNOSIS — J4530 Mild persistent asthma, uncomplicated: Secondary | ICD-10-CM

## 2021-03-20 DIAGNOSIS — E1169 Type 2 diabetes mellitus with other specified complication: Secondary | ICD-10-CM

## 2021-03-20 DIAGNOSIS — I10 Essential (primary) hypertension: Secondary | ICD-10-CM

## 2021-03-20 DIAGNOSIS — J449 Chronic obstructive pulmonary disease, unspecified: Secondary | ICD-10-CM

## 2021-03-20 MED ORDER — TRIAMTERENE-HCTZ 37.5-25 MG PO TABS: 1.0000 | ORAL_TABLET | Freq: Every day | ORAL | 3 refills | Status: DC

## 2021-03-20 MED ORDER — ATORVASTATIN CALCIUM 10 MG PO TABS: 10.0000 mg | ORAL_TABLET | Freq: Every day | ORAL | 3 refills | Status: DC

## 2021-03-20 MED ORDER — AMLODIPINE BESYLATE 10 MG PO TABS: 10.0000 mg | ORAL_TABLET | Freq: Every day | ORAL | 3 refills | Status: DC

## 2021-03-20 MED ORDER — LISINOPRIL 10 MG PO TABS: 10.0000 mg | ORAL_TABLET | Freq: Every day | ORAL | 3 refills | Status: DC

## 2021-03-20 MED ORDER — METFORMIN HCL ER 500 MG PO TB24: 500.0000 mg | ORAL_TABLET | Freq: Every day | ORAL | 3 refills | Status: DC

## 2021-03-20 MED ORDER — OMEPRAZOLE 40 MG PO CPDR: 40.0000 mg | DELAYED_RELEASE_CAPSULE | Freq: Every day | ORAL | 3 refills | Status: DC

## 2021-03-20 MED ORDER — FLUTICASONE FUROATE-VILANTEROL 100-25 MCG/INH IN AEPB: 1.0000 | INHALATION_SPRAY | Freq: Every day | RESPIRATORY_TRACT | 3 refills | Status: DC

## 2021-03-20 MED ORDER — FLUTICASONE PROPIONATE 50 MCG/ACT NA SUSP: NASAL | 3 refills | Status: DC

## 2021-03-20 MED ORDER — FLUOXETINE HCL 10 MG PO CAPS: 10.0000 mg | ORAL_CAPSULE | Freq: Every day | ORAL | 3 refills | Status: DC

## 2021-03-20 NOTE — Assessment & Plan Note (Signed)
Continues daily PPI.  

## 2021-03-20 NOTE — Assessment & Plan Note (Signed)
Continues CPAP use - last saw pulm 2019.

## 2021-03-20 NOTE — Assessment & Plan Note (Signed)
Regularly seeing urology for surveillance.

## 2021-03-20 NOTE — Assessment & Plan Note (Signed)
Chronic, stable period on breo daily.

## 2021-03-20 NOTE — Patient Instructions (Addendum)
Call to schedule mammogram at your convenience: [facility] at [facility] [phone] If interested, check with pharmacy about new 2 shot shingles series (shingrix).  Trial metformin XR '500mg'$  daily in place of immediate release metformin. Update [place] with effect.  Good to see you today Return as needed or in 1 year for next physical.   Health Maintenance After Age 72 After age 51, you are at a higher risk for certain long-term diseases and infections as well as injuries from falls. Falls are a major cause of broken bones and head injuries in people who are older than age 36. Getting regular preventive care can help to keep you healthy and well. Preventive care includes getting regular testing and making lifestyle changes as recommended by your health care provider. Talk with your health care provider about: Which screenings and tests you should have. A screening is a test that checks for a disease when you have no symptoms. A diet and exercise plan that is right for you. What should I know about screenings and tests to prevent falls? Screening and testing are the best ways to find a health problem early. Early diagnosis and treatment give you the best chance of managing medical conditions that are common after age 58. Certain conditions and lifestyle choices may make you more likely to have a fall. Your health care provider may recommend: Regular vision checks. Poor vision and conditions such as cataracts can make you more likely to have a fall. If you wear glasses, make sure to get your prescription updated if your vision changes. Medicine review. Work with your health care provider to regularly review all of the medicines you are taking, including over-the-counter medicines. Ask your health care provider about any side effects that may make you more likely to have a fall. Tell your health care provider if any medicines that you take make you feel dizzy or sleepy. Osteoporosis screening.  Osteoporosis is a condition that causes the bones to get weaker. This can make the bones weak and cause them to break more easily. Blood pressure screening. Blood pressure changes and medicines to control blood pressure can make you feel dizzy. Strength and balance checks. Your health care provider may recommend certain tests to check your strength and balance while standing, walking, or changing positions. Foot health exam. Foot pain and numbness, as well as not wearing proper footwear, can make you more likely to have a fall. Depression screening. You may be more likely to have a fall if you have a fear of falling, feel emotionally low, or feel unable to do activities that you used to do. Alcohol use screening. Using too much alcohol can affect your balance and may make you more likely to have a fall. What actions can I take to lower my risk of falls? General instructions Talk with your health care provider about your risks for falling. Tell your health care provider if: You fall. Be sure to tell your health care provider about all falls, even ones that seem minor. You feel dizzy, sleepy, or off-balance. Take over-the-counter and prescription medicines only as told by your health care provider. These include any supplements. Eat a healthy diet and maintain a healthy weight. A healthy diet includes low-fat dairy products, low-fat (lean) meats, and fiber from whole grains, beans, and lots of fruits and vegetables. Home safety Remove any tripping hazards, such as rugs, cords, and clutter. Install safety equipment such as grab bars in bathrooms and safety rails on stairs. Keep  rooms and walkways well-lit. Activity  Follow a regular exercise program to stay fit. This will help you maintain your balance. Ask your health care provider what types of exercise are appropriate for you. If you need a cane or walker, use it as recommended by your health care provider. Wear supportive shoes that have  nonskid soles.  Lifestyle Do not drink alcohol if your health care provider tells you not to drink. If you drink alcohol, limit how much you have: 0-1 drink a day for women. 0-2 drinks a day for men. Be aware of how much alcohol is in your drink. In the [place] one drink equals one typical bottle of beer (12 oz), one-half glass of wine (5 oz), or one shot of hard liquor (1 oz). Do not use any products that contain nicotine or tobacco, such as cigarettes and e-cigarettes. If you need help quitting, ask your health care provider. Summary Having a healthy lifestyle and getting preventive care can help to protect your health and wellness after age 80. Screening and testing are the best way to find a health problem early and help you avoid having a fall. Early diagnosis and treatment give you the best chance for managing medical conditions that are more common for people who are older than age 44. Falls are a major cause of broken bones and head injuries in people who are older than age 71. Take precautions to prevent a fall at home. Work with your health care provider to learn what changes you can make to improve your health and wellness and to prevent falls. This information is not intended to replace advice given to you by your health care provider. Make sure you discuss any questions you have with your healthcare provider. Document Revised: 07/28/2020 Document Reviewed: 07/28/2020 Elsevier Patient Education  2022 [employer].

## 2021-03-20 NOTE — Assessment & Plan Note (Signed)
Chronic, stable. Continue current regimen of lisinopril, maxzide, and amlodipine.

## 2021-03-20 NOTE — Assessment & Plan Note (Signed)
Congratulated on weight loss through healthy diet and lifestyle choices. Pt motivated to continue healthy changes.

## 2021-03-20 NOTE — Assessment & Plan Note (Signed)
Chronic, stable on current regimen of breo. Now off spiriva. Last saw pulm 2019.

## 2021-03-20 NOTE — Assessment & Plan Note (Signed)
Stable period on low dose prozac. Continue

## 2021-03-20 NOTE — Assessment & Plan Note (Signed)
Chronic, stable on low dose lipitor - continue. The 10-year ASCVD risk score Mikey Bussing DC Brooke Bonito., et al., 2013) is: 26.6%   Values used to calculate the score:     Age: 70 years     Sex: Female     Is Non-Hispanic African American: No     Diabetic: Yes     Tobacco smoker: No     Systolic Blood Pressure: [id number] mmHg     Is BP treated: Yes     HDL Cholesterol: 48.4 mg/dL     Total Cholesterol: 163 mg/dL

## 2021-03-20 NOTE — Assessment & Plan Note (Signed)
Preventative protocols reviewed and updated unless pt declined. Discussed healthy diet and lifestyle.  

## 2021-03-20 NOTE — Assessment & Plan Note (Signed)

## 2021-03-20 NOTE — Assessment & Plan Note (Signed)
Congratulated on healthy diet and lifestyle changes to date - A1c down to 6.1%. noticing some trouble with metformin - will trial XR formulation.

## 2021-03-20 NOTE — Progress Notes (Addendum)
Patient ID: Jennifer Pineda, female    DOB: 05-02-1951, 70 y.o.   MRN: [id number]  This visit was conducted in person.  BP 134/60   Pulse 65   Temp 97.8 F (36.6 C) (Temporal)   Ht 5' 3.5" (1.613 m)   Wt 177 lb 1 oz (80.3 kg)   SpO2 96%   BMI 30.87 kg/m    CC: AMW/CPE Subjective:   HPI: Jennifer Pineda is a 70 y.o. female presenting on 03/20/2021 for Medicare Wellness   Did not see health advisor this year.   Hearing Screening   '500Hz'  '1000Hz'  '2000Hz'  '4000Hz'   Right ear 40 40 25 0  Left ear 20 40 25 40  Vision Screening - Comments:: Last eye exam, within last 6 mos.   [place] Office Visit from 03/20/2021 in [place] at [place]  PHQ-2 Total Score 0       Fall Risk  03/20/2021 03/13/2020 02/18/2019 11/11/2017 09/30/2016  Falls in the past year? 1 0 0 No No  Comment - - - - -  Number falls in past yr: 0 0 - - -  Injury with Fall? 0 0 - - -  Risk for fall due to : - Medication side effect - - -  Follow up - Falls evaluation completed;Falls prevention discussed - - -      H/o bladder cancer s/p TURBT 07/2015. Sees urology yearly Junious Silk). Surveillance cystoscopy yearly. Monitoring new spot to left bladder.   OSA on CPAP - trouble with loose straps, previously followed by Dr Alva Garnet. Chronic cough managed with breo inhaler.   DM - metformin started 08/2020 - notes some orthostatic dizziness since starting this as well as some hand tremors. Also notes some loose stools with metformin.   Weight loss noted - eating healthier options, more fruits, walking dogs more frequently.    Preventative: COLONOSCOPY Date: 09/2012 small int hem Carlean Purl) rpt 30 yrs Well woman - last pap was 2010, all normal. Hysterectomy age 1yo. Ovaries remain.  Mammogram - 01/2020 [facility] @ [facility] - will call to schedule DEXA 06/2014 - WNL Lung cancer screening - not eligible Flu - yearly COVID vaccine - Moderna 10/2019, 11/2019, no booster Prevnar-13 2015, pneumovax23 08/2015 Td  2005, Tdap 2015 zostavax - 2015  shingrix - discussed - to check with pharmacy  Advanced directives - in chart 08/2015. Would want son [name]) to be HCPOA. No prolonged life support if terminal condition. Full code, ok with temporary ventilation. Seat belt use discussed Sunscreen use discussed. No changing moles on skin. Sees derm s/p BCC removed 2021  Ex smoker - quit 1988 Alcohol - 1-2 glasses per week  Eye exam yearly - macular degeneration  Dentist q6 mo  Bowel - no constipation  Bladder - some uirge incontinence, nocturia x3-4    Daughter [name] lives with her Children: 2 children // one daughter lives with mother/ son lives with father Activity: 20 min walk daily Diet: good water, fruits/vegetables daily.      Relevant past medical, surgical, family and social history reviewed and updated as indicated. Interim medical history since our last visit reviewed. Allergies and medications reviewed and updated. Outpatient Medications Prior to Visit  Medication Sig Dispense Refill   albuterol (VENTOLIN HFA) 108 (90 Base) MCG/ACT inhaler Inhale 2 puffs into the lungs every 6 (six) hours as needed. For shortness of breath. 18 g 6   Blood Glucose Monitoring Suppl (ONE TOUCH ULTRA SYSTEM KIT) w/Device KIT 1  kit by Does not apply route once. 1 each 0   fluticasone (CUTIVATE) 0.05 % cream Apply topically. For lips     GENTAMICIN SULFATE EX Apply topically. For nose     glucose blood (ONE TOUCH ULTRA TEST) test strip 1 each by Other route daily. Use to check sugar once daily and as needed. Dx: E11.9 100 each 3   loratadine (CLARITIN) 10 MG tablet TAKE 1 TABLET BY MOUTH ONCE DAILY 90 tablet 3   Multiple Vitamins-Calcium (VIACTIV MULTI-VITAMIN) CHEW Chew 1 tablet by mouth daily.     Multiple Vitamins-Minerals (PRESERVISION AREDS PO) Take by mouth at bedtime.     ONETOUCH DELICA LANCETS FINE MISC Use as directed - check as needed or 3 times weekly 100 each 1   amLODipine  (NORVASC) 10 MG tablet Take 1 tablet (10 mg total) by mouth daily. 90 tablet 3   atorvastatin (LIPITOR) 10 MG tablet Take 1 tablet (10 mg total) by mouth daily. 90 tablet 3   FLUoxetine (PROZAC) 10 MG capsule Take 1 capsule (10 mg total) by mouth daily. 90 capsule 3   fluticasone (FLONASE) 50 MCG/ACT nasal spray Use 2 spray(s) in each nostril once daily 48 g 3   fluticasone furoate-vilanterol (BREO ELLIPTA) 100-25 MCG/INH AEPB Inhale 1 puff into the lungs daily. 90 each 3   lisinopril (ZESTRIL) 10 MG tablet Take 1 tablet (10 mg total) by mouth daily. 90 tablet 1   metFORMIN (GLUCOPHAGE) 500 MG tablet Take 1 tablet (500 mg total) by mouth daily with breakfast. 90 tablet 3   omeprazole (PRILOSEC) 40 MG capsule Take 1 capsule (40 mg total) by mouth daily. 90 capsule 3   triamterene-hydrochlorothiazide (MAXZIDE-25) 37.5-25 MG tablet Take 1 tablet by mouth daily. 90 tablet 3   No facility-administered medications prior to visit.     Per HPI unless specifically indicated in ROS section below Review of Systems  Constitutional:  Negative for activity change, appetite change, chills, fatigue, fever and unexpected weight change.  HENT:  Negative for hearing loss.   Eyes:  Negative for visual disturbance.  Respiratory:  Negative for cough, chest tightness, shortness of breath and wheezing.   Cardiovascular:  Negative for chest pain, palpitations and leg swelling.  Gastrointestinal:  Negative for abdominal distention, abdominal pain, blood in stool, constipation, diarrhea, nausea and vomiting.       Occasional loose stools  Genitourinary:  Negative for difficulty urinating and hematuria.  Musculoskeletal:  Negative for arthralgias, myalgias and neck pain.  Skin:  Negative for rash.  Neurological:  Negative for seizures, syncope and headaches. Dizziness: occasional. Hematological:  Negative for adenopathy. Does not bruise/bleed easily.  Psychiatric/Behavioral:  Negative for dysphoric mood. The patient  is not nervous/anxious.    Objective:  BP 134/60   Pulse 65   Temp 97.8 F (36.6 C) (Temporal)   Ht 5' 3.5" (1.613 m)   Wt 177 lb 1 oz (80.3 kg)   SpO2 96%   BMI 30.87 kg/m   Wt Readings from Last 3 Encounters:  03/20/21 177 lb 1 oz (80.3 kg)  09/18/20 191 lb 8 oz (86.9 kg)  03/15/20 188 lb 8 oz (85.5 kg)      Physical Exam Vitals and nursing note reviewed.  Constitutional:      Appearance: Normal appearance. She is not ill-appearing.  HENT:     Head: Normocephalic and atraumatic.     Right Ear: Tympanic membrane, ear canal and external ear normal. There is no impacted cerumen.  Left Ear: Tympanic membrane, ear canal and external ear normal. There is no impacted cerumen.  Eyes:     General:        Right eye: No discharge.        Left eye: No discharge.     Extraocular Movements: Extraocular movements intact.     Conjunctiva/sclera: Conjunctivae normal.     Pupils: Pupils are equal, round, and reactive to light.  Neck:     Thyroid: No thyroid mass or thyromegaly.     Vascular: No carotid bruit.  Cardiovascular:     Rate and Rhythm: Normal rate and regular rhythm.     Pulses: Normal pulses.     Heart sounds: Murmur (2/6 systolic USB) heard.  Pulmonary:     Effort: Pulmonary effort is normal. No respiratory distress.     Breath sounds: Normal breath sounds. No wheezing, rhonchi or rales.  Abdominal:     General: Bowel sounds are normal. There is no distension.     Palpations: Abdomen is soft. There is no mass.     Tenderness: There is no abdominal tenderness. There is no guarding or rebound.     Hernia: No hernia is present.  Musculoskeletal:     Cervical back: Normal range of motion and neck supple. No rigidity.     Right lower leg: No edema.     Left lower leg: No edema.  Lymphadenopathy:     Cervical: No cervical adenopathy.  Skin:    General: Skin is warm and dry.     Findings: No rash.  Neurological:     General: No focal deficit present.     Mental  Status: She is alert. Mental status is at baseline.     Comments:  Recall 3/3 Calculation 5/5 DLROW  Psychiatric:        Mood and Affect: Mood normal.        Behavior: Behavior normal.      Results for orders placed or performed in visit on 03/13/21  CBC with Differential/Platelet  Result Value Ref Range   WBC 10.3 4.0 - 10.5 K/uL   RBC 4.25 3.87 - 5.11 Mil/uL   Hemoglobin 12.8 12.0 - 15.0 g/dL   HCT 37.7 36.0 - 46.0 %   MCV 88.7 78.0 - 100.0 fl   MCHC 34.0 30.0 - 36.0 g/dL   RDW 14.4 11.5 - 15.5 %   Platelets 273.0 150.0 - 400.0 K/uL   Neutrophils Relative % 64.7 43.0 - 77.0 %   Lymphocytes Relative 23.3 12.0 - 46.0 %   Monocytes Relative 6.8 3.0 - 12.0 %   Eosinophils Relative 4.3 0.0 - 5.0 %   Basophils Relative 0.9 0.0 - 3.0 %   Neutro Abs 6.7 1.4 - 7.7 K/uL   Lymphs Abs 2.4 0.7 - 4.0 K/uL   Monocytes Absolute 0.7 0.1 - 1.0 K/uL   Eosinophils Absolute 0.4 0.0 - 0.7 K/uL   Basophils Absolute 0.1 0.0 - 0.1 K/uL  Hemoglobin A1c  Result Value Ref Range   Hgb A1c MFr Bld 6.1 4.6 - 6.5 %  Comprehensive metabolic panel  Result Value Ref Range   Sodium 139 135 - 145 mEq/L   Potassium 4.5 3.5 - 5.1 mEq/L   Chloride 101 96 - 112 mEq/L   CO2 31 19 - 32 mEq/L   Glucose, Bld 108 (H) 70 - 99 mg/dL   BUN 17 6 - 23 mg/dL   Creatinine, Ser 0.76 0.40 - 1.20 mg/dL   Total Bilirubin 0.4  0.2 - 1.2 mg/dL   Alkaline Phosphatase 68 39 - 117 U/L   AST 12 0 - 37 U/L   ALT 13 0 - 35 U/L   Total Protein 7.3 6.0 - 8.3 g/dL   Albumin 4.2 3.5 - 5.2 g/dL   GFR 78.57 >60.00 mL/min   Calcium 9.3 8.4 - 10.5 mg/dL  Lipid panel  Result Value Ref Range   Cholesterol 163 0 - 200 mg/dL   Triglycerides 187.0 (H) 0.0 - 149.0 mg/dL   HDL 48.40 >39.00 mg/dL   VLDL 37.4 0.0 - 40.0 mg/dL   LDL Cholesterol 77 0 - 99 mg/dL   Total CHOL/HDL Ratio 3    NonHDL 114.58    Depression screen [facility] 2/9 03/20/2021 03/13/2020 02/18/2019 11/11/2017 09/30/2016  Decreased Interest 0 0 0 0 0  Down, Depressed, Hopeless 0  0 0 0 0  PHQ - 2 Score 0 0 0 0 0  Altered sleeping 1 0 0 0 -  Tired, decreased energy 1 0 0 0 -  Change in appetite 0 0 0 0 -  Feeling bad or failure about yourself  0 0 0 0 -  Trouble concentrating 0 0 0 0 -  Moving slowly or fidgety/restless 0 0 0 0 -  Suicidal thoughts 0 0 0 0 -  PHQ-9 Score 2 0 0 0 -  Difficult doing work/chores - Not difficult at all Not difficult at all Not difficult at all -    GAD 7 : Generalized Anxiety Score 03/20/2021  Nervous, Anxious, on Edge 0  Control/stop worrying 0  Worry too much - different things 1  Trouble relaxing 0  Restless 0  Easily annoyed or irritable 0  Afraid - awful might happen 0  Total GAD 7 Score 1    Assessment & Plan:  This visit occurred during the SARS-CoV-2 public health emergency.  Safety protocols were in place, including screening questions prior to the visit, additional usage of staff PPE, and extensive cleaning of exam room while observing appropriate contact time as indicated for disinfecting solutions.   Problem List Items Addressed This Visit     Healthcare maintenance (Chronic)    Preventative protocols reviewed and updated unless pt declined. Discussed healthy diet and lifestyle.        Medicare annual wellness visit, subsequent (Chronic)    I have personally reviewed the Medicare Annual Wellness questionnaire and have noted 1. The patient's medical and social history 2. Their use of alcohol, tobacco or illicit drugs 3. Their current medications and supplements 4. The patient's functional ability including ADL's, fall risks, home safety risks and hearing or visual impairment. Cognitive function has been assessed and addressed as indicated.  5. Diet and physical activity 6. Evidence for depression or mood disorders The patients weight, height, BMI have been recorded in the chart. I have made referrals, counseling and provided education to the patient based on review of the above and I have provided the pt with a  written personalized care plan for preventive services. Provider list updated.. See scanned questionairre as needed for further documentation. Reviewed preventative protocols and updated unless pt declined.       Hyperlipidemia associated with type 2 diabetes mellitus (HCC)    Chronic, stable on low dose lipitor - continue. The 10-year ASCVD risk score Mikey Bussing DC Jr., et al., 2013) is: 26.6%   Values used to calculate the score:     Age: 9 years     Sex: Female  Is Non-Hispanic African American: No     Diabetic: Yes     Tobacco smoker: No     Systolic Blood Pressure: 633 mmHg     Is BP treated: Yes     HDL Cholesterol: 48.4 mg/dL     Total Cholesterol: 163 mg/dL        Relevant Medications   atorvastatin (LIPITOR) 10 MG tablet   lisinopril (ZESTRIL) 10 MG tablet   metFORMIN (GLUCOPHAGE XR) 500 MG 24 hr tablet   ANXIETY DEPRESSION    Stable period on low dose prozac. Continue        Relevant Medications   FLUoxetine (PROZAC) 10 MG capsule   Obstructive sleep apnea    Continues CPAP use - last saw pulm 2019.        Essential hypertension    Chronic, stable. Continue current regimen of lisinopril, maxzide, and amlodipine.        Relevant Medications   amLODipine (NORVASC) 10 MG tablet   atorvastatin (LIPITOR) 10 MG tablet   lisinopril (ZESTRIL) 10 MG tablet   triamterene-hydrochlorothiazide (MAXZIDE-25) 37.5-25 MG tablet   GERD    Continues daily PPI       Relevant Medications   omeprazole (PRILOSEC) 40 MG capsule   Controlled type 2 diabetes mellitus without complication, without long-term current use of insulin ([place] )    Congratulated on healthy diet and lifestyle changes to date - A1c down to 6.1%. noticing some trouble with metformin - will trial XR formulation.        Relevant Medications   atorvastatin (LIPITOR) 10 MG tablet   lisinopril (ZESTRIL) 10 MG tablet   metFORMIN (GLUCOPHAGE XR) 500 MG 24 hr tablet   Persistent asthma without complication     Chronic, stable period on breo daily.        Relevant Medications   fluticasone furoate-vilanterol (BREO ELLIPTA) 100-25 MCG/INH AEPB   COPD, mild (HCC)    Chronic, stable on current regimen of breo. Now off spiriva. Last saw pulm 2019.        Relevant Medications   fluticasone (FLONASE) 50 MCG/ACT nasal spray   fluticasone furoate-vilanterol (BREO ELLIPTA) 100-25 MCG/INH AEPB   History of bladder cancer    Regularly seeing urology for surveillance.        Obesity, Class I, BMI 30-34.9    Congratulated on weight loss through healthy diet and lifestyle choices. Pt motivated to continue healthy changes.          Meds ordered this encounter  Medications   amLODipine (NORVASC) 10 MG tablet    Sig: Take 1 tablet (10 mg total) by mouth daily.    Dispense:  90 tablet    Refill:  3   atorvastatin (LIPITOR) 10 MG tablet    Sig: Take 1 tablet (10 mg total) by mouth daily.    Dispense:  90 tablet    Refill:  3   FLUoxetine (PROZAC) 10 MG capsule    Sig: Take 1 capsule (10 mg total) by mouth daily.    Dispense:  90 capsule    Refill:  3   fluticasone (FLONASE) 50 MCG/ACT nasal spray    Sig: Use 2 spray(s) in each nostril once daily    Dispense:  48 g    Refill:  3   fluticasone furoate-vilanterol (BREO ELLIPTA) 100-25 MCG/INH AEPB    Sig: Inhale 1 puff into the lungs daily.    Dispense:  90 each    Refill:  3   lisinopril (ZESTRIL)  10 MG tablet    Sig: Take 1 tablet (10 mg total) by mouth daily.    Dispense:  90 tablet    Refill:  3   omeprazole (PRILOSEC) 40 MG capsule    Sig: Take 1 capsule (40 mg total) by mouth daily.    Dispense:  90 capsule    Refill:  3   triamterene-hydrochlorothiazide (MAXZIDE-25) 37.5-25 MG tablet    Sig: Take 1 tablet by mouth daily.    Dispense:  90 tablet    Refill:  3   metFORMIN (GLUCOPHAGE XR) 500 MG 24 hr tablet    Sig: Take 1 tablet (500 mg total) by mouth daily with breakfast.    Dispense:  90 tablet    Refill:  3   No  orders of the defined types were placed in this encounter.   Patient instructions: Call to schedule mammogram at your convenience: [facility] at [facility] [phone] If interested, check with pharmacy about new 2 shot shingles series (shingrix).  Trial metformin XR 593m daily in place of immediate release metformin. Update u[place]ith effect.  Good to see you today Return as needed or in 1 year for next physical.   Follow up plan: Return in about 1 year (around 03/20/2022) for annual exam, prior fasting for blood work, medicare wellness visit.  JRia Bush MD

## 2021-03-26 DIAGNOSIS — M25561 Pain in right knee: Secondary | ICD-10-CM | POA: Diagnosis not present

## 2021-04-02 ENCOUNTER — Telehealth: Payer: Self-pay

## 2021-04-02 NOTE — Chronic Care Management (AMB) (Addendum)
Chronic Care Management Pharmacy Assistant   Name: LASHAUNA ARPIN  MRN: [id number] DOB: Dec 20, 1950  Jennifer Pineda is an 70 y.o. year old female who presents for her initial CCM visit with the clinical pharmacist.  Reason for Encounter: Initial Questions   Conditions to be addressed/monitored: HTN, HLD, COPD, and DMII  Recent office visits:  03/20/21 - Dr.Gutierrez PCP - Patient presented for AWV. She reported trouble with metformin. Will trial XR formulation.   Recent consult visits:  01/25/21 - Urology - Patient presented for CYSTOURETHROSCOPY  01/10/21 - Urology - no data found 01/08/21 - Ophthalmology - no data found  Hospital visits:  None in previous 6 months  Medications: Outpatient Encounter Medications as of 04/02/2021  Medication Sig   albuterol (VENTOLIN HFA) 108 (90 Base) MCG/ACT inhaler Inhale 2 puffs into the lungs every 6 (six) hours as needed. For shortness of breath.   amLODipine (NORVASC) 10 MG tablet Take 1 tablet (10 mg total) by mouth daily.   atorvastatin (LIPITOR) 10 MG tablet Take 1 tablet (10 mg total) by mouth daily.   Blood Glucose Monitoring Suppl (ONE TOUCH ULTRA SYSTEM KIT) w/Device KIT 1 kit by Does not apply route once.   FLUoxetine (PROZAC) 10 MG capsule Take 1 capsule (10 mg total) by mouth daily.   fluticasone (CUTIVATE) 0.05 % cream Apply topically. For lips   fluticasone (FLONASE) 50 MCG/ACT nasal spray Use 2 spray(s) in each nostril once daily   fluticasone furoate-vilanterol (BREO ELLIPTA) 100-25 MCG/INH AEPB Inhale 1 puff into the lungs daily.   GENTAMICIN SULFATE EX Apply topically. For nose   glucose blood (ONE TOUCH ULTRA TEST) test strip 1 each by Other route daily. Use to check sugar once daily and as needed. Dx: E11.9   lisinopril (ZESTRIL) 10 MG tablet Take 1 tablet (10 mg total) by mouth daily.   loratadine (CLARITIN) 10 MG tablet TAKE 1 TABLET BY MOUTH ONCE DAILY   metFORMIN (GLUCOPHAGE XR) 500 MG 24 hr tablet Take 1 tablet (500  mg total) by mouth daily with breakfast.   Multiple Vitamins-Calcium (VIACTIV MULTI-VITAMIN) CHEW Chew 1 tablet by mouth daily.   Multiple Vitamins-Minerals (PRESERVISION AREDS PO) Take by mouth at bedtime.   omeprazole (PRILOSEC) 40 MG capsule Take 1 capsule (40 mg total) by mouth daily.   ONETOUCH DELICA LANCETS FINE MISC Use as directed - check as needed or 3 times weekly   triamterene-hydrochlorothiazide (MAXZIDE-25) 37.5-25 MG tablet Take 1 tablet by mouth daily.   No facility-administered encounter medications on file as of 04/02/2021.   Lab Results  Component Value Date/Time   HGBA1C 6.1 03/13/2021 08:03 AM   HGBA1C 7.1 (A) 09/18/2020 08:12 AM   HGBA1C 6.9 (H) 03/08/2020 08:03 AM   MICROALBUR 2.9 (H) 02/18/2019 12:51 PM   MICROALBUR 1.5 11/11/2017 01:21 PM    BP Readings from Last 3 Encounters:  03/20/21 134/60  09/18/20 (!) 148/60  08/07/20 (!) 152/66     Bobbye Morton did not answer the telephone. Left voicemail to remind her to have all medications, supplements and any blood glucose and blood pressure readings available for review with Debbora Dus, Pharm. D, at her telephone visit on 04/09/2021 at 11:00am.    Star Rating Drugs:  Medication:  Last Fill: Day Supply Metformin 561m 03/12/21 90 Atorvastatin 157m5/16/22 90 Lisinopril 1019m/26/22 90   Care Gaps: Last annual wellness visit: 03/20/21 If Diabetic:  Last eye exam / retinopathy screening: 01/08/21 Last diabetic foot exam: 03/20/21  Debbora Dus, CPP notified  Avel Sensor, [place] Assistant [phone]  I have reviewed the care management and care coordination activities outlined in this encounter and I am certifying that I agree with the content of this note. No further action required.  Debbora Dus, PharmD Clinical Pharmacist [facility] at [facility] [phone]

## 2021-04-03 ENCOUNTER — Other Ambulatory Visit: Payer: Self-pay | Admitting: Family Medicine

## 2021-04-04 ENCOUNTER — Other Ambulatory Visit: Payer: Self-pay | Admitting: Family Medicine

## 2021-04-09 ENCOUNTER — Ambulatory Visit (INDEPENDENT_AMBULATORY_CARE_PROVIDER_SITE_OTHER): Payer: PPO

## 2021-04-09 ENCOUNTER — Other Ambulatory Visit: Payer: Self-pay

## 2021-04-09 DIAGNOSIS — E785 Hyperlipidemia, unspecified: Secondary | ICD-10-CM

## 2021-04-09 DIAGNOSIS — I10 Essential (primary) hypertension: Secondary | ICD-10-CM

## 2021-04-09 DIAGNOSIS — E119 Type 2 diabetes mellitus without complications: Secondary | ICD-10-CM | POA: Diagnosis not present

## 2021-04-09 DIAGNOSIS — J449 Chronic obstructive pulmonary disease, unspecified: Secondary | ICD-10-CM

## 2021-04-09 DIAGNOSIS — E1169 Type 2 diabetes mellitus with other specified complication: Secondary | ICD-10-CM

## 2021-04-09 NOTE — Progress Notes (Signed)
Chronic Care Management Pharmacy Note  04/09/2021 Name:  Jennifer Pineda MRN:  [id number] DOB:  07/24/1951  Subjective: Jennifer Pineda is an 70 y.o. year old female who is a primary patient of Ria Bush, MD.  The CCM team was consulted for assistance with disease management and care coordination needs.    Engaged with patient by telephone for initial visit in response to provider referral for pharmacy case management and/or care coordination services. She is doing better on metformin ER 500 mg compared to IR, changed at last PCP visit, no more shakes/dizziness.  Consent to Services:  The patient was given the following information about Chronic Care Management services today, agreed to services, and gave verbal consent: 1. CCM service includes personalized support from designated clinical staff supervised by the primary care provider, including individualized plan of care and coordination with other care providers 2. 24/7 contact phone numbers for assistance for urgent and routine care needs. 3. Service will only be billed when office clinical staff spend 20 minutes or more in a month to coordinate care. 4. Only one practitioner may furnish and bill the service in a calendar month. 5.The patient may stop CCM services at any time (effective at the end of the month) by phone call to the office staff. 6. The patient will be responsible for cost sharing (co-pay) of up to 20% of the service fee (after annual deductible is met). Patient agreed to services and consent obtained.  Patient Care Team: Ria Bush, MD as PCP - General (Family Medicine) Arelia Sneddon, [place] as Consulting Physician (Optometry) Festus Aloe, MD as Consulting Physician (Urology) Beverly Gust, MD as Consulting Physician (Otolaryngology) Vilinda Boehringer, MD (Inactive) as Consulting Physician (Internal Medicine) Adolm Joseph as Physician Assistant (Physician Assistant) Debbora Dus, [facility] as  Pharmacist (Pharmacist)  Recent office visits: 03/20/21 - Dr. Danise Mina, PCP - Patient presented for AWV. She had orthostasis and tremor with metformin IR. Will trial XR formulation.    Recent consult visits:  01/25/21 - Urology - Patient presented for CYSTOURETHROSCOPY  01/10/21 - Urology - no data found 01/08/21 - Ophthalmology - no data found    Hospital visits: None in previous 6 months   Objective:  Lab Results  Component Value Date   CREATININE 0.76 03/13/2021   BUN 17 03/13/2021   GFR 78.57 03/13/2021   GFRNONAA 77.61 11/14/2009   NA 139 03/13/2021   K 4.5 03/13/2021   CALCIUM 9.3 03/13/2021   CO2 31 03/13/2021   GLUCOSE 108 (H) 03/13/2021   Lab Results  Component Value Date/Time   HGBA1C 6.1 03/13/2021 08:03 AM   HGBA1C 7.1 (A) 09/18/2020 08:12 AM   HGBA1C 6.9 (H) 03/08/2020 08:03 AM   GFR 78.57 03/13/2021 08:03 AM   GFR 77.39 03/08/2020 08:03 AM   MICROALBUR 2.9 (H) 02/18/2019 12:51 PM   MICROALBUR 1.5 11/11/2017 01:21 PM    Last diabetic Eye exam:  Lab Results  Component Value Date/Time   HMDIABEYEEXA No Retinopathy 01/08/2021 12:00 AM    Last diabetic Foot exam: 09/18/20 normal (PCP)  Lab Results  Component Value Date   CHOL 163 03/13/2021   HDL 48.40 03/13/2021   LDLCALC 77 03/13/2021   TRIG 187.0 (H) 03/13/2021   CHOLHDL 3 03/13/2021    Hepatic Function Latest Ref Rng & Units 03/13/2021 03/08/2020 02/18/2019  Total Protein 6.0 - 8.3 g/dL 7.3 7.5 7.7  Albumin 3.5 - 5.2 g/dL 4.2 4.1 4.2  AST 0 - 37 U/L 12 17 24  ALT 0 - 35 U/L _0 Alk Phosphatase 39 - 117 U/L 68 82 88  Total Bilirubin 0.2 - 1.2 mg/dL 0.4 0.5 0.6  Bilirubin, Direct 0.0 - 0.3 mg/dL - - -    Lab Results  Component Value Date/Time   TSH 1.99 02/18/2019 12:51 PM   TSH 1.76 09/01/2015 08:45 AM    CBC Latest Ref Rng & Units 03/13/2021 11/04/2016 09/30/2016  WBC 4.0 - 10.5 K/uL 10.3 10.7(H) 12.5(H)  Hemoglobin 12.0 - 15.0 g/dL 12.8 13.8 14.4  Hematocrit 36.0 - 46.0 % 37.7 41.1  42.9  Platelets 150.0 - 400.0 K/uL 273.0 294.0 337.0    No results found for: VD25OH  Clinical ASCVD: No  The 10-year ASCVD risk score Mikey Bussing DC Jr., et al., 2013) is: 26.6%   Values used to calculate the score:     Age: 33 years     Sex: Female     Is Non-Hispanic African American: No     Diabetic: Yes     Tobacco smoker: No     Systolic Blood Pressure: 659 mmHg     Is BP treated: Yes     HDL Cholesterol: 48.4 mg/dL     Total Cholesterol: 163 mg/dL    Depression screen [facility] 2/9 03/20/2021 03/13/2020 02/18/2019  Decreased Interest 0 0 0  Down, Depressed, Hopeless 0 0 0  PHQ - 2 Score 0 0 0  Altered sleeping 1 0 0  Tired, decreased energy 1 0 0  Change in appetite 0 0 0  Feeling bad or failure about yourself  0 0 0  Trouble concentrating 0 0 0  Moving slowly or fidgety/restless 0 0 0  Suicidal thoughts 0 0 0  PHQ-9 Score 2 0 0  Difficult doing work/chores - Not difficult at all Not difficult at all    Social History   Tobacco Use  Smoking Status Former   Packs/day: 2.00   Years: 19.00   Pack years: 38.00   Types: Cigarettes   Quit date: 08/26/1986   Years since quitting: 34.6  Smokeless Tobacco Never   BP Readings from Last 3 Encounters:  03/20/21 134/60  09/18/20 (!) 148/60  08/07/20 (!) 152/66   Pulse Readings from Last 3 Encounters:  03/20/21 65  09/18/20 70  08/07/20 (!) 58   Wt Readings from Last 3 Encounters:  03/20/21 177 lb 1 oz (80.3 kg)  09/18/20 191 lb 8 oz (86.9 kg)  03/15/20 188 lb 8 oz (85.5 kg)   BMI Readings from Last 3 Encounters:  03/20/21 30.87 kg/m  09/18/20 33.39 kg/m  03/15/20 32.87 kg/m    Assessment/Interventions: Review of patient past medical history, allergies, medications, health status, including review of consultants reports, laboratory and other test data, was performed as part of comprehensive evaluation and provision of chronic care management services.   SDOH:  (Social Determinants of Health) assessments and  interventions performed: Yes SDOH Interventions    Flowsheet Row Most Recent Value  SDOH Interventions   Financial Strain Interventions Intervention Not Indicated      SDOH Screenings   Alcohol Screen: Not on file  Depression (PHQ2-9): Low Risk    PHQ-2 Score: 2  Financial Resource Strain: Low Risk    Difficulty of Paying Living Expenses: Not very hard  Food Insecurity: Not on file  Housing: Not on file  Physical Activity: Not on file  Social Connections: Not on file  Stress: Not on file  Tobacco Use: Medium Risk   Smoking Tobacco Use:  Former   Smokeless Tobacco Use: Never  Transportation Needs: Not on file    [place]  Allergies  Allergen Reactions   Levofloxacin Shortness Of Breath and Swelling   Doxycycline Hyclate Diarrhea and Other (See Comments)     stomach upset   Metoprolol Succinate Diarrhea and Other (See Comments)    stomach upset   Other Swelling    Horseradish   Tetracycline Hcl Diarrhea and Other (See Comments)     stomach upset   Adhesive [Tape] Rash    Medications Reviewed Today     Reviewed by Ria Bush, MD (Physician) on 03/20/21 at [place] List Status: <None>   Medication Order Taking? Sig Documenting Provider Last Dose Status Informant  albuterol (VENTOLIN HFA) 108 (90 Base) MCG/ACT inhaler [id number] Yes Inhale 2 puffs into the lungs every 6 (six) hours as needed. For shortness of breath. Ria Bush, MD Taking Active   amLODipine (NORVASC) 10 MG tablet [id number] Yes Take 1 tablet (10 mg total) by mouth daily. Ria Bush, MD Taking Active   atorvastatin (LIPITOR) 10 MG tablet [id number] Yes Take 1 tablet (10 mg total) by mouth daily. Ria Bush, MD Taking Active   Blood Glucose Monitoring Suppl (ONE TOUCH ULTRA SYSTEM KIT) w/Device KIT [id number] Yes 1 kit by Does not apply route once. Ria Bush, MD Taking Active   FLUoxetine (PROZAC) 10 MG capsule [id number] Yes Take 1 capsule (10 mg total) by mouth  daily. Ria Bush, MD Taking Active   fluticasone (CUTIVATE) 0.05 % cream [id number] Yes Apply topically. For lips [provider] Taking Active Self  fluticasone (FLONASE) 50 MCG/ACT nasal spray [id number] Yes Use 2 spray(s) in each nostril once daily Ria Bush, MD Taking Active   fluticasone furoate-vilanterol (BREO ELLIPTA) 100-25 MCG/INH AEPB [id number] Yes Inhale 1 puff into the lungs daily. Ria Bush, MD Taking Active   GENTAMICIN [place] [id number] Yes Apply topically. For nose [provider] Taking Active Self  glucose blood (ONE TOUCH ULTRA TEST) test strip [id number] Yes 1 each by Other route daily. Use to check sugar once daily and as needed. Dx: E11.9 Ria Bush, MD Taking Active   lisinopril (ZESTRIL) 10 MG tablet [id number] Yes Take 1 tablet (10 mg total) by mouth daily. Ria Bush, MD Taking Active   loratadine (CLARITIN) 10 MG tablet [id number] Yes TAKE 1 TABLET BY MOUTH ONCE DAILY Ria Bush, MD Taking Active   metFORMIN (GLUCOPHAGE) 500 MG tablet [id number] Yes Take 1 tablet (500 mg total) by mouth daily with breakfast. Ria Bush, MD Taking Active   Multiple Vitamins-Calcium (VIACTIV MULTI-VITAMIN) CHEW [id number] Yes Chew 1 tablet by mouth daily. [provider] Taking Active   Multiple Vitamins-Minerals (PRESERVISION AREDS PO) [id number] Yes Take by mouth at bedtime. [provider] Taking Active Self  omeprazole (PRILOSEC) 40 MG capsule [id number] Yes Take 1 capsule (40 mg total) by mouth daily. Ria Bush, MD Taking Active   [facility] LANCETS FINE [place] [id number] Yes Use as directed - check as needed or 3 times weekly Ria Bush, MD Taking Active   triamterene-hydrochlorothiazide [facility]) 37.5-25 MG tablet [id number] Yes Take 1 tablet by mouth daily. Ria Bush, MD Taking Active             Patient Active Problem List   Diagnosis Date Noted   Medicare annual  wellness visit, subsequent 03/20/2021   Obesity, Class I, BMI 30-34.9 03/20/2021   Neck pain on left side 03/15/2020   COVID-19 virus infection 09/24/2019  Lip lesion 04/24/2017   Advanced care planning/counseling discussion 09/08/2015   History of bladder cancer    COPD, mild ([place]) 11/09/2014   Left trigger finger 05/11/2014   Healthcare maintenance 09/24/2012   Abnormal MRI, spine 04/23/2012   Persistent asthma without complication 03/47/4259   Arthritis 05/01/2011   Controlled type 2 diabetes mellitus without complication, without long-term current use of insulin ([place]) 05/01/2011   Obstructive sleep apnea 03/01/2010   Allergic rhinitis 03/01/2010   Hyperlipidemia associated with type 2 diabetes mellitus ([place]) 03/15/2008   ANXIETY DEPRESSION 03/15/2008   Essential hypertension 03/15/2008   GERD 03/15/2008   Herpes zoster 12/29/2006    Immunization History  Administered Date(s) Administered   Fluad Quad(high Dose 65+) 09/18/2020   Influenza, High Dose Seasonal PF 07/07/2017   Influenza,inj,Quad PF,6+ Mos 09/08/2015, 10/07/2016, 05/20/2018   Moderna Sars-Covid-2 Vaccination 10/31/2019, 12/01/2019   Pneumococcal Conjugate-13 05/11/2014   Pneumococcal Polysaccharide-23 09/08/2015   Td 02/11/2004   Tdap 05/11/2014   Zoster Recombinat (Shingrix) 04/04/2021   Zoster, Live 06/08/2014    Conditions to be addressed/monitored:  Hypertension, Hyperlipidemia, Diabetes, COPD, and Asthma  Care Plan : [place]  Updates made by Debbora Dus, [place] since 04/09/2021 12:00 AM     Problem: CHL AMB "PATIENT-SPECIFIC PROBLEM"      Long-Range Goal: Disease Management   Start Date: 04/09/2021  Priority: High  Note:    Current Barriers:  None identified  Pharmacist Clinical Goal(s):  Patient will contact provider office for questions/concerns as evidenced notation of same in electronic health record through collaboration with PharmD and provider.   Interventions: 1:1  collaboration with Ria Bush, MD regarding development and update of comprehensive plan of care as evidenced by provider attestation and co-signature Inter-disciplinary care team collaboration (see longitudinal plan of care) Comprehensive medication review performed; medication list updated in electronic medical record  Hypertension (BP goal <140/90) -Controlled - per home BP and last clinic BP -Current treatment: Amlodipine 10 mg - 1 tablet daily Lisinopril 10 mg - 1 tablet daily (increased dose 08/2020) Triamterene/HCTZ 37.5 mg- 25 mg - 1 tablet daily -Medications previously tried: none   -Current home readings: she checks when feeling "hot", reports it may be a little elevated 140-150. She lies down and this improves. Usually 120-130/70-80 at home. -Denies hypotensive/hypertensive symptoms -Educated on BP goals and benefits of medications for prevention of heart attack, stroke and kidney damage; -Counseled to monitor BP at home monthly, document, and provide log at future appointments -Recommended to continue current medication  Hyperlipidemia: (LDL goal < 100) -Controlled - LDL 77 07/22 -Current treatment: Atorvastatin 10 mg - 1 tablet daily at night -Medications previously tried: none  -Educated on Cholesterol goals;  -Recommended to continue current medication  Diabetes (A1c goal <7%) -Controlled - A1c 6.1% 07/22 -Current medications: Metformin 500 mg ER - 1 tablet daily -Medications previously tried: metformin IR (hand tremors, orthostatic symptoms)  -Current home glucose readings - checking fasting a couple times a week fasting glucose: 114 (a couple days ago) - usually runs < 120 post prandial glucose: occasional, < 120 -Denies hypoglycemic/hyperglycemic symptoms -Current meal patterns: She continues to work towards goal of weight loss (15 lbs since Feb 2022), not eating after 8 PM. Less sugar than before. Goal weight loss of 2-3 lbs/month. -Current exercise: Walks  dog 1-2 miles/day, 30 minutes -Educated on A1c and blood sugar goals; -Counseled to check feet daily and get yearly eye exams -Eye exam 12/2020, no retinopathy. She reports feet look great.  PCP completed foot exam 01/22. Discussed healthy foot care. She has history of plantar fascitis. -Recommended to continue current medication  Asthma/Mild COPD (Goal: control symptoms and prevent exacerbations) -Controlled - stable, prior saw pulmonology but follow up PRN due to stability -Current treatment  Breo Ellipta 100-25 mcg/inh - 1 inhalation daily Albuterol - 2 puffs every 4-6 hours PRN OTC Loratadine 10 mg - 1 tablet daily Flonase - 1 spray in each nostril daily -Medications previously tried: none reported  -Exacerbations requiring treatment in last 6 months: denies -Patient reports consistent use of maintenance inhaler -Frequency of rescue inhaler use: none in past year -Counseled on Differences between maintenance and rescue inhalers -Recommended to continue current medication  Depression/Anxiety (Goal: Improve mood) -Controlled - She has been on Prozac for 10+ years, works well. Dose lowered 2015. -Current treatment: Fluoxetine 10 mg - 1 capsule daily -Medications previously tried/failed: denies -PHQ9: 2 (02/2021) -GAD7: n/a -Educated on Benefits of medication for symptom control -Recommended to continue current medication  GERD (Goal: Symptom control) -Controlled - per patient report -Current treatment  Omeprazole 40 mg - take 1 capsule daily before breakfast -Medications previously tried: none reported  -Recommended to continue current medication  OTCs: -Viactiv Multivitamin (Calcium and bone density) -Citrucel fiber supplement daily -Mucinex daily -Preservision for macular degeneration daily -She uses Aleve PRN, rare use (Tylenol ineffective) -No aspirin  Patient Goals/Self-Care Activities Patient will:  - take medications as prescribed  Follow Up Plan: Telephone  follow up appointment with care management team member scheduled for:  -12 months with [place] call for BG and BP logs in 6 months     Medication Assistance: None required.  Patient affirms current coverage meets needs.  Compliance/Adherence/Medication fill history: Care Gaps: Mammogram - She has phone # and will call. She has been busy. Shingrix - She will get second dose in 2 months from pharmacy.  Star Rating Drugs:  Medication:                Last Fill:         Day Supply Metformin 576m        03/12/21            90 Atorvastatin 153m      01/08/21            90 Lisinopril 1022m          03/20/21            90  [facility] [place] [place]: [phone]: [phone] pill box? Yes - fills one weekly Pt endorses 100% compliance -They have a good reminder symptom. Refills are easy.  We discussed: Benefits of medication synchronization, packaging and delivery as well as enhanced pharmacist oversight with [employer]. Patient decided to: Continue current medication management strategy  Care Plan and Follow Up Patient Decision:  Patient agrees to Care Plan and Follow-up.  MicDebbora DusharmD Clinical Pharmacist [phone]

## 2021-04-09 NOTE — Patient Instructions (Signed)
April 09, 2021  Dear Jennifer Pineda,  It was a pleasure meeting you during our initial appointment on April 09, 2021. Below is a summary of the goals we discussed and components of chronic care management. Please contact me anytime with questions or concerns.   Visit Information  Patient Care Plan: CCM Pharmacy Care Plan     Problem Identified: CHL AMB "PATIENT-SPECIFIC PROBLEM"      Long-Range Goal: Disease Management   Start Date: 04/09/2021  Priority: High  Note:    Current Barriers:  None identified  Pharmacist Clinical Goal(s):  Patient will contact provider office for questions/concerns as evidenced notation of same in electronic health record through collaboration with PharmD and provider.   Interventions: 1:1 collaboration with Ria Bush, MD regarding development and update of comprehensive plan of care as evidenced by provider attestation and co-signature Inter-disciplinary care team collaboration (see longitudinal plan of care) Comprehensive medication review performed; medication list updated in electronic medical record  Hypertension (BP goal <140/90) -Controlled - per home BP and last clinic BP -Current treatment: Amlodipine 10 mg - 1 tablet daily Lisinopril 10 mg - 1 tablet daily (increased dose 08/2020) Triamterene/HCTZ 37.5 mg- 25 mg - 1 tablet daily -Medications previously tried: none   -Current home readings: she checks when feeling "hot", reports it may be a little elevated 140-150. She lies down and this improves. Usually 120-130/70-80 at home. -Denies hypotensive/hypertensive symptoms -Educated on BP goals and benefits of medications for prevention of heart attack, stroke and kidney damage; -Counseled to monitor BP at home monthly, document, and provide log at future appointments -Recommended to continue current medication  Hyperlipidemia: (LDL goal < 100) -Controlled - LDL 77 07/22 -Current treatment: Atorvastatin 10 mg - 1 tablet daily at  night -Medications previously tried: none  -Educated on Cholesterol goals;  -Recommended to continue current medication  Diabetes (A1c goal <7%) -Controlled - A1c 6.1% 07/22 -Current medications: Metformin 500 mg ER - 1 tablet daily -Medications previously tried: metformin IR (hand tremors, orthostatic symptoms)  -Current home glucose readings - checking fasting a couple times a week fasting glucose: 114 (a couple days ago) - usually runs < 120 post prandial glucose: occasional, < 120 -Denies hypoglycemic/hyperglycemic symptoms -Current meal patterns: She continues to work towards goal of weight loss (15 lbs since Feb 2022), not eating after 8 PM. Less sugar than before. Goal weight loss of 2-3 lbs/month. -Current exercise: Walks dog 1-2 miles/day, 30 minutes -Educated on A1c and blood sugar goals; -Counseled to check feet daily and get yearly eye exams -Eye exam 12/2020, no retinopathy. She reports feet look great. PCP completed foot exam 01/22. Discussed healthy foot care. She has history of plantar fascitis. -Recommended to continue current medication  Asthma/Mild COPD (Goal: control symptoms and prevent exacerbations) -Controlled - stable, prior saw pulmonology but follow up PRN due to stability -Current treatment  Breo Ellipta 100-25 mcg/inh - 1 inhalation daily Albuterol - 2 puffs every 4-6 hours PRN OTC Loratadine 10 mg - 1 tablet daily Flonase - 1 spray in each nostril daily -Medications previously tried: none reported  -Exacerbations requiring treatment in last 6 months: denies -Patient reports consistent use of maintenance inhaler -Frequency of rescue inhaler use: none in past year -Counseled on Differences between maintenance and rescue inhalers -Recommended to continue current medication  Depression/Anxiety (Goal: Improve mood) -Controlled - She has been on Prozac for 10+ years, works well. Dose lowered 2015. -Current treatment: Fluoxetine 10 mg - 1 capsule  daily -Medications previously  tried/failed: denies -PHQ9: 2 (02/2021) -GAD7: n/a -Educated on Benefits of medication for symptom control -Recommended to continue current medication  GERD (Goal: Symptom control) -Controlled - per patient report -Current treatment  Omeprazole 40 mg - take 1 capsule daily before breakfast -Medications previously tried: none reported  -Recommended to continue current medication  OTCs: -Viactiv Multivitamin (Calcium and bone density) -Citrucel fiber supplement daily -Mucinex daily -Preservision for macular degeneration daily -She uses Aleve PRN, rare use (Tylenol ineffective) -No aspirin  Patient Goals/Self-Care Activities Patient will:  - take medications as prescribed  Follow Up Plan: Telephone follow up appointment with care management team member scheduled for:  -12 months with [place] call for BG and BP logs in 6 months     [name] was given information about Chronic Care Management services today including:  CCM service includes personalized support from designated clinical staff supervised by her physician, including individualized plan of care and coordination with other care providers 24/7 contact phone numbers for assistance for urgent and routine care needs. Standard insurance, coinsurance, copays and deductibles apply for chronic care management only during months in which we provide at least 20 minutes of these services. Most insurances cover these services at 100%, however patients may be responsible for any copay, coinsurance and/or deductible if applicable. This service may help you avoid the need for more expensive face-to-face services. Only one practitioner may furnish and bill the service in a calendar month. The patient may stop CCM services at any time (effective at the end of the month) by phone call to the office staff.  Patient agreed to services and verbal consent obtained.   Patient verbalizes understanding of  instructions provided today and agrees to view in [place].   Debbora Dus, PharmD Clinical Pharmacist [facility] at [facility] [phone]

## 2021-05-02 ENCOUNTER — Other Ambulatory Visit: Payer: Self-pay | Admitting: Family Medicine

## 2021-05-03 DIAGNOSIS — Z8551 Personal history of malignant neoplasm of bladder: Secondary | ICD-10-CM | POA: Diagnosis not present

## 2021-07-31 ENCOUNTER — Other Ambulatory Visit: Payer: Self-pay | Admitting: Family Medicine

## 2021-07-31 DIAGNOSIS — Z1231 Encounter for screening mammogram for malignant neoplasm of breast: Secondary | ICD-10-CM

## 2021-09-11 ENCOUNTER — Other Ambulatory Visit: Payer: Self-pay

## 2021-09-11 ENCOUNTER — Ambulatory Visit
Admission: RE | Admit: 2021-09-11 | Discharge: 2021-09-11 | Disposition: A | Discharge status: Home / Self Care | Payer: PPO | Source: Ambulatory Visit | Attending: Family Medicine | Admitting: Family Medicine

## 2021-09-11 DIAGNOSIS — Z1231 Encounter for screening mammogram for malignant neoplasm of breast: Secondary | ICD-10-CM | POA: Diagnosis not present

## 2021-10-15 ENCOUNTER — Telehealth (INDEPENDENT_AMBULATORY_CARE_PROVIDER_SITE_OTHER): Payer: PPO | Admitting: Nurse Practitioner

## 2021-10-15 ENCOUNTER — Encounter: Payer: Self-pay | Admitting: Nurse Practitioner

## 2021-10-15 VITALS — BP 153/68 | HR 64 | Temp 98.1°F

## 2021-10-15 DIAGNOSIS — J40 Bronchitis, not specified as acute or chronic: Secondary | ICD-10-CM

## 2021-10-15 MED ORDER — AZITHROMYCIN 250 MG PO TABS: ORAL_TABLET | ORAL | 0 refills | Status: AC

## 2021-10-15 NOTE — Assessment & Plan Note (Signed)
Patient signs symptoms consistent with acute bronchitis.  Given patient has lung disease either COPD or asthma we will go ahead and treat with azithromycin 250 mg pack.  Patient does not having shortness of breath or having use albuterol inhaler any more than normal.  Will defer steroids for now.  Did discuss signs and symptoms when to be seen urgent or emergently.  Follow-up if no improvement.  Also offered patient prescription cough medication, patient politely declined states over-the-counter medications working well

## 2021-10-15 NOTE — Progress Notes (Signed)
Patient ID: Jennifer Pineda, female    DOB: 1951-01-25, 71 y.o.   MRN: [id number]  Virtual visit completed through [employer], a video enabled telemedicine application. Due to national recommendations of social distancing due to COVID-19, a virtual visit is felt to be most appropriate for this patient at this time. Reviewed limitations, risks, security and privacy concerns of performing a virtual visit and the availability of in person appointments. I also reviewed that there may be a patient responsible charge related to this service. The patient agreed to proceed.   Patient location: home Provider location: [place] at [facility], office Persons participating in this virtual visit: patient, provider   If any vitals were documented, they were collected by patient at home unless specified below.    BP (!) 153/68    Pulse 64    Temp 98.1 F (36.7 C) Comment: per patient today   SpO2 97%    CC: Cough/URI Subjective:   HPI: Jennifer Pineda is a 71 y.o. female presenting on 10/15/2021 for Cough (X 1 1/2 weeks-had "cold like symptoms," started with lingering cough on 10/12/20-Coughing up white phlegm, sounds like a deep cough, some chest tightness, post nasal drip.  No SOB, no fever. Covid not done.)   Symptoms started approx 1.5 weeks ago  No sick contacts [place] x2 vaccines She has not covid tested. Nyquill and robitussien day and night, with some relefi      Relevant past medical, surgical, family and social history reviewed and updated as indicated. Interim medical history since our last visit reviewed. Allergies and medications reviewed and updated. Outpatient Medications Prior to Visit  Medication Sig Dispense Refill   albuterol (VENTOLIN HFA) 108 (90 Base) MCG/ACT inhaler Inhale 2 puffs into the lungs every 6 (six) hours as needed. For shortness of breath. 18 g 6   amLODipine (NORVASC) 10 MG tablet Take 1 tablet (10 mg total) by mouth daily. 90 tablet 3   atorvastatin  (LIPITOR) 10 MG tablet Take 1 tablet (10 mg total) by mouth daily. 90 tablet 3   Blood Glucose Monitoring Suppl (ONE TOUCH ULTRA SYSTEM KIT) w/Device KIT 1 kit by Does not apply route once. 1 each 0   FLUoxetine (PROZAC) 10 MG capsule Take 1 capsule (10 mg total) by mouth daily. 90 capsule 3   fluticasone (CUTIVATE) 0.05 % cream Apply topically. For lips     fluticasone (FLONASE) 50 MCG/ACT nasal spray Use 2 spray(s) in each nostril once daily 48 g 3   fluticasone furoate-vilanterol (BREO ELLIPTA) 100-25 MCG/INH AEPB Inhale 1 puff into the lungs daily. 90 each 3   GENTAMICIN SULFATE EX Apply topically. For nose     glucose blood (ONE TOUCH ULTRA TEST) test strip 1 each by Other route daily. Use to check sugar once daily and as needed. Dx: E11.9 100 each 3   lisinopril (ZESTRIL) 10 MG tablet Take 1 tablet (10 mg total) by mouth daily. 90 tablet 3   loratadine (CLARITIN) 10 MG tablet TAKE 1 TABLET BY MOUTH ONCE DAILY 90 tablet 3   metFORMIN (GLUCOPHAGE XR) 500 MG 24 hr tablet Take 1 tablet (500 mg total) by mouth daily with breakfast. 90 tablet 3   Multiple Vitamins-Calcium (VIACTIV MULTI-VITAMIN) CHEW Chew 1 tablet by mouth daily.     Multiple Vitamins-Minerals (PRESERVISION AREDS PO) Take by mouth at bedtime.     omeprazole (PRILOSEC) 40 MG capsule Take 1 capsule (40 mg total) by mouth daily. 90 capsule 3   ONETOUCH  DELICA LANCETS FINE MISC Use as directed - check as needed or 3 times weekly 100 each 1   triamterene-hydrochlorothiazide (MAXZIDE-25) 37.5-25 MG tablet Take 1 tablet by mouth daily. 90 tablet 3   No facility-administered medications prior to visit.     Per HPI unless specifically indicated in ROS section below Review of Systems  Constitutional:  Negative for appetite change, chills, fatigue and fever.  HENT:  Positive for congestion (improved) and postnasal drip. Negative for ear discharge, ear pain, sinus pressure, sinus pain and sore throat.   Respiratory:  Positive for cough  (white.). Negative for shortness of breath.   Cardiovascular:  Negative for chest pain (with coughing).  Gastrointestinal:  Positive for diarrhea (resolved). Negative for abdominal pain, nausea and vomiting.  Musculoskeletal:  Negative for arthralgias and myalgias.  Neurological:  Negative for headaches.  Objective:  BP (!) 153/68    Pulse 64    Temp 98.1 F (36.7 C) Comment: per patient today   SpO2 97%   Wt Readings from Last 3 Encounters:  03/20/21 177 lb 1 oz (80.3 kg)  09/18/20 191 lb 8 oz (86.9 kg)  03/15/20 188 lb 8 oz (85.5 kg)       Physical exam: Gen: alert, NAD, not ill appearing Pulm: speaks in complete sentences without increased work of breathing Psych: normal mood, normal thought content      Results for orders placed or performed in visit on 03/13/21  CBC with Differential/Platelet  Result Value Ref Range   WBC 10.3 4.0 - 10.5 K/uL   RBC 4.25 3.87 - 5.11 Mil/uL   Hemoglobin 12.8 12.0 - 15.0 g/dL   HCT 37.7 36.0 - 46.0 %   MCV 88.7 78.0 - 100.0 fl   MCHC 34.0 30.0 - 36.0 g/dL   RDW 14.4 11.5 - 15.5 %   Platelets 273.0 150.0 - 400.0 K/uL   Neutrophils Relative % 64.7 43.0 - 77.0 %   Lymphocytes Relative 23.3 12.0 - 46.0 %   Monocytes Relative 6.8 3.0 - 12.0 %   Eosinophils Relative 4.3 0.0 - 5.0 %   Basophils Relative 0.9 0.0 - 3.0 %   Neutro Abs 6.7 1.4 - 7.7 K/uL   Lymphs Abs 2.4 0.7 - 4.0 K/uL   Monocytes Absolute 0.7 0.1 - 1.0 K/uL   Eosinophils Absolute 0.4 0.0 - 0.7 K/uL   Basophils Absolute 0.1 0.0 - 0.1 K/uL  Hemoglobin A1c  Result Value Ref Range   Hgb A1c MFr Bld 6.1 4.6 - 6.5 %  Comprehensive metabolic panel  Result Value Ref Range   Sodium 139 135 - 145 mEq/L   Potassium 4.5 3.5 - 5.1 mEq/L   Chloride 101 96 - 112 mEq/L   CO2 31 19 - 32 mEq/L   Glucose, Bld 108 (H) 70 - 99 mg/dL   BUN 17 6 - 23 mg/dL   Creatinine, Ser 0.76 0.40 - 1.20 mg/dL   Total Bilirubin 0.4 0.2 - 1.2 mg/dL   Alkaline Phosphatase 68 39 - 117 U/L   AST 12 0 - 37 U/L    ALT 13 0 - 35 U/L   Total Protein 7.3 6.0 - 8.3 g/dL   Albumin 4.2 3.5 - 5.2 g/dL   GFR 78.57 >60.00 mL/min   Calcium 9.3 8.4 - 10.5 mg/dL  Lipid panel  Result Value Ref Range   Cholesterol 163 0 - 200 mg/dL   Triglycerides 187.0 (H) 0.0 - 149.0 mg/dL   HDL 48.40 >39.00 mg/dL   VLDL 37.4  0.0 - 40.0 mg/dL   LDL Cholesterol 77 0 - 99 mg/dL   Total CHOL/HDL Ratio 3    NonHDL 114.58    Assessment & Plan:   Problem List Items Addressed This Visit       Respiratory   Bronchitis - Primary    Patient signs symptoms consistent with acute bronchitis.  Given patient has lung disease either COPD or asthma we will go ahead and treat with azithromycin 250 mg pack.  Patient does not having shortness of breath or having use albuterol inhaler any more than normal.  Will defer steroids for now.  Did discuss signs and symptoms when to be seen urgent or emergently.  Follow-up if no improvement.  Also offered patient prescription cough medication, patient politely declined states over-the-counter medications working well      Relevant Medications   azithromycin (ZITHROMAX) 250 MG tablet     Meds ordered this encounter  Medications   azithromycin (ZITHROMAX) 250 MG tablet    Sig: Take 2 tablets on day 1, then 1 tablet daily on days 2 through 5    Dispense:  6 tablet    Refill:  0    Order Specific Question:   Supervising Provider    Answer:   TOWER, MARNE A [[id number]]   No orders of the defined types were placed in this encounter.   I discussed the assessment and treatment plan with the patient. The patient was provided an opportunity to ask questions and all were answered. The patient agreed with the plan and demonstrated an understanding of the instructions. The patient was advised to call back or seek an in-person evaluation if the symptoms worsen or if the condition fails to improve as anticipated.  Follow up plan: No follow-ups on file.  This visit occurred during the SARS-CoV-2 public  health emergency.  Safety protocols were in place, including screening questions prior to the visit, additional usage of staff PPE, and extensive cleaning of exam room while observing appropriate contact time as indicated for disinfecting solutions.   Romilda Garret, NP

## 2021-10-15 NOTE — Patient Instructions (Signed)
Nice to see you virtually today. Sent medications to pharmacy on file Follow up if no improvement or symptoms worsen

## 2021-11-28 ENCOUNTER — Ambulatory Visit (INDEPENDENT_AMBULATORY_CARE_PROVIDER_SITE_OTHER): Payer: PPO | Admitting: Family Medicine

## 2021-11-28 ENCOUNTER — Encounter: Payer: Self-pay | Admitting: Family Medicine

## 2021-11-28 DIAGNOSIS — L601 Onycholysis: Secondary | ICD-10-CM

## 2021-11-28 NOTE — Progress Notes (Signed)
? ? Patient ID: Jennifer Pineda, female    DOB: 1950/10/16, 71 y.o.   MRN: [id number] ? ?This visit was conducted in person. ? ?BP 134/70   Pulse 65   Temp (!) 97.5 ?F (36.4 ?C) (Temporal)   Ht 5' 3" (1.6 m)   Wt 179 lb 6 oz (81.4 kg)   SpO2 98%   BMI 31.77 kg/m?   ? ?CC: check toenail ?Subjective:  ? ?HPI: ?Jennifer Pineda is a 71 y.o. female presenting on 11/28/2021 for Nail Problem (C/o toenail on bilateral feet.  Nails are coming off.  Started about 1 mo ago.  Tried OTC antifungal, not helpful. ) ? ? ?She gets mani/pedi - 1 month ago noted toenail coming off.  ?No pain, no itching, no redness of the toe.  ?No h/o psoriasis, no associated skin rash.  ?She doesn't use tight fitting shoes.  ?Denies trauma.  ?No recent abx.  ? ?She's been using OTC antifungal nail lacquer for the past month.  ? ?No new lotions, soaps, medicines, vitamins or supplements.  ?No change in diet.  ?She has been eating out more - on trips for granddaughter's volley ball team.  ?   ? ?Relevant past medical, surgical, family and social history reviewed and updated as indicated. Interim medical history since our last visit reviewed. ?Allergies and medications reviewed and updated. ?Outpatient Medications Prior to Visit  ?Medication Sig Dispense Refill  ? albuterol (VENTOLIN HFA) 108 (90 Base) MCG/ACT inhaler Inhale 2 puffs into the lungs every 6 (six) hours as needed. For shortness of breath. 18 g 6  ? amLODipine (NORVASC) 10 MG tablet Take 1 tablet (10 mg total) by mouth daily. 90 tablet 3  ? atorvastatin (LIPITOR) 10 MG tablet Take 1 tablet (10 mg total) by mouth daily. 90 tablet 3  ? Blood Glucose Monitoring Suppl (ONE TOUCH ULTRA SYSTEM KIT) w/Device KIT 1 kit by Does not apply route once. 1 each 0  ? FLUoxetine (PROZAC) 10 MG capsule Take 1 capsule (10 mg total) by mouth daily. 90 capsule 3  ? fluticasone (CUTIVATE) 0.05 % cream Apply topically. For lips    ? fluticasone (FLONASE) 50 MCG/ACT nasal spray Use 2 spray(s) in each  nostril once daily 48 g 3  ? fluticasone furoate-vilanterol (BREO ELLIPTA) 100-25 MCG/INH AEPB Inhale 1 puff into the lungs daily. 90 each 3  ? GENTAMICIN SULFATE EX Apply topically. For nose    ? glucose blood (ONE TOUCH ULTRA TEST) test strip 1 each by Other route daily. Use to check sugar once daily and as needed. Dx: E11.9 100 each 3  ? lisinopril (ZESTRIL) 10 MG tablet Take 1 tablet (10 mg total) by mouth daily. 90 tablet 3  ? loratadine (CLARITIN) 10 MG tablet TAKE 1 TABLET BY MOUTH ONCE DAILY 90 tablet 3  ? metFORMIN (GLUCOPHAGE XR) 500 MG 24 hr tablet Take 1 tablet (500 mg total) by mouth daily with breakfast. 90 tablet 3  ? Multiple Vitamins-Calcium (VIACTIV MULTI-VITAMIN) CHEW Chew 1 tablet by mouth daily.    ? Multiple Vitamins-Minerals (PRESERVISION AREDS PO) Take by mouth at bedtime.    ? omeprazole (PRILOSEC) 40 MG capsule Take 1 capsule (40 mg total) by mouth daily. 90 capsule 3  ? ONETOUCH DELICA LANCETS FINE MISC Use as directed - check as needed or 3 times weekly 100 each 1  ? triamterene-hydrochlorothiazide (MAXZIDE-25) 37.5-25 MG tablet Take 1 tablet by mouth daily. 90 tablet 3  ? ?No facility-administered medications prior to visit.  ?  ? ?  Per HPI unless specifically indicated in ROS section below ?Review of Systems ? ?Objective:  ?BP 134/70   Pulse 65   Temp (!) 97.5 ?F (36.4 ?C) (Temporal)   Ht 5' 3" (1.6 m)   Wt 179 lb 6 oz (81.4 kg)   SpO2 98%   BMI 31.77 kg/m?   ?Wt Readings from Last 3 Encounters:  ?11/28/21 179 lb 6 oz (81.4 kg)  ?03/20/21 177 lb 1 oz (80.3 kg)  ?09/18/20 191 lb 8 oz (86.9 kg)  ?  ?  ?Physical Exam ?Vitals and nursing note reviewed.  ?Constitutional:   ?   Appearance: Normal appearance. She is not ill-appearing.  ?Skin: ?   General: Skin is warm and dry.  ?   Findings: No rash.  ?   Comments:  ?Onycholytic great toenails, other nails affected to lesser extent (distal separation of nail plate) ?No surrounding erythema or discharge  ?No psoriatic or lichen planus rash    ?Neurological:  ?   Mental Status: She is alert.  ?Psychiatric:     ?   Mood and Affect: Mood normal.     ?   Behavior: Behavior normal.  ? ?   ? ?Assessment & Plan:  ? ?Problem List Items Addressed This Visit   ? ? Onycholysis of toenail  ?  Of unclear etiology.  ?Not consistent with traumatic onycholysis, no contributory medication noted, no evidence of psoriasis or of lichen planus,  ??onychomycosis.  ?rec continue funginail lacquer, add biotin x 1 month to monitor effect.  ?Update if ongoing or worsening or new symptoms develop, consider podiatry evaluation.  ?Pt agrees with plan.  ?rec avoid nail cosmetics.  ?  ?  ?  ? ?No orders of the defined types were placed in this encounter. ? ?No orders of the defined types were placed in this encounter. ? ? ? ?Patient Instructions  ?Possible onycholysis with brittle nails. Unsure cause ?fungal infection. Continue funginail over the counter antifungal nail lacquer.  ?Could try biotin hair/nail vitamin as well.  ?If ongoing, let me know to consider seeing the podiatrist.  ? ?Follow up plan: ?Return if symptoms worsen or fail to improve. ? ?Ria Bush, MD   ?

## 2021-11-28 NOTE — Assessment & Plan Note (Addendum)
Of unclear etiology.  ?Not consistent with traumatic onycholysis, no contributory medication noted, no evidence of psoriasis or of lichen planus,  ??onychomycosis.  ?rec continue funginail lacquer, add biotin x 1 month to monitor effect.  ?Update if ongoing or worsening or new symptoms develop, consider podiatry evaluation.  ?Pt agrees with plan.  ?rec avoid nail cosmetics.  ?

## 2021-11-28 NOTE — Patient Instructions (Signed)
Possible onycholysis with brittle nails. Unsure cause ?fungal infection. Continue funginail over the counter antifungal nail lacquer.  ?Could try biotin hair/nail vitamin as well.  ?If ongoing, let me know to consider seeing the podiatrist.  ?

## 2022-03-14 ENCOUNTER — Ambulatory Visit: Payer: PPO

## 2022-03-14 ENCOUNTER — Other Ambulatory Visit: Payer: Self-pay | Admitting: Family Medicine

## 2022-03-14 DIAGNOSIS — E119 Type 2 diabetes mellitus without complications: Secondary | ICD-10-CM

## 2022-03-14 DIAGNOSIS — E1169 Type 2 diabetes mellitus with other specified complication: Secondary | ICD-10-CM

## 2022-03-15 ENCOUNTER — Other Ambulatory Visit (INDEPENDENT_AMBULATORY_CARE_PROVIDER_SITE_OTHER): Payer: PPO

## 2022-03-15 DIAGNOSIS — E785 Hyperlipidemia, unspecified: Secondary | ICD-10-CM

## 2022-03-15 DIAGNOSIS — E119 Type 2 diabetes mellitus without complications: Secondary | ICD-10-CM

## 2022-03-15 DIAGNOSIS — E1169 Type 2 diabetes mellitus with other specified complication: Secondary | ICD-10-CM | POA: Diagnosis not present

## 2022-03-15 LAB — COMPREHENSIVE METABOLIC PANEL
ALT: 9 U/L (ref 0–35)
AST: 11 U/L (ref 0–37)
Albumin: 4.2 g/dL (ref 3.5–5.2)
Alkaline Phosphatase: 68 U/L (ref 39–117)
BUN: 16 mg/dL (ref 6–23)
CO2: 28 mEq/L (ref 19–32)
Calcium: 9.1 mg/dL (ref 8.4–10.5)
Chloride: 99 mEq/L (ref 96–112)
Creatinine, Ser: 0.76 mg/dL (ref 0.40–1.20)
GFR: 78.02 mL/min (ref >60.00)
Glucose, Bld: 166 mg/dL — ABNORMAL HIGH (ref 70–99)
Potassium: 3.9 mEq/L (ref 3.5–5.1)
Sodium: 138 mEq/L (ref 135–145)
Total Bilirubin: 0.4 mg/dL (ref 0.2–1.2)
Total Protein: 7.4 g/dL (ref 6.0–8.3)

## 2022-03-15 LAB — LIPID PANEL
Cholesterol: 166 mg/dL (ref 0–200)
HDL: 48.4 mg/dL (ref >39.00)
LDL Cholesterol: 81 mg/dL (ref 0–99)
NonHDL: 117.43
Total CHOL/HDL Ratio: 3
Triglycerides: 182 mg/dL — ABNORMAL HIGH (ref 0.0–149.0)
VLDL: 36.4 mg/dL (ref 0.0–40.0)

## 2022-03-15 LAB — MICROALBUMIN / CREATININE URINE RATIO
Creatinine,U: 131.7 mg/dL
Microalb Creat Ratio: 1.1 mg/g (ref 0.0–30.0)
Microalb, Ur: 1.4 mg/dL (ref 0.0–1.9)

## 2022-03-15 LAB — HEMOGLOBIN A1C: Hgb A1c MFr Bld: 6.2 % (ref 4.6–6.5)

## 2022-03-20 DIAGNOSIS — M25569 Pain in unspecified knee: Secondary | ICD-10-CM | POA: Insufficient documentation

## 2022-03-21 ENCOUNTER — Ambulatory Visit (INDEPENDENT_AMBULATORY_CARE_PROVIDER_SITE_OTHER): Payer: PPO

## 2022-03-21 VITALS — Wt 179.0 lb

## 2022-03-21 DIAGNOSIS — Z0000 Encounter for general adult medical examination without abnormal findings: Secondary | ICD-10-CM

## 2022-03-21 NOTE — Progress Notes (Signed)
Virtual Visit via Telephone Note  I connected with  Jennifer Pineda on 03/21/22 at  1:00 PM EDT by telephone and verified that I am speaking with the correct person using two identifiers.  Location: Patient: home Provider: [place] Persons participating in the virtual visit: [place]   I discussed the limitations, risks, security and privacy concerns of performing an evaluation and management service by telephone and the availability of in person appointments. The patient expressed understanding and agreed to proceed.  Interactive audio and video telecommunications were attempted between this nurse and patient, however failed, due to patient having technical difficulties OR patient did not have access to video capability.  We continued and completed visit with audio only.  Some vital signs may be absent or patient reported.   Dionisio David, LPN  Subjective:   Jennifer Pineda is a 71 y.o. female who presents for Medicare Annual (Subsequent) preventive examination.  Review of Systems           Objective:    There were no vitals filed for this visit. There is no height or weight on file to calculate BMI.     03/13/2020   10:40 AM 02/18/2019   12:32 PM 08/21/2018    7:59 AM 11/11/2017    1:01 PM 09/30/2016    2:19 PM 10/05/2015    1:48 PM 08/15/2015    9:34 AM  Advanced Directives  Does Patient Have a Medical Advance Directive? _0  Yes Yes  Type of Paramedic of [place];Living will [place];Living will [place];Living will [place];Living will [place];Living will Living will;Healthcare Power of [place]  Does patient want to make changes to medical advance directive?      No - Patient declined No - Patient declined  Copy of [place] in Chart? Yes - validated most recent copy scanned  in chart (See row information) Yes - validated most recent copy scanned in chart (See row information) No - copy requested Yes No - copy requested  No - copy requested    Current Medications (verified) Outpatient Encounter Medications as of 03/21/2022  Medication Sig   albuterol (VENTOLIN HFA) 108 (90 Base) MCG/ACT inhaler Inhale 2 puffs into the lungs every 6 (six) hours as needed. For shortness of breath.   amLODipine (NORVASC) 10 MG tablet Take 1 tablet (10 mg total) by mouth daily.   atorvastatin (LIPITOR) 10 MG tablet Take 1 tablet (10 mg total) by mouth daily.   Blood Glucose Monitoring Suppl (ONE TOUCH ULTRA SYSTEM KIT) w/Device KIT 1 kit by Does not apply route once.   FLUoxetine (PROZAC) 10 MG capsule Take 1 capsule (10 mg total) by mouth daily.   fluticasone (CUTIVATE) 0.05 % cream Apply topically. For lips   fluticasone (FLONASE) 50 MCG/ACT nasal spray Use 2 spray(s) in each nostril once daily   fluticasone furoate-vilanterol (BREO ELLIPTA) 100-25 MCG/INH AEPB Inhale 1 puff into the lungs daily.   glucose blood (ONE TOUCH ULTRA TEST) test strip 1 each by Other route daily. Use to check sugar once daily and as needed. Dx: E11.9   hydrocortisone 2.5 % ointment    Influenza vac split quadrivalent PF (FLUZONE HIGH-DOSE) 0.5 ML injection    lisinopril (ZESTRIL) 10 MG tablet Take 1 tablet (10 mg total) by mouth daily.   lisinopril (ZESTRIL) 5 MG tablet Take 1 tablet by mouth  daily.   loratadine (CLARITIN) 10 MG tablet TAKE 1 TABLET BY MOUTH ONCE DAILY   metFORMIN (GLUCOPHAGE XR) 500 MG 24 hr tablet Take 1 tablet (500 mg total) by mouth daily with breakfast.   Multiple Vitamins-Calcium (VIACTIV MULTI-VITAMIN) CHEW Chew 1 tablet by mouth daily.   Multiple Vitamins-Minerals (PRESERVISION AREDS PO) Take by mouth at bedtime.   ofloxacin (FLOXIN) 0.3 % OTIC solution    omeprazole (PRILOSEC) 40 MG capsule Take 1 capsule (40 mg total) by mouth daily.   ONETOUCH DELICA LANCETS FINE MISC Use as  directed - check as needed or 3 times weekly   tiotropium (SPIRIVA HANDIHALER) 18 MCG inhalation capsule    triamterene-hydrochlorothiazide (MAXZIDE-25) 37.5-25 MG tablet Take 1 tablet by mouth daily.   meloxicam (MOBIC) 15 MG tablet Take 1 tablet every day by oral route. (Patient not taking: Reported on 03/21/2022)   phenazopyridine (PYRIDIUM) 200 MG tablet Take 1 tablet by mouth 3 (three) times daily. (Patient not taking: Reported on 03/21/2022)   [DISCONTINUED] amLODipine (NORVASC) 10 MG tablet Take 1 tablet by mouth daily.   [DISCONTINUED] amoxicillin-clavulanate (AUGMENTIN) 875-125 MG tablet Take 1 tablet by mouth 2 (two) times daily.   [DISCONTINUED] atorvastatin (LIPITOR) 10 MG tablet Take 1 tablet by mouth daily.   [DISCONTINUED] cephALEXin (KEFLEX) 500 MG capsule Take 1 capsule by mouth 2 (two) times daily.   [DISCONTINUED] GENTAMICIN SULFATE EX Apply topically. For nose (Patient not taking: Reported on 03/21/2022)   [DISCONTINUED] ketoconazole (NIZORAL) 2 % cream  (Patient not taking: Reported on 03/21/2022)   [DISCONTINUED] lisinopril (ZESTRIL) 10 MG tablet Take 1 tablet by mouth daily.   [DISCONTINUED] loratadine (CLARITIN) 10 MG tablet Take 1 tablet by mouth daily.   [DISCONTINUED] metFORMIN (GLUCOPHAGE) 500 MG tablet Take 1 tablet by mouth daily with breakfast.   [DISCONTINUED] omeprazole (PRILOSEC) 40 MG capsule Take 1 capsule by mouth daily.   No facility-administered encounter medications on file as of 03/21/2022.    Allergies (verified) Levofloxacin, Doxycycline hyclate, Metoprolol succinate, Other, Tetracycline hcl, and Adhesive [tape]   History: Past Medical History:  Diagnosis Date   Allergic rhinitis    Anxiety and depression    Arthritis    hands   Asthma, persistent    Basal cell carcinoma 12/2019   s/p excision, margins clear (Dr Pearline Cables)   CAD (coronary artery disease)    per CT [id number]   Complication of anesthesia    slow to wake   COPD, mild ([place])     pulmologist--  dr Vilinda Boehringer ([facility] in [place])   Diet-controlled diabetes mellitus ([place]) 05/01/2011   Completed DSME 10/2015    Diverticulosis of colon    Essential hypertension    GERD (gastroesophageal reflux disease)    History of cellulitis    Nov 2015-- nasal cellulitis--  resolved   History of endometriosis    History of hiatal hernia    Hyperlipidemia    OSA on CPAP    severe per study 05-09-2010   PONV (postoperative nausea and vomiting)    Shingles 12/29/2006   Sleep apnea    Urothelial carcinoma ([place]) 2016   superficial papillary L lateral bladder wall s/p BCG therapy Junious Silk)   Past Surgical History:  Procedure Laterality Date   ABDOMINAL HYSTERECTOMY  1983   and Appendectomy   CARDIOVASCULAR STRESS TEST  10-06-1998   no evidence ischemia or scar/  normal LV function and wall motion, ef 65%   COLONOSCOPY  09/2012   small int hem ([facility]) rpt 10  yrs   CYSTOSCOPY WITH BIOPSY N/A 08/15/2015   Procedure: CYSTOSCOPY WITH BLADDER RESECTION;  Surgeon: Festus Aloe, MD;  [facility];  Service: Urology;  Laterality: N/A;   ESOPHAGOGASTRODUODENOSCOPY  last one 08/ 2001   LUMBAR LAMINECTOMY/DECOMPRESSION MICRODISCECTOMY  10/ 2013   SEPTOPLASTY  03/  2002   SKIN SURGERY  01/12/2020   mole removed from back   TRANSURETHRAL RESECTION OF BLADDER TUMOR N/A 07/11/2015   Procedure: TRANSURETHRAL RESECTION OF BLADDER TUMOR (TURBT);  Surgeon: Festus Aloe, MD;  Location: [facility];  Service: Urology;  Laterality: N/A;   TRANSURETHRAL RESECTION OF BLADDER TUMOR N/A 08/15/2015   Procedure: TRANSURETHRAL RESECTION OF BLADDER TUMOR (TURBT);  Surgeon: Festus Aloe, MD;  Location: [facility];  Service: Urology;  Laterality: N/A;   TRIGGER FINGER RELEASE Right 2014   Family History  Problem Relation Age of Onset   Cancer Mother        kidney and lymphoma CA, neprectomy tumor of lung, non smoker   Kidney  disease Mother        T-cell cancer in kidney   Aneurysm Father        ? in throat   Diabetes Father    Heart disease Father        MI   Diabetes Sister    Hyperlipidemia Sister    Heart disease Brother 52       MI   Alcohol abuse Brother        smokes again   Hypertension Brother    Cancer Brother 30       T-cell renal cancer/ removed   Hypertension Brother    Colon cancer Neg Hx    Esophageal cancer Neg Hx    Rectal cancer Neg Hx    Stomach cancer Neg Hx    Breast cancer Neg Hx    Social History   Socioeconomic History   Marital status: Divorced    Spouse name: Not on file   Number of children: 2   Years of education: Not on file   Highest education level: Not on file  Occupational History   Occupation: Event organiser: [employer]    Comment: [employer]  Tobacco Use   Smoking status: Former    Packs/day: 2.00    Years: 19.00    Total pack years: 38.00    Types: Cigarettes    Quit date: 08/26/1986    Years since quitting: 35.5   Smokeless tobacco: Never  Vaping Use   Vaping Use: Never used  Substance and Sexual Activity   Alcohol use: Yes    Comment: 3 margaritas a week    Drug use: No   Sexual activity: Never  Other Topics Concern   Not on file  Social History Narrative   Divorced   Daughter [name], lives with her   Children: 2 children // one daughter lives with mother/ son lives with father   Activity: 20 min walk daily   Diet: good water, fruits/vegetables daily.    Social Determinants of Health   Financial Resource Strain: Low Risk  (04/09/2021)   Overall Financial Resource Strain (CARDIA)    Difficulty of Paying Living Expenses: Not very hard  Food Insecurity: No Food Insecurity (03/13/2020)   Hunger Vital Sign    Worried About Running Out of Food in the Last Year: Never true    Ran Out of Food in the Last Year: Never true  Transportation  Needs: No Transportation Needs (03/13/2020)   PRAPARE - Armed forces logistics/support/administrative officer (Medical): No    Lack of Transportation (Non-Medical): No  Physical Activity: Insufficiently Active (03/21/2022)   Exercise Vital Sign    Days of Exercise per Week: 3 days    Minutes of Exercise per Session: 30 min  Stress: Stress Concern Present (03/21/2022)   [place]    Feeling of Stress : To some extent  Social Connections: Not on file    Tobacco Counseling Counseling given: Not Answered   Clinical Intake:  Pre-visit preparation completed: Yes  Pain : No/denies pain     Nutritional Risks: None Diabetes: Yes CBG done?: No Did pt. bring in CBG monitor from home?: No  How often do you need to have someone help you when you read instructions, pamphlets, or other written materials from your doctor or pharmacy?: 1 - Never  Diabetic?yes Nutrition Risk Assessment:  Has the patient had any N/V/D within the last 2 months?  Yes  Does the patient have any non-healing wounds?  No  Has the patient had any unintentional weight loss or weight gain?  No   Diabetes:  Is the patient diabetic?  Yes  If diabetic, was a CBG obtained today?  No  Did the patient bring in their glucometer from home?  No  How often do you monitor your CBG's? occasionally.   Financial Strains and Diabetes Management:  Are you having any financial strains with the device, your supplies or your medication? No .  Does the patient want to be seen by Chronic Care Management for management of their diabetes?  No  Would the patient like to be referred to a Nutritionist or for Diabetic Management?  No   Diabetic Exams:  Diabetic Eye Exam: Completed 01/08/21. Overdue for diabetic eye exam. Pt has been advised about the importance in completing this exam.   Diabetic Foot Exam: Completed 09/18/20. Pt has been advised about the importance in completing this exam.   Interpreter Needed?: No  Information entered by :: Kirke Shaggy, LPN   Activities of Daily Living     No data to display          Patient Care Team: Ria Bush, MD as PCP - General (Family Medicine) Arelia Sneddon, [place] as Consulting Physician (Optometry) Festus Aloe, MD as Consulting Physician (Urology) Beverly Gust, MD as Consulting Physician (Otolaryngology) Vilinda Boehringer, MD (Inactive) as Consulting Physician (Internal Medicine) [place], Blaine Hamper, PA-C as Physician Assistant (Physician Assistant) Debbora Dus, [facility] as Pharmacist (Pharmacist)  Indicate any recent Medical Services you may have received from other than [facility] providers in the past year (date may be approximate).     Assessment:   This is a routine wellness examination for Jennifer Pineda.  Hearing/Vision screen No results found.  Dietary issues and exercise activities discussed:     Goals Addressed             This Visit's Progress    DIET - EAT MORE FRUITS AND VEGETABLES         Depression Screen    03/21/2022    1:17 PM 03/20/2021    9:52 AM 03/13/2020   10:44 AM 02/18/2019   12:40 PM 11/11/2017    1:01 PM 09/30/2016    2:18 PM 10/05/2015    1:49 PM  PHQ 2/9 Scores  PHQ - 2 Score 0 0 0 0 0 0 0  PHQ- 9 Score  1 2 0 0 0      Fall Risk    03/20/2021    9:14 AM 03/13/2020   10:43 AM 02/18/2019   12:40 PM 11/11/2017    1:01 PM 09/30/2016    2:18 PM  [place] in the past year? 1 0 0 No No  Number falls in past yr: 0 0     Injury with Fall? 0 0     Risk for fall due to :  Medication side effect     Follow up  Falls evaluation completed;Falls prevention discussed       FALL RISK PREVENTION PERTAINING TO THE HOME:  Any stairs in or around the home? No  If so, are there any without handrails? No  Home free of loose throw rugs in walkways, pet beds, electrical cords, etc? Yes  Adequate lighting in your home to reduce risk of falls? Yes   ASSISTIVE DEVICES UTILIZED TO PREVENT FALLS:  Life alert? No  Use of a cane, walker or w/c?  No  Grab bars in the bathroom? No  Shower chair or bench in shower? No  Elevated toilet seat or a handicapped toilet? No     Cognitive Function:declined      03/13/2020   10:47 AM 02/18/2019   12:45 PM 11/11/2017    1:01 PM 09/30/2016    2:19 PM  MMSE - Mini Mental State Exam  Orientation to time _0 Orientation to Place _1 Registration _2 Attention/ Calculation 5 0 0 0  Recall _3 Language- name 2 objects  0 0 0  Language- repeat _4 Language- follow 3 step command  0 3 3  Language- read & follow direction  0 0 0  Write a sentence  0 0 0  Copy design  0 0 0  Total score  _5 Immunizations Immunization History  Administered Date(s) Administered   Fluad Quad(high Dose 65+) 09/18/2020   Influenza, High Dose Seasonal PF 07/07/2017   Influenza,inj,Quad PF,6+ Mos 09/08/2015, 10/07/2016, 05/20/2018   Moderna Sars-Covid-2 Vaccination 10/31/2019, 12/01/2019   Pneumococcal Conjugate-13 05/11/2014   Pneumococcal Polysaccharide-23 09/08/2015   Td 02/11/2004   Tdap 05/11/2014   Zoster Recombinat (Shingrix) 04/04/2021, 08/06/2021   Zoster, Live 06/08/2014    TDAP status: Up to date  Flu Vaccine status: Up to date  Pneumococcal vaccine status: Up to date  Covid-19 vaccine status: Completed vaccines  Qualifies for Shingles Vaccine? Yes   Zostavax completed Yes   Shingrix Completed?: Yes  Screening Tests Health Maintenance  Topic Date Due   COVID-19 Vaccine (3 - Moderna series) 01/26/2020   FOOT EXAM  09/18/2021   OPHTHALMOLOGY EXAM  01/08/2022   INFLUENZA VACCINE  03/26/2022   MAMMOGRAM  09/11/2022   HEMOGLOBIN A1C  09/15/2022   COLONOSCOPY (Pts 45-26yr Insurance coverage will need to be confirmed)  10/14/2022   TETANUS/TDAP  05/11/2024   Pneumonia Vaccine 71 Years old  Completed   DEXA SCAN  Completed   Hepatitis C Screening  Completed   Zoster Vaccines- Shingrix  Completed   HPV VACCINES  Aged Out    Health  Maintenance  Health Maintenance Due  Topic Date Due   COVID-19 Vaccine (3 - Moderna series) 01/26/2020   FOOT EXAM  09/18/2021   OPHTHALMOLOGY EXAM  01/08/2022    Colorectal cancer screening: Type  of screening: Colonoscopy. Completed 10/14/12. Repeat every 10 years  Mammogram status: Completed 09/11/21. Repeat every year  Bone Density status: Completed 07/18/14. Results reflect: Bone density results: NORMAL. Repeat every 5 years.  Lung Cancer Screening: (Low Dose CT Chest recommended if Age 56-80 years, 30 pack-year currently smoking OR have quit w/in 15years.) does not qualify.    Additional Screening:  Hepatitis C Screening: does qualify; Completed 09/30/16  Vision Screening: Recommended annual ophthalmology exams for early detection of glaucoma and other disorders of the eye. Is the patient up to date with their annual eye exam?  Yes  Who is the provider or what is the name of the office in which the patient attends annual eye exams? [facility] If pt is not established with a provider, would they like to be referred to a provider to establish care? No .   Dental Screening: Recommended annual dental exams for proper oral hygiene  Community Resource Referral / Chronic Care Management: CRR required this visit?  No   CCM required this visit?  No      Plan:     I have personally reviewed and noted the following in the patient's chart:   Medical and social history Use of alcohol, tobacco or illicit drugs  Current medications and supplements including opioid prescriptions.  Functional ability and status Nutritional status Physical activity Advanced directives List of other physicians Hospitalizations, surgeries, and ER visits in previous 12 months Vitals Screenings to include cognitive, depression, and falls Referrals and appointments  In addition, I have reviewed and discussed with patient certain preventive protocols, quality metrics, and best practice  recommendations. A written personalized care plan for preventive services as well as general preventive health recommendations were provided to patient.     Dionisio David, LPN   [id number]   Nurse Notes: none

## 2022-03-21 NOTE — Patient Instructions (Signed)
Jennifer Pineda , Thank you for taking time to come for your Medicare Wellness Visit. I appreciate your ongoing commitment to your health goals. Please review the following plan we discussed and let me know if I can assist you in the future.   Screening recommendations/referrals: Colonoscopy: 10/14/12 Mammogram: 09/11/21 Bone Density: 07/18/14 Recommended yearly ophthalmology/optometry visit for glaucoma screening and checkup Recommended yearly dental visit for hygiene and checkup  Vaccinations: Influenza vaccine: 09/18/20 Pneumococcal vaccine: 09/08/15 Tdap vaccine: 05/11/14 Shingles vaccine: Zoster 06/08/14  Shingrix 04/04/21, 08/06/21   Covid-19:10/31/19, 12/01/19  Advanced directives: yes  Conditions/risks identified: no  Next appointment: Follow up in one year for your annual wellness visit 03/25/23 @ 11:30 am by phone   Preventive Care 65 Years and Older, Female Preventive care refers to lifestyle choices and visits with your health care provider that can promote health and wellness. What does preventive care include? A yearly physical exam. This is also called an annual well check. Dental exams once or twice a year. Routine eye exams. Ask your health care provider how often you should have your eyes checked. Personal lifestyle choices, including: Daily care of your teeth and gums. Regular physical activity. Eating a healthy diet. Avoiding tobacco and drug use. Limiting alcohol use. Practicing safe sex. Taking low-dose aspirin every day. Taking vitamin and mineral supplements as recommended by your health care provider. What happens during an annual well check? The services and screenings done by your health care provider during your annual well check will depend on your age, overall health, lifestyle risk factors, and family history of disease. Counseling  Your health care provider may ask you questions about your: Alcohol use. Tobacco use. Drug use. Emotional well-being. Home  and relationship well-being. Sexual activity. Eating habits. History of falls. Memory and ability to understand (cognition). Work and work Statistician. Reproductive health. Screening  You may have the following tests or measurements: Height, weight, and BMI. Blood pressure. Lipid and cholesterol levels. These may be checked every 5 years, or more frequently if you are over 37 years old. Skin check. Lung cancer screening. You may have this screening every year starting at age 56 if you have a 30-pack-year history of smoking and currently smoke or have quit within the past 15 years. Fecal occult blood test (FOBT) of the stool. You may have this test every year starting at age 45. Flexible sigmoidoscopy or colonoscopy. You may have a sigmoidoscopy every 5 years or a colonoscopy every 10 years starting at age 53. Hepatitis C blood test. Hepatitis B blood test. Sexually transmitted disease (STD) testing. Diabetes screening. This is done by checking your blood sugar (glucose) after you have not eaten for a while (fasting). You may have this done every 1-3 years. Bone density scan. This is done to screen for osteoporosis. You may have this done starting at age 14. Mammogram. This may be done every 1-2 years. Talk to your health care provider about how often you should have regular mammograms. Talk with your health care provider about your test results, treatment options, and if necessary, the need for more tests. Vaccines  Your health care provider may recommend certain vaccines, such as: Influenza vaccine. This is recommended every year. Tetanus, diphtheria, and acellular pertussis (Tdap, Td) vaccine. You may need a Td booster every 10 years. Zoster vaccine. You may need this after age 57. Pneumococcal 13-valent conjugate (PCV13) vaccine. One dose is recommended after age 59. Pneumococcal polysaccharide (PPSV23) vaccine. One dose is recommended after age 43. Talk  to your health care provider  about which screenings and vaccines you need and how often you need them. This information is not intended to replace advice given to you by your health care provider. Make sure you discuss any questions you have with your health care provider. Document Released: 09/08/2015 Document Revised: 05/01/2016 Document Reviewed: 06/13/2015 Elsevier Interactive Patient Education  2017 [place] Prevention in the Home Falls can cause injuries. They can happen to people of all ages. There are many things you can do to make your home safe and to help prevent falls. What can I do on the outside of my home? Regularly fix the edges of walkways and driveways and fix any cracks. Remove anything that might make you trip as you walk through a door, such as a raised step or threshold. Trim any bushes or trees on the path to your home. Use bright outdoor lighting. Clear any walking paths of anything that might make someone trip, such as rocks or tools. Regularly check to see if handrails are loose or broken. Make sure that both sides of any steps have handrails. Any raised decks and porches should have guardrails on the edges. Have any leaves, snow, or ice cleared regularly. Use sand or salt on walking paths during winter. Clean up any spills in your garage right away. This includes oil or grease spills. What can I do in the bathroom? Use night lights. Install grab bars by the toilet and in the tub and shower. Do not use towel bars as grab bars. Use non-skid mats or decals in the tub or shower. If you need to sit down in the shower, use a plastic, non-slip stool. Keep the floor dry. Clean up any water that spills on the floor as soon as it happens. Remove soap buildup in the tub or shower regularly. Attach bath mats securely with double-sided non-slip rug tape. Do not have throw rugs and other things on the floor that can make you trip. What can I do in the bedroom? Use night lights. Make sure  that you have a light by your bed that is easy to reach. Do not use any sheets or blankets that are too big for your bed. They should not hang down onto the floor. Have a firm chair that has side arms. You can use this for support while you get dressed. Do not have throw rugs and other things on the floor that can make you trip. What can I do in the kitchen? Clean up any spills right away. Avoid walking on wet floors. Keep items that you use a lot in easy-to-reach places. If you need to reach something above you, use a strong step stool that has a grab bar. Keep electrical cords out of the way. Do not use floor polish or wax that makes floors slippery. If you must use wax, use non-skid floor wax. Do not have throw rugs and other things on the floor that can make you trip. What can I do with my stairs? Do not leave any items on the stairs. Make sure that there are handrails on both sides of the stairs and use them. Fix handrails that are broken or loose. Make sure that handrails are as long as the stairways. Check any carpeting to make sure that it is firmly attached to the stairs. Fix any carpet that is loose or worn. Avoid having throw rugs at the top or bottom of the stairs. If you do have throw rugs,  attach them to the floor with carpet tape. Make sure that you have a light switch at the top of the stairs and the bottom of the stairs. If you do not have them, ask someone to add them for you. What else can I do to help prevent falls? Wear shoes that: Do not have high heels. Have rubber bottoms. Are comfortable and fit you well. Are closed at the toe. Do not wear sandals. If you use a stepladder: Make sure that it is fully opened. Do not climb a closed stepladder. Make sure that both sides of the stepladder are locked into place. Ask someone to hold it for you, if possible. Clearly mark and make sure that you can see: Any grab bars or handrails. First and last steps. Where the edge of  each step is. Use tools that help you move around (mobility aids) if they are needed. These include: Canes. Walkers. Scooters. Crutches. Turn on the lights when you go into a dark area. Replace any light bulbs as soon as they burn out. Set up your furniture so you have a clear path. Avoid moving your furniture around. If any of your floors are uneven, fix them. If there are any pets around you, be aware of where they are. Review your medicines with your doctor. Some medicines can make you feel dizzy. This can increase your chance of falling. Ask your doctor what other things that you can do to help prevent falls. This information is not intended to replace advice given to you by your health care provider. Make sure you discuss any questions you have with your health care provider. Document Released: 06/08/2009 Document Revised: 01/18/2016 Document Reviewed: 09/16/2014 Elsevier Interactive Patient Education  2017 [employer].

## 2022-03-22 ENCOUNTER — Encounter: Payer: Self-pay | Admitting: Family Medicine

## 2022-03-22 ENCOUNTER — Ambulatory Visit (INDEPENDENT_AMBULATORY_CARE_PROVIDER_SITE_OTHER): Payer: PPO | Admitting: Family Medicine

## 2022-03-22 VITALS — BP 126/64 | HR 66 | Temp 98.0°F | Ht 63.5 in | Wt 177.2 lb

## 2022-03-22 DIAGNOSIS — F341 Dysthymic disorder: Secondary | ICD-10-CM | POA: Diagnosis not present

## 2022-03-22 DIAGNOSIS — Z8551 Personal history of malignant neoplasm of bladder: Secondary | ICD-10-CM | POA: Diagnosis not present

## 2022-03-22 DIAGNOSIS — K219 Gastro-esophageal reflux disease without esophagitis: Secondary | ICD-10-CM

## 2022-03-22 DIAGNOSIS — Z0000 Encounter for general adult medical examination without abnormal findings: Secondary | ICD-10-CM

## 2022-03-22 DIAGNOSIS — J449 Chronic obstructive pulmonary disease, unspecified: Secondary | ICD-10-CM | POA: Diagnosis not present

## 2022-03-22 DIAGNOSIS — J4530 Mild persistent asthma, uncomplicated: Secondary | ICD-10-CM | POA: Diagnosis not present

## 2022-03-22 DIAGNOSIS — I10 Essential (primary) hypertension: Secondary | ICD-10-CM | POA: Diagnosis not present

## 2022-03-22 DIAGNOSIS — Z0001 Encounter for general adult medical examination with abnormal findings: Secondary | ICD-10-CM

## 2022-03-22 DIAGNOSIS — E1169 Type 2 diabetes mellitus with other specified complication: Secondary | ICD-10-CM

## 2022-03-22 DIAGNOSIS — R0789 Other chest pain: Secondary | ICD-10-CM | POA: Insufficient documentation

## 2022-03-22 DIAGNOSIS — E119 Type 2 diabetes mellitus without complications: Secondary | ICD-10-CM | POA: Diagnosis not present

## 2022-03-22 DIAGNOSIS — E785 Hyperlipidemia, unspecified: Secondary | ICD-10-CM | POA: Diagnosis not present

## 2022-03-22 DIAGNOSIS — G4733 Obstructive sleep apnea (adult) (pediatric): Secondary | ICD-10-CM

## 2022-03-22 DIAGNOSIS — E669 Obesity, unspecified: Secondary | ICD-10-CM

## 2022-03-22 MED ORDER — METFORMIN HCL ER 500 MG PO TB24
500.0000 mg | ORAL_TABLET | Freq: Every day | ORAL | 3 refills | Status: DC
Start: 2022-03-22 — End: 2023-03-07

## 2022-03-22 MED ORDER — FLUTICASONE FUROATE-VILANTEROL 100-25 MCG/ACT IN AEPB: 1.0000 | INHALATION_SPRAY | Freq: Every day | RESPIRATORY_TRACT | 3 refills | Status: DC

## 2022-03-22 MED ORDER — AMLODIPINE BESYLATE 10 MG PO TABS
10.0000 mg | ORAL_TABLET | Freq: Every day | ORAL | 3 refills | Status: DC
Start: 2022-03-22 — End: 2023-03-07

## 2022-03-22 MED ORDER — FLUTICASONE PROPIONATE 50 MCG/ACT NA SUSP
2.0000 | Freq: Every day | NASAL | 3 refills | Status: DC
Start: 2022-03-22 — End: 2022-06-21

## 2022-03-22 MED ORDER — LISINOPRIL 10 MG PO TABS: 10.0000 mg | ORAL_TABLET | Freq: Every day | ORAL | 3 refills | Status: DC

## 2022-03-22 MED ORDER — ATORVASTATIN CALCIUM 40 MG PO TABS
40.0000 mg | ORAL_TABLET | Freq: Every day | ORAL | 3 refills | Status: DC
Start: 2022-03-22 — End: 2022-09-23

## 2022-03-22 MED ORDER — ASPIRIN 81 MG PO TBEC: 81.0000 mg | DELAYED_RELEASE_TABLET | Freq: Every day | ORAL | Status: AC

## 2022-03-22 MED ORDER — FLUOXETINE HCL 10 MG PO CAPS: 10.0000 mg | ORAL_CAPSULE | Freq: Every day | ORAL | 3 refills | Status: DC

## 2022-03-22 MED ORDER — TRIAMTERENE-HCTZ 37.5-25 MG PO TABS
1.0000 | ORAL_TABLET | Freq: Every day | ORAL | 3 refills | Status: DC
Start: 2022-03-22 — End: 2023-03-07

## 2022-03-22 MED ORDER — OMEPRAZOLE 40 MG PO CPDR: 40.0000 mg | DELAYED_RELEASE_CAPSULE | Freq: Every day | ORAL | 3 refills | Status: DC

## 2022-03-22 NOTE — Assessment & Plan Note (Signed)
Chronic, stable on current regimen.  

## 2022-03-22 NOTE — Assessment & Plan Note (Signed)
Discussed recent increased stress with son's drinking.  She feels stable on low dose prozac - continue this.

## 2022-03-22 NOTE — Assessment & Plan Note (Signed)
Continues yearly f/u with uro Junious Silk)

## 2022-03-22 NOTE — Assessment & Plan Note (Signed)
Encouraged ongoing weight loss efforts through healthy diet and regular exercise.

## 2022-03-22 NOTE — Assessment & Plan Note (Signed)
Preventative protocols reviewed and updated unless pt declined. Discussed healthy diet and lifestyle.  

## 2022-03-22 NOTE — Assessment & Plan Note (Signed)
Concerning description of chest discomfort associated with high stress period. Strong fmhx CAD, has significant risk factors for CAD (HTN, HLD, DM). Will start aspirin '81mg'$  daily, increase lipitor to '40mg'$  daily, update EKG, and expedite referral to cardiology. ER/911 precautions reviewed. Pt agrees with plan.

## 2022-03-22 NOTE — Assessment & Plan Note (Signed)
Chronic, adequate on low dose lipitor. With concerning chest pains, along with strong fmhx of CAD, will increase dose to '40mg'$  daily. The 10-year ASCVD risk score (Arnett DK, et al., 2019) is: 26.5%   Values used to calculate the score:     Age: 71 years     Sex: Female     Is Non-Hispanic African American: No     Diabetic: Yes     Tobacco smoker: No     Systolic Blood Pressure: 435 mmHg     Is BP treated: Yes     HDL Cholesterol: 48.4 mg/dL     Total Cholesterol: 166 mg/dL

## 2022-03-22 NOTE — Assessment & Plan Note (Addendum)
Continues CPAP use.  Last saw pulm 2019 - consider return.

## 2022-03-22 NOTE — Assessment & Plan Note (Signed)
Chronic, stable period on daily breo.

## 2022-03-22 NOTE — Assessment & Plan Note (Signed)
Continue daily breo with albuterol prn

## 2022-03-22 NOTE — Assessment & Plan Note (Signed)
Chronic, stable on metformin XR '500mg'$  daily - continue this.

## 2022-03-22 NOTE — Patient Instructions (Addendum)
Contact us in 6 months and we will order Cologuard for colon cancer screening.  Call and schedule diabetic eye exam.  For chest pain - start aspirin '81mg'$  daily. Increase lipitor to '40mg'$  daily for now, until we see heart doctor (new dose at pharmacy). We will refer you to cardiology. If recurrent chest pain that's not improving, go to ER.  EKG today  Return in 6 months for diabetes follow up visit.  Good to see you today  Health Maintenance After Age 33 After age 7, you are at a higher risk for certain long-term diseases and infections as well as injuries from falls. Falls are a major cause of broken bones and head injuries in people who are older than age 60. Getting regular preventive care can help to keep you healthy and well. Preventive care includes getting regular testing and making lifestyle changes as recommended by your health care provider. Talk with your health care provider about: Which screenings and tests you should have. A screening is a test that checks for a disease when you have no symptoms. A diet and exercise plan that is right for you. What should I know about screenings and tests to prevent falls? Screening and testing are the best ways to find a health problem early. Early diagnosis and treatment give you the best chance of managing medical conditions that are common after age 74. Certain conditions and lifestyle choices may make you more likely to have a fall. Your health care provider may recommend: Regular vision checks. Poor vision and conditions such as cataracts can make you more likely to have a fall. If you wear glasses, make sure to get your prescription updated if your vision changes. Medicine review. Work with your health care provider to regularly review all of the medicines you are taking, including over-the-counter medicines. Ask your health care provider about any side effects that may make you more likely to have a fall. Tell your health care provider if any  medicines that you take make you feel dizzy or sleepy. Strength and balance checks. Your health care provider may recommend certain tests to check your strength and balance while standing, walking, or changing positions. Foot health exam. Foot pain and numbness, as well as not wearing proper footwear, can make you more likely to have a fall. Screenings, including: Osteoporosis screening. Osteoporosis is a condition that causes the bones to get weaker and break more easily. Blood pressure screening. Blood pressure changes and medicines to control blood pressure can make you feel dizzy. Depression screening. You may be more likely to have a fall if you have a fear of falling, feel depressed, or feel unable to do activities that you used to do. Alcohol use screening. Using too much alcohol can affect your balance and may make you more likely to have a fall. Follow these instructions at home: Lifestyle Do not drink alcohol if: Your health care provider tells you not to drink. If you drink alcohol: Limit how much you have to: 0-1 drink a day for women. 0-2 drinks a day for men. Know how much alcohol is in your drink. In the [place] one drink equals one 12 oz bottle of beer (355 mL), one 5 oz glass of wine (148 mL), or one 1 oz glass of hard liquor (44 mL). Do not use any products that contain nicotine or tobacco. These products include cigarettes, chewing tobacco, and vaping devices, such as e-cigarettes. If you need help quitting, ask your health care provider. Activity  Follow a regular exercise program to stay fit. This will help you maintain your balance. Ask your health care provider what types of exercise are appropriate for you. If you need a cane or walker, use it as recommended by your health care provider. Wear supportive shoes that have nonskid soles. Safety  Remove any tripping hazards, such as rugs, cords, and clutter. Install safety equipment such as grab bars in bathrooms and  safety rails on stairs. Keep rooms and walkways well-lit. General instructions Talk with your health care provider about your risks for falling. Tell your health care provider if: You fall. Be sure to tell your health care provider about all falls, even ones that seem minor. You feel dizzy, tiredness (fatigue), or off-balance. Take over-the-counter and prescription medicines only as told by your health care provider. These include supplements. Eat a healthy diet and maintain a healthy weight. A healthy diet includes low-fat dairy products, low-fat (lean) meats, and fiber from whole grains, beans, and lots of fruits and vegetables. Stay current with your vaccines. Schedule regular health, dental, and eye exams. Summary Having a healthy lifestyle and getting preventive care can help to protect your health and wellness after age 66. Screening and testing are the best way to find a health problem early and help you avoid having a fall. Early diagnosis and treatment give you the best chance for managing medical conditions that are more common for people who are older than age 30. Falls are a major cause of broken bones and head injuries in people who are older than age 49. Take precautions to prevent a fall at home. Work with your health care provider to learn what changes you can make to improve your health and wellness and to prevent falls. This information is not intended to replace advice given to you by your health care provider. Make sure you discuss any questions you have with your health care provider. Document Revised: 01/01/2021 Document Reviewed: 01/01/2021 Elsevier Patient Education  [employer].

## 2022-03-22 NOTE — Progress Notes (Unsigned)
Patient ID: Jennifer Pineda, female    DOB: 24-Mar-1951, 71 y.o.   MRN: [id number]  This visit was conducted in person.  BP 126/64   Pulse 66   Temp 98 F (36.7 C) (Temporal)   Ht 5' 3.5" (1.613 m)   Wt 177 lb 4 oz (80.4 kg)   SpO2 97%   BMI 30.91 kg/m    CC: CPE Subjective:   HPI: Jennifer Pineda is a 71 y.o. female presenting on 03/22/2022 for Annual Exam [facility] prt 2. Needs refill for Breo. C/o continuous pain in L index finger. )   Saw health advisor yesterday for medicare wellness visit. Note reviewed. Declined cognitive testing.  No results found.  Flowsheet Row Clinical Support from 03/21/2022 in [facility]  PHQ-2 Total Score 0          03/21/2022    1:24 PM 03/20/2021    9:14 AM 03/13/2020   10:43 AM 02/18/2019   12:40 PM 11/11/2017    1:01 PM  Fall Risk   Falls in the past year? 0 1 0 0 No  Number falls in past yr: 0 0 0    Injury with Fall? 0 0 0    Risk for fall due to : No Fall Risks  Medication side effect    Follow up Falls evaluation completed  Falls evaluation completed;Falls prevention discussed      Not sleeping well due to new puppies at home.   H/o bladder cancer s/p TURBT 07/2015. Sees urology yearly [name]) with yearly surveillance cystoscopy, last seen 04/2021.    OSA on CPAP - trouble with loose mask straps, previously followed by Dr Sung Amabile. Due for pulm f/u. Chronic cough managed with breo inhaler.    DM - metformin XR - doing well with this, tolerating better than IR formulation.   Chest pain ongoing for 2 months - comes on when she gets upset, as she worries about alcoholic son. Chest pain described as pressure tightness, improved with sitting and resting. No associated diaphoresis, dyspnea, arm or jaw pain. No pain with exertion (ie push mowing lawn).  Strong fmhx CAD/MI (sister, brother, father, grandparents). Sister just had 4v bypass 12/2021.   Preventative: COLONOSCOPY Date: 09/2012 small int hem [name]) rpt  10 yrs. Desires Cologuard.  Well woman - last pap was 2010, all normal. Hysterectomy age 28yo for endometriosis/pelvic pain. Ovaries remain.  Mammogram - 08/2021 Birads1 @ [facility] DEXA 06/2014 - WNL Lung cancer screening - not eligible Flu - yearly COVID vaccine - Moderna 10/2019, 11/2019, no booster Prevnar-13 2015, pneumovax23 08/2015 Td 2005, Tdap 2015 zostavax - 2015  shingrix - 03/2021, 07/2021 Advanced directives - in chart 08/2015. Would want son [name]) to be HCPOA. No prolonged life support if terminal condition. Full code, ok with temporary ventilation.  Seat belt use discussed Sunscreen use discussed. No changing moles on skin. Saw derm s/p BCC removed 2021  Sleep - averaging 6 hours/night due to dogs waking her up Ex smoker - quit 1988 Alcohol - 1-2 glasses wine per week  Eye exam yearly - macular degeneration  Dentist q6 mo  Bowel - no constipation  Bladder - some uirge incontinence, nocturia x3-4   Daughter [name] lives with her  Children: 2 children, one daughter lives with mother, son lives with father Activity: 20 min walk daily Diet: good water, fruits/vegetables daily.      Relevant past medical, surgical, family and social history reviewed and updated as  indicated. Interim medical history since our last visit reviewed. Allergies and medications reviewed and updated. Outpatient Medications Prior to Visit  Medication Sig Dispense Refill   albuterol (VENTOLIN HFA) 108 (90 Base) MCG/ACT inhaler Inhale 2 puffs into the lungs every 6 (six) hours as needed. For shortness of breath. 18 g 6   Blood Glucose Monitoring Suppl (ONE TOUCH ULTRA SYSTEM KIT) w/Device KIT 1 kit by Does not apply route once. 1 each 0   fluticasone (CUTIVATE) 0.05 % cream Apply topically. For lips     glucose blood (ONE TOUCH ULTRA TEST) test strip 1 each by Other route daily. Use to check sugar once daily and as needed. Dx: E11.9 100 each 3   loratadine (CLARITIN) 10 MG tablet TAKE  1 TABLET BY MOUTH ONCE DAILY 90 tablet 3   Multiple Vitamins-Calcium (VIACTIV MULTI-VITAMIN) CHEW Chew 1 tablet by mouth daily.     Multiple Vitamins-Minerals (PRESERVISION AREDS PO) Take by mouth at bedtime.     ONETOUCH DELICA LANCETS FINE MISC Use as directed - check as needed or 3 times weekly 100 each 1   amLODipine (NORVASC) 10 MG tablet Take 1 tablet (10 mg total) by mouth daily. 90 tablet 3   atorvastatin (LIPITOR) 10 MG tablet Take 1 tablet (10 mg total) by mouth daily. 90 tablet 3   FLUoxetine (PROZAC) 10 MG capsule Take 1 capsule (10 mg total) by mouth daily. 90 capsule 3   fluticasone (FLONASE) 50 MCG/ACT nasal spray Use 2 spray(s) in each nostril once daily 48 g 3   fluticasone furoate-vilanterol (BREO ELLIPTA) 100-25 MCG/INH AEPB Inhale 1 puff into the lungs daily. 90 each 3   lisinopril (ZESTRIL) 10 MG tablet Take 1 tablet (10 mg total) by mouth daily. 90 tablet 3   metFORMIN (GLUCOPHAGE XR) 500 MG 24 hr tablet Take 1 tablet (500 mg total) by mouth daily with breakfast. 90 tablet 3   omeprazole (PRILOSEC) 40 MG capsule Take 1 capsule (40 mg total) by mouth daily. 90 capsule 3   triamterene-hydrochlorothiazide (MAXZIDE-25) 37.5-25 MG tablet Take 1 tablet by mouth daily. 90 tablet 3   Influenza vac split quadrivalent PF (FLUZONE HIGH-DOSE) 0.5 ML injection      hydrocortisone 2.5 % ointment      lisinopril (ZESTRIL) 5 MG tablet Take 1 tablet by mouth daily. (Patient not taking: Reported on 03/22/2022)     meloxicam (MOBIC) 15 MG tablet Take 1 tablet every day by oral route. (Patient not taking: Reported on 03/21/2022)     ofloxacin (FLOXIN) 0.3 % OTIC solution      phenazopyridine (PYRIDIUM) 200 MG tablet Take 1 tablet by mouth 3 (three) times daily. (Patient not taking: Reported on 03/21/2022)     tiotropium (SPIRIVA HANDIHALER) 18 MCG inhalation capsule      No facility-administered medications prior to visit.     Per HPI unless specifically indicated in ROS section below Review  of Systems  Constitutional:  Negative for activity change, appetite change, chills, fatigue, fever and unexpected weight change.  HENT:  Negative for hearing loss.   Eyes:  Negative for visual disturbance.  Respiratory:  Negative for cough, chest tightness, shortness of breath and wheezing.   Cardiovascular:  Positive for chest pain. Negative for palpitations and leg swelling.  Gastrointestinal:  Negative for abdominal distention, abdominal pain, blood in stool, constipation, diarrhea, nausea and vomiting.  Genitourinary:  Negative for difficulty urinating and hematuria.  Musculoskeletal:  Negative for arthralgias, myalgias and neck pain.  Skin:  Negative  for rash.  Neurological:  Negative for dizziness, seizures, syncope and headaches.  Hematological:  Negative for adenopathy. Bruises/bleeds easily.  Psychiatric/Behavioral:  Positive for dysphoric mood. The patient is not nervous/anxious.     Objective:  BP 126/64   Pulse 66   Temp 98 F (36.7 C) (Temporal)   Ht 5' 3.5" (1.613 m)   Wt 177 lb 4 oz (80.4 kg)   SpO2 97%   BMI 30.91 kg/m   Wt Readings from Last 3 Encounters:  03/22/22 177 lb 4 oz (80.4 kg)  03/21/22 179 lb (81.2 kg)  11/28/21 179 lb 6 oz (81.4 kg)      Physical Exam Vitals and nursing note reviewed.  Constitutional:      Appearance: Normal appearance. She is not ill-appearing.  HENT:     Head: Normocephalic and atraumatic.     Right Ear: Tympanic membrane, ear canal and external ear normal. There is no impacted cerumen.     Left Ear: Tympanic membrane, ear canal and external ear normal. There is no impacted cerumen.  Eyes:     General:        Right eye: No discharge.        Left eye: No discharge.     Extraocular Movements: Extraocular movements intact.     Conjunctiva/sclera: Conjunctivae normal.     Pupils: Pupils are equal, round, and reactive to light.  Neck:     Thyroid: No thyroid mass or thyromegaly.     Vascular: No carotid bruit.   Cardiovascular:     Rate and Rhythm: Normal rate and regular rhythm.     Pulses: Normal pulses.     Heart sounds: Murmur (2/6 systolic) heard.  Pulmonary:     Effort: Pulmonary effort is normal. No respiratory distress.     Breath sounds: Normal breath sounds. No wheezing, rhonchi or rales.  Abdominal:     General: Bowel sounds are normal. There is no distension.     Palpations: Abdomen is soft. There is no mass.     Tenderness: There is no abdominal tenderness. There is no guarding or rebound.     Hernia: No hernia is present.  Musculoskeletal:     Cervical back: Normal range of motion and neck supple. No rigidity.     Right lower leg: No edema.     Left lower leg: No edema.  Lymphadenopathy:     Cervical: No cervical adenopathy.  Skin:    General: Skin is warm and dry.     Findings: No rash.  Neurological:     General: No focal deficit present.     Mental Status: She is alert. Mental status is at baseline.  Psychiatric:        Mood and Affect: Mood normal.        Behavior: Behavior normal.       Results for orders placed or performed in visit on 03/15/22  Microalbumin / creatinine urine ratio  Result Value Ref Range   Microalb, Ur 1.4 0.0 - 1.9 mg/dL   Creatinine,U 131.7 mg/dL   Microalb Creat Ratio 1.1 0.0 - 30.0 mg/g  Hemoglobin A1c  Result Value Ref Range   Hgb A1c MFr Bld 6.2 4.6 - 6.5 %  Comprehensive metabolic panel  Result Value Ref Range   Sodium 138 135 - 145 mEq/L   Potassium 3.9 3.5 - 5.1 mEq/L   Chloride 99 96 - 112 mEq/L   CO2 28 19 - 32 mEq/L   Glucose, Bld 166 (H)  70 - 99 mg/dL   BUN 16 6 - 23 mg/dL   Creatinine, Ser 0.76 0.40 - 1.20 mg/dL   Total Bilirubin 0.4 0.2 - 1.2 mg/dL   Alkaline Phosphatase 68 39 - 117 U/L   AST 11 0 - 37 U/L   ALT 9 0 - 35 U/L   Total Protein 7.4 6.0 - 8.3 g/dL   Albumin 4.2 3.5 - 5.2 g/dL   GFR 78.02 >60.00 mL/min   Calcium 9.1 8.4 - 10.5 mg/dL  Lipid panel  Result Value Ref Range   Cholesterol 166 0 - 200 mg/dL    Triglycerides 182.0 (H) 0.0 - 149.0 mg/dL   HDL 48.40 >39.00 mg/dL   VLDL 36.4 0.0 - 40.0 mg/dL   LDL Cholesterol 81 0 - 99 mg/dL   Total CHOL/HDL Ratio 3    NonHDL 117.43    EKG - NSR rate 60, ?p mitrale, normal axis, intervals, no hypertrophy, q waves anteroseptally with poor R wave progression, but largely unchanged from EKG 2019  Assessment & Plan:   Problem List Items Addressed This Visit     Encounter for general adult medical examination with abnormal findings - Primary (Chronic)    Preventative protocols reviewed and updated unless pt declined. Discussed healthy diet and lifestyle.       Hyperlipidemia associated with type 2 diabetes mellitus (HCC)    Chronic, adequate on low dose lipitor. With concerning chest pains, along with strong fmhx of CAD, will increase dose to $Remov'40mg'ueiFdC$  daily. The 10-year ASCVD risk score (Arnett DK, et al., 2019) is: 26.5%   Values used to calculate the score:     Age: 2 years     Sex: Female     Is Non-Hispanic African American: No     Diabetic: Yes     Tobacco smoker: No     Systolic Blood Pressure: 916 mmHg     Is BP treated: Yes     HDL Cholesterol: 48.4 mg/dL     Total Cholesterol: 166 mg/dL       Relevant Medications   amLODipine (NORVASC) 10 MG tablet   atorvastatin (LIPITOR) 40 MG tablet   lisinopril (ZESTRIL) 10 MG tablet   metFORMIN (GLUCOPHAGE XR) 500 MG 24 hr tablet   triamterene-hydrochlorothiazide (MAXZIDE-25) 37.5-25 MG tablet   aspirin EC 81 MG tablet   Other Relevant Orders   Ambulatory referral to Cardiology   ANXIETY DEPRESSION    Discussed recent increased stress with son's drinking.  She feels stable on low dose prozac - continue this.       Relevant Medications   FLUoxetine (PROZAC) 10 MG capsule   Obstructive sleep apnea    Continues CPAP use.  Last saw pulm 2019 - consider return.       Essential hypertension    Chronic, stable on current regimen.       Relevant Medications   amLODipine (NORVASC) 10  MG tablet   atorvastatin (LIPITOR) 40 MG tablet   lisinopril (ZESTRIL) 10 MG tablet   triamterene-hydrochlorothiazide (MAXZIDE-25) 37.5-25 MG tablet   aspirin EC 81 MG tablet   Other Relevant Orders   Ambulatory referral to Cardiology   GERD    Stable on daily PPI.       Relevant Medications   omeprazole (PRILOSEC) 40 MG capsule   Controlled type 2 diabetes mellitus without complication, without long-term current use of insulin (HCC)    Chronic, great control on metformin XR $RemoveBefo'500mg'ZYOzaGonMHV$  daily - continue this.  Relevant Medications   atorvastatin (LIPITOR) 40 MG tablet   lisinopril (ZESTRIL) 10 MG tablet   metFORMIN (GLUCOPHAGE XR) 500 MG 24 hr tablet   aspirin EC 81 MG tablet   Other Relevant Orders   Ambulatory referral to Cardiology   Persistent asthma without complication    Chronic, stable period on daily breo - refilled today.       Relevant Medications   fluticasone furoate-vilanterol (BREO ELLIPTA) 100-25 MCG/ACT AEPB   COPD, mild (HCC)    Continue daily breo with albuterol prn       Relevant Medications   fluticasone (FLONASE) 50 MCG/ACT nasal spray   fluticasone furoate-vilanterol (BREO ELLIPTA) 100-25 MCG/ACT AEPB   History of bladder cancer    Continues yearly f/u with uro ([facility])      Obesity, Class I, BMI 30-34.9    Encouraged ongoing weight loss efforts through healthy diet and regular exercise.       Chest tightness    Concerning description of chest discomfort associated with high stress period. Strong fmhx CAD, has significant risk factors for CAD (HTN, HLD, DM). Will start aspirin $RemoveBefor'81mg'lpLDSgkoSuNP$  daily, increase lipitor to $RemoveBe'40mg'tXHUoSIov$  daily, update EKG, and expedite referral to cardiology. ER/911 precautions reviewed. Pt agrees with plan.       Relevant Orders   Ambulatory referral to Cardiology   EKG 12-Lead (Completed)     Meds ordered this encounter  Medications   amLODipine (NORVASC) 10 MG tablet    Sig: Take 1 tablet (10 mg total) by mouth daily.     Dispense:  90 tablet    Refill:  3   atorvastatin (LIPITOR) 40 MG tablet    Sig: Take 1 tablet (40 mg total) by mouth daily.    Dispense:  90 tablet    Refill:  3    Note new dose   FLUoxetine (PROZAC) 10 MG capsule    Sig: Take 1 capsule (10 mg total) by mouth daily.    Dispense:  90 capsule    Refill:  3   fluticasone (FLONASE) 50 MCG/ACT nasal spray    Sig: Place 2 sprays into both nostrils daily.    Dispense:  48 g    Refill:  3   lisinopril (ZESTRIL) 10 MG tablet    Sig: Take 1 tablet (10 mg total) by mouth daily.    Dispense:  90 tablet    Refill:  3   metFORMIN (GLUCOPHAGE XR) 500 MG 24 hr tablet    Sig: Take 1 tablet (500 mg total) by mouth daily with breakfast.    Dispense:  90 tablet    Refill:  3   omeprazole (PRILOSEC) 40 MG capsule    Sig: Take 1 capsule (40 mg total) by mouth daily.    Dispense:  90 capsule    Refill:  3   triamterene-hydrochlorothiazide (MAXZIDE-25) 37.5-25 MG tablet    Sig: Take 1 tablet by mouth daily.    Dispense:  90 tablet    Refill:  3   aspirin EC 81 MG tablet    Sig: Take 1 tablet (81 mg total) by mouth daily. Swallow whole.   fluticasone furoate-vilanterol (BREO ELLIPTA) 100-25 MCG/ACT AEPB    Sig: Inhale 1 puff into the lungs daily.    Dispense:  90 each    Refill:  3   Orders Placed This Encounter  Procedures   Ambulatory referral to Cardiology    Referral Priority:   Urgent    Referral Type:   Consultation  Referral Reason:   Specialty Services Required    Requested Specialty:   Cardiology    Number of Visits Requested:   1   EKG 12-Lead    Patient instructions: Contact us in 6 months and we will order Cologuard for colon cancer screening.  Call and schedule diabetic eye exam.  For chest pain - start aspirin $RemoveBefor'81mg'iPgKawtwQchA$  daily. Increase lipitor to $RemoveBe'40mg'OuiEIDngt$  daily for now, until we see heart doctor (new dose at pharmacy). We will refer you to cardiology. If recurrent chest pain that's not improving, go to ER.  EKG today  Return in  6 months for diabetes follow up visit.  Good to see you today  Follow up plan: Return in about 6 months (around 09/22/2022), or if symptoms worsen or fail to improve, for follow up visit.  Ria Bush, MD

## 2022-03-22 NOTE — Assessment & Plan Note (Signed)
Stable on daily PPI.

## 2022-03-23 ENCOUNTER — Encounter: Payer: Self-pay | Admitting: Family Medicine

## 2022-03-27 NOTE — Progress Notes (Unsigned)
Cardiology Office Note   Date:  03/28/2022   ID:  Jennifer Pineda, DOB [birth year], MRN [id number]  PCP:  Ria Bush, MD    No chief complaint on file.  Chest pain  Wt Readings from Last 3 Encounters:  03/28/22 178 lb 9.6 oz (81 kg)  03/22/22 177 lb 4 oz (80.4 kg)  03/21/22 179 lb (81.2 kg)       History of Present Illness: Jennifer Pineda is a 71 y.o. female who is being seen today for the evaluation of chest tightness at the request of Ria Bush, MD.   She has a h/o HTN, OSA, DM2. Strong family h/o CAD.  Siblings with CAD, CABG.  Her son had been drinking and got "unruly"  in May, July 2023.  She got anxious.  Chest pressure during the stressful episode this.  Lasts until she calms down.  Has had episodes for days.   Sister had CABG in 5/23 and she had similar sx with the anxiety.  Sister is doing well.   Quit smoking in 1988.  Denies : exertional Chest pain. Dizziness. Leg edema. Nitroglycerin use. Orthopnea. Palpitations. Paroxysmal nocturnal dyspnea. Shortness of breath. Syncope.    She does walk with her dogs and does housework.   Past Medical History:  Diagnosis Date   Allergic rhinitis    Anxiety and depression    Arthritis    hands   Asthma, persistent    Basal cell carcinoma 12/2019   s/p excision, margins clear (Dr Pearline Cables)   CAD (coronary artery disease)    per CT [id number]   Complication of anesthesia    slow to wake   COPD, mild ([place])    pulmologist--  dr Vilinda Boehringer ([facility] in [place])   COVID-19 virus infection 09/24/2019   Diet-controlled diabetes mellitus ([place]) 05/01/2011   Completed DSME 10/2015    Diverticulosis of colon    Essential hypertension    GERD (gastroesophageal reflux disease)    History of cellulitis    Nov 2015-- nasal cellulitis--  resolved   History of endometriosis    History of hiatal hernia    Hyperlipidemia    OSA on CPAP    severe per study 05-09-2010   PONV (postoperative nausea and  vomiting)    Shingles 12/29/2006   Sleep apnea    Urothelial carcinoma ([place]) 2016   superficial papillary L lateral bladder wall s/p BCG therapy ([place])    Past Surgical History:  Procedure Laterality Date   ABDOMINAL HYSTERECTOMY  1983   and Appendectomy   CARDIOVASCULAR STRESS TEST  10-06-1998   no evidence ischemia or scar/  normal LV function and wall motion, ef 65%   COLONOSCOPY  09/2012   small int hem Carlean Purl) rpt 10 yrs   CYSTOSCOPY WITH BIOPSY N/A 08/15/2015   Procedure: CYSTOSCOPY WITH BLADDER RESECTION;  Surgeon: Festus Aloe, MD;  Location: [facility];  Service: Urology;  Laterality: N/A;   ESOPHAGOGASTRODUODENOSCOPY  last one 08/ 2001   LUMBAR LAMINECTOMY/DECOMPRESSION MICRODISCECTOMY  10/ 2013   SEPTOPLASTY  03/  2002   SKIN SURGERY  01/12/2020   mole removed from back   TRANSURETHRAL RESECTION OF BLADDER TUMOR N/A 07/11/2015   Procedure: TRANSURETHRAL RESECTION OF BLADDER TUMOR (TURBT);  Surgeon: Festus Aloe, MD;  Location: [facility];  Service: Urology;  Laterality: N/A;   TRANSURETHRAL RESECTION OF BLADDER TUMOR N/A 08/15/2015   Procedure: TRANSURETHRAL RESECTION OF BLADDER TUMOR (TURBT);  Surgeon: Festus Aloe, MD;  Location: [facility];  Service: Urology;  Laterality: N/A;   TRIGGER FINGER RELEASE Right 2014     Current Outpatient Medications  Medication Sig Dispense Refill   albuterol (VENTOLIN HFA) 108 (90 Base) MCG/ACT inhaler Inhale 2 puffs into the lungs every 6 (six) hours as needed. For shortness of breath. 18 g 6   amLODipine (NORVASC) 10 MG tablet Take 1 tablet (10 mg total) by mouth daily. 90 tablet 3   aspirin EC 81 MG tablet Take 1 tablet (81 mg total) by mouth daily. Swallow whole.     atorvastatin (LIPITOR) 40 MG tablet Take 1 tablet (40 mg total) by mouth daily. 90 tablet 3   Blood Glucose Monitoring Suppl (ONE TOUCH ULTRA SYSTEM KIT) w/Device KIT 1 kit by Does not apply route once. 1  each 0   FLUoxetine (PROZAC) 10 MG capsule Take 1 capsule (10 mg total) by mouth daily. 90 capsule 3   fluticasone (CUTIVATE) 0.05 % cream Apply topically. For lips     fluticasone (FLONASE) 50 MCG/ACT nasal spray Place 2 sprays into both nostrils daily. 48 g 3   fluticasone furoate-vilanterol (BREO ELLIPTA) 100-25 MCG/ACT AEPB Inhale 1 puff into the lungs daily. 90 each 3   glucose blood (ONE TOUCH ULTRA TEST) test strip 1 each by Other route daily. Use to check sugar once daily and as needed. Dx: E11.9 100 each 3   Influenza vac split quadrivalent PF (FLUZONE HIGH-DOSE) 0.5 ML injection      lisinopril (ZESTRIL) 10 MG tablet Take 1 tablet (10 mg total) by mouth daily. 90 tablet 3   loratadine (CLARITIN) 10 MG tablet TAKE 1 TABLET BY MOUTH ONCE DAILY 90 tablet 3   metFORMIN (GLUCOPHAGE XR) 500 MG 24 hr tablet Take 1 tablet (500 mg total) by mouth daily with breakfast. 90 tablet 3   Multiple Vitamins-Calcium (VIACTIV MULTI-VITAMIN) CHEW Chew 1 tablet by mouth daily.     Multiple Vitamins-Minerals (PRESERVISION AREDS PO) Take by mouth at bedtime.     omeprazole (PRILOSEC) 40 MG capsule Take 1 capsule (40 mg total) by mouth daily. 90 capsule 3   ONETOUCH DELICA LANCETS FINE MISC Use as directed - check as needed or 3 times weekly 100 each 1   triamterene-hydrochlorothiazide (MAXZIDE-25) 37.5-25 MG tablet Take 1 tablet by mouth daily. 90 tablet 3   No current facility-administered medications for this visit.    Allergies:   Levofloxacin, Doxycycline hyclate, Metoprolol succinate, Other, Tetracycline hcl, and Adhesive [tape]    Social History:  The patient  reports that she quit smoking about 35 years ago. Her smoking use included cigarettes. She has a 38.00 pack-year smoking history. She has never used smokeless tobacco. She reports current alcohol use. She reports that she does not use drugs.   Family History:  The patient's family history includes Alcohol abuse in her brother; Aneurysm in her  father; Cancer in her mother; Cancer (age of onset: 110) in her brother; Diabetes in her father and sister; Heart disease in her father; Heart disease (age of onset: 42) in her brother; Hyperlipidemia in her sister; Hypertension in her brother and brother; Kidney disease in her mother.    ROS:  Please see the history of present illness.   Otherwise, review of systems are positive for chest pressure.   All other systems are reviewed and negative.    PHYSICAL EXAM: VS:  BP 134/60   Pulse 66   Ht 5' 3.5" (1.613 m)   Wt 178 lb  9.6 oz (81 kg)   SpO2 99%   BMI 31.14 kg/m  , BMI Body mass index is 31.14 kg/m. GEN: Well nourished, well developed, in no acute distress HEENT: normal Neck: no JVD, carotid bruits, or masses Cardiac: RRR; no murmurs, rubs, or gallops,no edema  Respiratory:  clear to auscultation bilaterally, normal work of breathing GI: soft, nontender, nondistended, + BS MS: no deformity or atrophy Skin: warm and dry, no rash Neuro:  Strength and sensation are intact Psych: euthymic mood, full affect   EKG:   The ekg ordered today demonstrates NSR, septal Q waves, no ST changes   Recent Labs: 03/15/2022: ALT 9; BUN 16; Creatinine, Ser 0.76; Potassium 3.9; Sodium 138   Lipid Panel    Component Value Date/Time   CHOL 166 03/15/2022 0808   TRIG 182.0 (H) 03/15/2022 0808   HDL 48.40 03/15/2022 0808   CHOLHDL 3 03/15/2022 0808   VLDL 36.4 03/15/2022 0808   LDLCALC 81 03/15/2022 0808     Other studies Reviewed: Additional studies/ records that were reviewed today with results demonstrating: labs reviewed.   ASSESSMENT AND PLAN:  Chest pain precordial: located in center of her chest, a pressure.   Given her strong family history of heart disease, ischemic evaluation is warranted.  We will plan for CTA coronaries to evaluate for obstructive coronary artery disease.  Also, calcium score would be helpful to further manage her risk factors.  Heart rate in the low 60s on  ECG.  Metoprolol 50 mg prior to CTA. HTN: Target blood pressure less than 130/80 given her diabetes. DM: Whole food, plant-based diet,  Avoid processed foods. High-fiber diet. Hyperlipidemia: Continue atorvastatin 40 mg daily.  LDL 81. Former smoker: She quit smoking many years ago.   Current medicines are reviewed at length with the patient today.  The patient concerns regarding her medicines were addressed.  The following changes have been made:  No change  Labs/ tests ordered today include:  No orders of the defined types were placed in this encounter.   Recommend 150 minutes/week of aerobic exercise Low fat, low carb, high fiber diet recommended  Disposition:   FU in for CTA   Signed, Larae Grooms, MD  03/28/2022 9:28 AM    [facility] [place], [place], [place]  [address] Phone: [phone]; Fax: [phone]

## 2022-03-28 ENCOUNTER — Ambulatory Visit: Payer: PPO | Admitting: Interventional Cardiology

## 2022-03-28 ENCOUNTER — Encounter: Payer: Self-pay | Admitting: Interventional Cardiology

## 2022-03-28 VITALS — BP 134/60 | HR 66 | Ht 63.5 in | Wt 178.6 lb

## 2022-03-28 DIAGNOSIS — R072 Precordial pain: Secondary | ICD-10-CM

## 2022-03-28 DIAGNOSIS — Z87891 Personal history of nicotine dependence: Secondary | ICD-10-CM | POA: Diagnosis not present

## 2022-03-28 DIAGNOSIS — E119 Type 2 diabetes mellitus without complications: Secondary | ICD-10-CM

## 2022-03-28 DIAGNOSIS — I10 Essential (primary) hypertension: Secondary | ICD-10-CM | POA: Diagnosis not present

## 2022-03-28 DIAGNOSIS — E782 Mixed hyperlipidemia: Secondary | ICD-10-CM | POA: Diagnosis not present

## 2022-03-28 MED ORDER — METOPROLOL TARTRATE 50 MG PO TABS: ORAL_TABLET | ORAL | 0 refills | Status: DC

## 2022-03-28 NOTE — Patient Instructions (Signed)
Medication Instructions:  Your physician recommends that you continue on your current medications as directed. Please refer to the Current Medication list given to you today.  *If you need a refill on your cardiac medications before your next appointment, please call your pharmacy*   Lab Work: none If you have labs (blood work) drawn today and your tests are completely normal, you will receive your results only by: Jennifer Pineda (if you have MyChart) OR A paper copy in the mail If you have any lab test that is abnormal or we need to change your treatment, we will call you to review the results.   Testing/Procedures: Your physician has requested that you have cardiac CT. Cardiac computed tomography (CT) is a painless test that uses an x-ray machine to take clear, detailed pictures of your heart. For further information please visit [email or web address]. Please follow instruction sheet as given.     Follow-Up: At [facility], you and your health needs are our priority.  As part of our continuing mission to provide you with exceptional heart care, we have created designated Provider Care Teams.  These Care Teams include your primary Cardiologist (physician) and Advanced Practice Providers (APPs -  Physician Assistants and Nurse Practitioners) who all work together to provide you with the care you need, when you need it.  We recommend signing up for the patient portal called "MyChart".  Sign up information is provided on this After Visit Summary.  MyChart is used to connect with patients for Virtual Visits (Telemedicine).  Patients are able to view lab/test results, encounter notes, upcoming appointments, etc.  Non-urgent messages can be sent to your provider as well.   To learn more about what you can do with MyChart, go to [email or web address].    Your next appointment:   Based on test results  The format for your next appointment:   In Person  Provider:   Larae Grooms, MD      Other Instructions   Your cardiac CT will be scheduled at one of the below locations:   [facility] [address] [place], [place] [address] [phone]  [facility] [address] [place], [place] [address] [phone]  If scheduled at [facility], please arrive at the [facility] and Children's Entrance (Entrance C2) of [facility] 30 minutes prior to test start time. You can use the FREE valet parking offered at entrance C (encouraged to control the heart rate for the test)  Proceed to the [facility] Radiology Department (first floor) to check-in and test prep.  All radiology patients and guests should use entrance C2 at [facility], accessed from [facility], even though the hospital's physical address listed is [address].    If scheduled at [facility], please arrive 15 mins early for check-in and test prep.  Please follow these instructions carefully (unless otherwise directed):  Hold all erectile dysfunction medications at least 3 days (72 hrs) prior to test.  On the Night Before the Test: Be sure to Drink plenty of water. Do not consume any caffeinated/decaffeinated beverages or chocolate 12 hours prior to your test. Do not take any antihistamines 12 hours prior to your test.    On the Day of the Test: Drink plenty of water until 1 hour prior to the test. Do not eat any food 4 hours prior to the test. You may take your regular medications prior to the test.  Take metoprolol (Lopressor) two hours prior to test. HOLD Furosemide/Hydrochlorothiazide morning of the test. FEMALES- please wear underwire-free bra if available, avoid dresses & tight clothing        After the Test: Drink plenty of water. After receiving IV contrast, you may experience a mild flushed feeling. This is normal. On occasion, you may experience a  mild rash up to 24 hours after the test. This is not dangerous. If this occurs, you can take Benadryl 25 mg and increase your fluid intake. If you experience trouble breathing, this can be serious. If it is severe call 911 IMMEDIATELY. If it is mild, please call our office. If you take any of these medications: Glipizide/Metformin, Avandament, Glucavance, please do not take 48 hours after completing test unless otherwise instructed.  We will call to schedule your test 2-4 weeks out understanding that some insurance companies will need an authorization prior to the service being performed.   For non-scheduling related questions, please contact the cardiac imaging nurse navigator should you have any questions/concerns: Marchia Bond, Cardiac Imaging Nurse Navigator Gordy Clement, Cardiac Imaging Nurse Navigator [facility] Direct Office Dial: [phone]   For scheduling needs, including cancellations and rescheduling, please call [place], [phone].    Important Information About Sugar

## 2022-04-11 DIAGNOSIS — H353131 Nonexudative age-related macular degeneration, bilateral, early dry stage: Secondary | ICD-10-CM | POA: Diagnosis not present

## 2022-04-11 LAB — HM DIABETES EYE EXAM

## 2022-04-15 ENCOUNTER — Telehealth (HOSPITAL_COMMUNITY): Payer: Self-pay | Admitting: Emergency Medicine

## 2022-04-15 NOTE — Telephone Encounter (Signed)
Reaching out to patient to offer assistance regarding upcoming cardiac imaging study; pt verbalizes understanding of appt date/time, parking situation and where to check in, pre-test NPO status and medications ordered, and verified current allergies; name and call back number provided for further questions should they arise Jennifer Bond RN Navigator Cardiac Imaging [facility] [phone] office [phone] cell  L arm better than R Arrival 1200 w/c entrnace '50mg'$  metoprolol Holding allergy meds, diuretics, diabetes meds

## 2022-04-15 NOTE — Telephone Encounter (Signed)
Attempted to call patient regarding upcoming cardiac CT appointment. °Left message on voicemail with name and callback number °Mardella Nuckles RN Navigator Cardiac Imaging °[facility] °[phone] Office °[phone] Cell ° °

## 2022-04-16 ENCOUNTER — Other Ambulatory Visit: Payer: Self-pay | Admitting: Interventional Cardiology

## 2022-04-16 ENCOUNTER — Ambulatory Visit (HOSPITAL_COMMUNITY)
Admission: RE | Admit: 2022-04-16 | Discharge: 2022-04-16 | Disposition: A | Discharge status: Home / Self Care | Payer: PPO | Source: Ambulatory Visit | Attending: Interventional Cardiology | Admitting: Interventional Cardiology

## 2022-04-16 ENCOUNTER — Telehealth (HOSPITAL_COMMUNITY): Payer: Self-pay | Admitting: Emergency Medicine

## 2022-04-16 ENCOUNTER — Other Ambulatory Visit (HOSPITAL_COMMUNITY): Payer: Self-pay | Admitting: Interventional Cardiology

## 2022-04-16 DIAGNOSIS — R072 Precordial pain: Secondary | ICD-10-CM

## 2022-04-16 MED ORDER — NITROGLYCERIN 0.4 MG SL SUBL
0.8000 mg | SUBLINGUAL_TABLET | Freq: Once | SUBLINGUAL | Status: AC
  Administered 2022-04-16: 0.8 mg via SUBLINGUAL

## 2022-04-16 MED ORDER — IOHEXOL 350 MG/ML SOLN
100.0000 mL | Freq: Once | INTRAVENOUS | Status: AC | PRN
  Administered 2022-04-16: 100 mL via INTRAVENOUS

## 2022-04-16 MED ORDER — NITROGLYCERIN 0.4 MG SL SUBL
SUBLINGUAL_TABLET | SUBLINGUAL | Status: AC
  Filled 2022-04-16: qty 2

## 2022-04-16 MED ORDER — METOPROLOL TARTRATE 50 MG PO TABS: ORAL_TABLET | ORAL | 0 refills | Status: DC

## 2022-04-16 NOTE — Telephone Encounter (Signed)
Resending metop for patients repeat exam on 8/24 Marchia Bond RN Navigator Cardiac Imaging [facility] [phone] Office  [phone] Cell

## 2022-04-18 ENCOUNTER — Other Ambulatory Visit: Payer: Self-pay | Admitting: Cardiovascular Disease

## 2022-04-18 ENCOUNTER — Ambulatory Visit (HOSPITAL_COMMUNITY)
Admission: RE | Admit: 2022-04-18 | Discharge: 2022-04-18 | Disposition: A | Discharge status: Home / Self Care | Payer: PPO | Source: Ambulatory Visit | Attending: Interventional Cardiology | Admitting: Interventional Cardiology

## 2022-04-18 ENCOUNTER — Ambulatory Visit (HOSPITAL_BASED_OUTPATIENT_CLINIC_OR_DEPARTMENT_OTHER)
Admission: RE | Admit: 2022-04-18 | Discharge: 2022-04-18 | Disposition: A | Discharge status: Home / Self Care | Payer: PPO | Source: Ambulatory Visit | Attending: Cardiovascular Disease | Admitting: Cardiovascular Disease

## 2022-04-18 ENCOUNTER — Ambulatory Visit (HOSPITAL_COMMUNITY)
Admission: RE | Admit: 2022-04-18 | Discharge: 2022-04-18 | Disposition: A | Discharge status: Home / Self Care | Payer: PPO | Source: Ambulatory Visit | Attending: Cardiovascular Disease | Admitting: Cardiovascular Disease

## 2022-04-18 DIAGNOSIS — R931 Abnormal findings on diagnostic imaging of heart and coronary circulation: Secondary | ICD-10-CM | POA: Diagnosis not present

## 2022-04-18 DIAGNOSIS — I2510 Atherosclerotic heart disease of native coronary artery without angina pectoris: Secondary | ICD-10-CM | POA: Diagnosis not present

## 2022-04-18 DIAGNOSIS — R072 Precordial pain: Secondary | ICD-10-CM | POA: Insufficient documentation

## 2022-04-18 MED ORDER — NITROGLYCERIN 0.4 MG SL SUBL
0.8000 mg | SUBLINGUAL_TABLET | Freq: Once | SUBLINGUAL | Status: AC
  Administered 2022-04-18: 0.8 mg via SUBLINGUAL

## 2022-04-18 MED ORDER — NITROGLYCERIN 0.4 MG SL SUBL
SUBLINGUAL_TABLET | SUBLINGUAL | Status: AC
  Filled 2022-04-18: qty 2

## 2022-04-18 MED ORDER — IOHEXOL 350 MG/ML SOLN
100.0000 mL | Freq: Once | INTRAVENOUS | Status: AC | PRN
  Administered 2022-04-18: 100 mL via INTRAVENOUS

## 2022-04-22 ENCOUNTER — Other Ambulatory Visit: Payer: Self-pay | Admitting: Family Medicine

## 2022-04-24 ENCOUNTER — Encounter: Payer: Self-pay | Admitting: Family Medicine

## 2022-05-09 DIAGNOSIS — Z8551 Personal history of malignant neoplasm of bladder: Secondary | ICD-10-CM | POA: Diagnosis not present

## 2022-05-09 DIAGNOSIS — R351 Nocturia: Secondary | ICD-10-CM | POA: Diagnosis not present

## 2022-06-21 ENCOUNTER — Other Ambulatory Visit: Payer: Self-pay | Admitting: Family Medicine

## 2022-06-21 DIAGNOSIS — J3089 Other allergic rhinitis: Secondary | ICD-10-CM

## 2022-08-12 ENCOUNTER — Telehealth: Payer: Self-pay

## 2022-08-12 ENCOUNTER — Ambulatory Visit (INDEPENDENT_AMBULATORY_CARE_PROVIDER_SITE_OTHER): Payer: PPO | Admitting: Family Medicine

## 2022-08-12 ENCOUNTER — Encounter: Payer: Self-pay | Admitting: Family Medicine

## 2022-08-12 VITALS — BP 136/66 | HR 74 | Temp 97.4°F | Ht 63.5 in | Wt 181.4 lb

## 2022-08-12 DIAGNOSIS — R220 Localized swelling, mass and lump, head: Secondary | ICD-10-CM | POA: Insufficient documentation

## 2022-08-12 DIAGNOSIS — J3089 Other allergic rhinitis: Secondary | ICD-10-CM | POA: Diagnosis not present

## 2022-08-12 DIAGNOSIS — J0100 Acute maxillary sinusitis, unspecified: Secondary | ICD-10-CM | POA: Diagnosis not present

## 2022-08-12 DIAGNOSIS — R300 Dysuria: Secondary | ICD-10-CM

## 2022-08-12 LAB — POC URINALSYSI DIPSTICK (AUTOMATED)
Bilirubin, UA: NEGATIVE
Blood, UA: NEGATIVE
Glucose, UA: NEGATIVE
Ketones, UA: NEGATIVE
Nitrite, UA: NEGATIVE
Protein, UA: NEGATIVE
Spec Grav, UA: 1.01 (ref 1.010–1.025)
Urobilinogen, UA: 0.2 E.U./dL
pH, UA: 6.5 (ref 5.0–8.0)

## 2022-08-12 MED ORDER — AMOXICILLIN-POT CLAVULANATE 875-125 MG PO TABS: 1.0000 | ORAL_TABLET | Freq: Two times a day (BID) | ORAL | 0 refills | Status: AC

## 2022-08-12 NOTE — Telephone Encounter (Signed)
[place] Night - Client TELEPHONE ADVICE RECORD [facility] Patient Name: Jennifer Pineda Gender: Female DOB: Sep 08, 1950 Age: 71 Y 3 M 22 D Return Phone Number: [phone] (Primary) Address: City/ State/ Zip: [place]  [address] Client [place] Night - Client Client Site [place] - Night Contact Type Call Who Is Calling Patient / Member / Family / Caregiver Call Type Triage / Clinical Relationship To Patient Self Return Phone Number [phone] (Primary) Chief Complaint Mouth Symptoms Reason for Call Symptomatic / Request for Health Information Initial Comment Caller states her lips are swollen, bleeding and she has blisters. Translation No Nurse Assessment Nurse: Janene Madeira, RN, Wells Guiles Date/Time (Eastern Time): 08/10/2022 1:13:07 PM Confirm and document reason for call. If symptomatic, describe symptoms. ---Caller states her lips are swollen, bleeding and she has blisters. Caller states onset 2 weeks ago; swollen lips and open blisters. Dermatologist did call in slupicasone yesterday and some improvement noted. Had this 2 to 3 years ago - and was given an abx. Does the patient have any new or worsening symptoms? ---Yes Will a triage be completed? ---Yes Related visit to physician within the last 2 weeks? ---No Does the PT have any chronic conditions? (i.e. diabetes, asthma, this includes High risk factors for pregnancy, etc.) ---Yes List chronic conditions. ---Dm htn high cholesterol Is this a behavioral health or substance abuse call? ---No Guidelines Guideline Title Affirmed Question Affirmed Notes Nurse Date/Time (Eastern Time) Cold Sores (Fever Blisters) [1] Red streak or red area spreading from cold sore AND [2] no fever Janene Madeira, RN, Wells Guiles 08/10/2022 1:16:51 PM Disp. Time Eilene Ghazi Time) Disposition Final User 08/10/2022 1:19:24 PM See PCP within 24 Hours Yes Torrens,  RN, Wells Guiles PLEASE NOTE: All timestamps contained within this report are represented as [place] Standard Time. CONFIDENTIALTY NOTICE: This fax transmission is intended only for the addressee. It contains information that is legally privileged, confidential or otherwise protected from use or disclosure. If you are not the intended recipient, you are strictly prohibited from reviewing, disclosing, copying using or disseminating any of this information or taking any action in reliance on or regarding this information. If you have received this fax in error, please notify us immediately by telephone so that we can arrange for its return to [place]. Phone: [phone], Toll-Free: [phone], Fax: [phone] Page: 2 of 2 Call Id: [id number] Final Disposition 08/10/2022 1:19:24 PM See PCP within 24 Hours Yes Torrens, RN, Romualdo Bolk Disagree/Comply Comply Caller Understands Yes PreDisposition [place] Advice Given Per Guideline SEE PCP WITHIN 24 HOURS: * IF OFFICE WILL BE CLOSED: You need to be seen within the next 24 hours. A clinic or an urgent care center is often a good source of care if your doctor's office is closed or you can't get an appointment. * Use nurse judgment to select the most appropriate source of care. * Consider both the urgency of the patient's symptoms AND what resources may be needed to evaluate and manage the patient. CLEANSING: * Wash the infected area with warm water and an antibacterial soap. * Do this three times a day. ANTIBIOTIC OINTMENT - INFECTED AREA: * Put a small amount of antibiotic ointment on the infected area 3 times a day. * You can get this over-the-counter (OTC) at a drugstore. * Use Bacitracin ointment (OTC in [place] or Polysporin ointment (OTC in [place]) or one that you already have. * Cover the area with a clean gauze or an adhesive bandage (such  as a Band-Aid). CALL BACK IF: * Fever occurs * You become worse CARE ADVICE given per Fever Blisters of Lip  (Cold Sores) (Adult) guideline. Referrals REFERRED TO PCP OFFIC

## 2022-08-12 NOTE — Telephone Encounter (Signed)
[place] Night - Client TELEPHONE ADVICE RECORD [facility] Patient Name: Jennifer Pineda Gender: Female DOB: [birth year] Age: 71 Y 3 M 22 D Return Phone Number: [phone] (Primary) Address: City/ State/ Zip: [place]  [address] Client [place] Night - Client Client Site [place] Provider Ria Bush - MD Contact Type Call Who Is Calling Patient / Member / Family / Caregiver Call Type Triage / Clinical Relationship To Patient Self Return Phone Number [phone] (Primary) Chief Complaint Facial Swelling Reason for Call Symptomatic / Request for Martha states she is calling to schedule an appt. C/o swollen lips and breaking open with blisters. Translation No Nurse Assessment Nurse: Fritz Pickerel, RN, Steffanie Dunn Date/Time Eilene Ghazi Time): 08/10/2022 11:52:43 AM Confirm and document reason for call. If symptomatic, describe symptoms. ---Caller states onset 2 weeks ago; swollen lips and open blisters. Dermatologist did call in slupicasone yesterday and some improvement noted. Had this 2 to 3 years ago - and was given an abx. Does the patient have any new or worsening symptoms? ---Yes Will a triage be completed? ---Yes Related visit to physician within the last 2 weeks? ---No Does the PT have any chronic conditions? (i.e. diabetes, asthma, this includes High risk factors for pregnancy, etc.) ---Yes List chronic conditions. ---Dm htn hyperchol Is this a behavioral health or substance abuse call? ---No Guidelines Guideline Title Affirmed Question Affirmed Notes Nurse Date/Time (Eastern Time) Cold Sores (Fever Blisters) [1] Red streak or red area spreading from cold sore AND [2] no fever Fritz Pickerel, RN, Steffanie Dunn 08/10/2022 11:55:53 AM Disp. Time Eilene Ghazi Time) Disposition Final User 08/10/2022 12:00:30 PM See PCP within 24 Hours Yes Fritz Pickerel, RN,  Steffanie Dunn PLEASE NOTE: All timestamps contained within this report are represented as [place] Standard Time. CONFIDENTIALTY NOTICE: This fax transmission is intended only for the addressee. It contains information that is legally privileged, confidential or otherwise protected from use or disclosure. If you are not the intended recipient, you are strictly prohibited from reviewing, disclosing, copying using or disseminating any of this information or taking any action in reliance on or regarding this information. If you have received this fax in error, please notify us immediately by telephone so that we can arrange for its return to [place]. Phone: [phone], Toll-Free: [phone], Fax: [phone] Page: 2 of 2 Call Id: [id number] Final Disposition 08/10/2022 12:00:30 PM See PCP within 24 Hours Yes Fritz Pickerel, RN, Steffanie Dunn Caller Disagree/Comply Comply Caller Understands Yes PreDisposition Call Doctor Care Advice Given Per Guideline SEE PCP WITHIN 24 HOURS: * IF OFFICE WILL BE OPEN: You need to be examined within the next 24 hours. Call your doctor (or NP/PA) when the office opens and make an appointment. CALL BACK IF: * Fever occurs * You become worse Referrals [facility] at [facility] at [place]

## 2022-08-12 NOTE — Assessment & Plan Note (Signed)
Anticipate bacterial sinusitis given duration of symptom as well as unilateral location of pain/swelling to R maxillary sinus - Rx augmentin 10d course. Continue antihistamine and nasal steroid.

## 2022-08-12 NOTE — Patient Instructions (Addendum)
Urinalysis today.  I want to treat you with oral antibiotic augmentin twice daily for 10 days for possible sinus infection. Continue dermatology prescribed ointment.  Use vaseline to lips as well.  Let us know if not improving with this.

## 2022-08-12 NOTE — Assessment & Plan Note (Addendum)
Swelling isolated to lower lip with associated ulceration. Seems to be improving since she started dermatology prescribed fluticasone ointment. Will continue this. Discussed vaseline use as well.  Not consistent with allergic reaction.

## 2022-08-12 NOTE — Assessment & Plan Note (Addendum)
Dysuria and gross hematuria isolated last week, that resolved with azo use. Update UA/micro today - overall reassuring. UCx not sent.

## 2022-08-12 NOTE — Telephone Encounter (Signed)
Per chart review tab pt was seen by Dr Darnell Level earlier today. Sending note to Dr Darnell Level.

## 2022-08-12 NOTE — Progress Notes (Signed)
Patient ID: Jennifer Pineda, female    DOB: 1951-07-08, 71 y.o.   MRN: [id number]  This visit was conducted in person.  BP 136/66   Pulse 74   Temp (!) 97.4 F (36.3 C) (Temporal)   Ht 5' 3.5" (1.613 m)   Wt 181 lb 6.4 oz (82.3 kg)   SpO2 96%   BMI 31.63 kg/m    CC: lip swelling Subjective:   HPI: Jennifer Pineda is a 71 y.o. female presenting on 08/12/2022 for Facial Swelling (C/o lip swelling, peeling, splitting open and blisters. Started about 2 wks ago. Tried Vaseline, barley helpful. Spoke with dermatology, prescribed fluticasone cream- helpful. Has derm appt 09/17/22. /)   2 wk h/o lower lip swelling, bleeding with blisters.  Dermatologist prescribed fluticasone cream (cutivate), just started yesterday with some benefit. Lips still feel painful and swollen.   No fevers/chills, cough, abd pain. No throat or tongue/lip swelling, dyspnea.  Intermittent diarrhea.  H/o cold sores, this was more swollen.   R sinus pain present for the past week. She's been sneezing more - water damage at home with significant dust exposure at home, in process of getting this fixed (since 03/2022). Asthma, allergies are flaring up. High stress at home.   No new lotions, detergents, soaps, shampoos, new medicines, vitamins, supplements. No new foods.   H/o similar issue several years ago that did improve with antibiotic ointment.  She continues claritin, flonase.   She noted small amt blood in urine and dysuria, that resolved after taking azo course.   Upcoming vacation to [place]      Relevant past medical, surgical, family and social history reviewed and updated as indicated. Interim medical history since our last visit reviewed. Allergies and medications reviewed and updated. Outpatient Medications Prior to Visit  Medication Sig Dispense Refill   albuterol (VENTOLIN HFA) 108 (90 Base) MCG/ACT inhaler Inhale 2 puffs into the lungs every 6 (six) hours as needed. For shortness of breath. 18 g  6   amLODipine (NORVASC) 10 MG tablet Take 1 tablet (10 mg total) by mouth daily. 90 tablet 3   aspirin EC 81 MG tablet Take 1 tablet (81 mg total) by mouth daily. Swallow whole.     atorvastatin (LIPITOR) 40 MG tablet Take 1 tablet (40 mg total) by mouth daily. 90 tablet 3   Blood Glucose Monitoring Suppl (ONE TOUCH ULTRA SYSTEM KIT) w/Device KIT 1 kit by Does not apply route once. 1 each 0   FLUoxetine (PROZAC) 10 MG capsule Take 1 capsule (10 mg total) by mouth daily. 90 capsule 3   fluticasone (CUTIVATE) 0.05 % cream Apply topically. For lips     fluticasone (FLONASE) 50 MCG/ACT nasal spray Use 2 spray(s) in each nostril once daily 48 g 3   fluticasone furoate-vilanterol (BREO ELLIPTA) 100-25 MCG/ACT AEPB Inhale 1 puff into the lungs daily. 90 each 3   glucose blood (ONE TOUCH ULTRA TEST) test strip 1 each by Other route daily. Use to check sugar once daily and as needed. Dx: E11.9 100 each 3   lisinopril (ZESTRIL) 10 MG tablet Take 1 tablet (10 mg total) by mouth daily. 90 tablet 3   loratadine (CLARITIN) 10 MG tablet TAKE 1 TABLET BY MOUTH ONCE DAILY 90 tablet 1   metFORMIN (GLUCOPHAGE XR) 500 MG 24 hr tablet Take 1 tablet (500 mg total) by mouth daily with breakfast. 90 tablet 3   Multiple Vitamins-Calcium (VIACTIV MULTI-VITAMIN) CHEW Chew 1 tablet by mouth daily.  Multiple Vitamins-Minerals (PRESERVISION AREDS PO) Take by mouth at bedtime.     omeprazole (PRILOSEC) 40 MG capsule Take 1 capsule (40 mg total) by mouth daily. 90 capsule 3   ONETOUCH DELICA LANCETS FINE MISC Use as directed - check as needed or 3 times weekly 100 each 1   triamterene-hydrochlorothiazide (MAXZIDE-25) 37.5-25 MG tablet Take 1 tablet by mouth daily. 90 tablet 3   Influenza vac split quadrivalent PF (FLUZONE HIGH-DOSE) 0.5 ML injection      metoprolol tartrate (LOPRESSOR) 50 MG tablet Take one tablet by mouth 2 hours prior to CT scan 1 tablet 0   No facility-administered medications prior to visit.     Per  HPI unless specifically indicated in ROS section below Review of Systems  Objective:  BP 136/66   Pulse 74   Temp (!) 97.4 F (36.3 C) (Temporal)   Ht 5' 3.5" (1.613 m)   Wt 181 lb 6.4 oz (82.3 kg)   SpO2 96%   BMI 31.63 kg/m   Wt Readings from Last 3 Encounters:  08/12/22 181 lb 6.4 oz (82.3 kg)  03/28/22 178 lb 9.6 oz (81 kg)  03/22/22 177 lb 4 oz (80.4 kg)      Physical Exam Vitals and nursing note reviewed.  Constitutional:      Appearance: Normal appearance. She is not ill-appearing.  HENT:     Head: Normocephalic and atraumatic.     Right Ear: Tympanic membrane, ear canal and external ear normal. There is no impacted cerumen.     Left Ear: Tympanic membrane, ear canal and external ear normal. There is no impacted cerumen.     Nose: Nasal tenderness, mucosal edema and congestion present. No rhinorrhea.     Right Turbinates: Not enlarged, swollen or pale.     Left Turbinates: Not enlarged, swollen or pale.     Right Sinus: Maxillary sinus tenderness present. No frontal sinus tenderness.     Left Sinus: No maxillary sinus tenderness or frontal sinus tenderness.     Mouth/Throat:     Lips: Lesions present.     Mouth: Mucous membranes are moist.     Pharynx: Oropharynx is clear. No oropharyngeal exudate or posterior oropharyngeal erythema.     Comments: Swollen macerated lower lip, brings picture from last week of what appears to be ulceration to lower lip  Eyes:     Extraocular Movements: Extraocular movements intact.     Conjunctiva/sclera: Conjunctivae normal.     Pupils: Pupils are equal, round, and reactive to light.  Cardiovascular:     Rate and Rhythm: Normal rate and regular rhythm.     Pulses: Normal pulses.     Heart sounds: Normal heart sounds. No murmur heard. Pulmonary:     Effort: Pulmonary effort is normal. No respiratory distress.     Breath sounds: Normal breath sounds. No wheezing, rhonchi or rales.  Musculoskeletal:     Right lower leg: No edema.      Left lower leg: No edema.  Lymphadenopathy:     Head:     Right side of head: No submental, submandibular, tonsillar, preauricular or posterior auricular adenopathy.     Left side of head: No submental, submandibular, tonsillar, preauricular or posterior auricular adenopathy.     Cervical: No cervical adenopathy.     Right cervical: No superficial cervical adenopathy.    Left cervical: No superficial cervical adenopathy.     Upper Body:     Right upper body: No supraclavicular adenopathy.  Left upper body: No supraclavicular adenopathy.  Skin:    General: Skin is warm and dry.     Findings: No rash.  Neurological:     Mental Status: She is alert.  Psychiatric:        Mood and Affect: Mood normal.        Behavior: Behavior normal.       Results for orders placed or performed in visit on 08/12/22  POCT Urinalysis Dipstick (Automated)  Result Value Ref Range   Color, UA yellow    Clarity, UA clear    Glucose, UA Negative Negative   Bilirubin, UA neg    Ketones, UA neg    Spec Grav, UA 1.010 1.010 - 1.025   Blood, UA neg    pH, UA 6.5 5.0 - 8.0   Protein, UA Negative Negative   Urobilinogen, UA 0.2 0.2 or 1.0 E.U./dL   Nitrite, UA neg    Leukocytes, UA Small (1+) (A) Negative    Assessment & Plan:   Problem List Items Addressed This Visit     Allergic rhinitis   Acute sinusitis - Primary    Anticipate bacterial sinusitis given duration of symptom as well as unilateral location of pain/swelling to R maxillary sinus - Rx augmentin 10d course. Continue antihistamine and nasal steroid.       Relevant Medications   amoxicillin-clavulanate (AUGMENTIN) 875-125 MG tablet   Lip swelling    Swelling isolated to lower lip with associated ulceration. Seems to be improving since she started dermatology prescribed fluticasone ointment. Will continue this. Discussed vaseline use as well.  Not consistent with allergic reaction.       Dysuria    Dysuria and gross hematuria  isolated last week, that resolved with azo use. Update UA/micro today - overall reassuring. UCx not sent.       Relevant Orders   POCT Urinalysis Dipstick (Automated) (Completed)     Meds ordered this encounter  Medications   amoxicillin-clavulanate (AUGMENTIN) 875-125 MG tablet    Sig: Take 1 tablet by mouth 2 (two) times daily for 10 days.    Dispense:  20 tablet    Refill:  0   Orders Placed This Encounter  Procedures   POCT Urinalysis Dipstick (Automated)     Patient Instructions  Urinalysis today.  I want to treat you with oral antibiotic augmentin twice daily for 10 days for possible sinus infection. Continue dermatology prescribed ointment.  Use vaseline to lips as well.  Let us know if not improving with this.  Follow up plan: Return if symptoms worsen or fail to improve.  Ria Bush, MD

## 2022-09-05 ENCOUNTER — Other Ambulatory Visit: Payer: Self-pay | Admitting: Family Medicine

## 2022-09-05 DIAGNOSIS — Z1231 Encounter for screening mammogram for malignant neoplasm of breast: Secondary | ICD-10-CM

## 2022-09-12 ENCOUNTER — Ambulatory Visit
Admission: RE | Admit: 2022-09-12 | Discharge: 2022-09-12 | Disposition: A | Discharge status: Home / Self Care | Payer: PPO | Source: Ambulatory Visit | Attending: Family Medicine | Admitting: Family Medicine

## 2022-09-12 DIAGNOSIS — Z1231 Encounter for screening mammogram for malignant neoplasm of breast: Secondary | ICD-10-CM | POA: Insufficient documentation

## 2022-09-15 ENCOUNTER — Other Ambulatory Visit: Payer: Self-pay | Admitting: Family Medicine

## 2022-09-17 DIAGNOSIS — K130 Diseases of lips: Secondary | ICD-10-CM | POA: Diagnosis not present

## 2022-09-17 DIAGNOSIS — B001 Herpesviral vesicular dermatitis: Secondary | ICD-10-CM | POA: Diagnosis not present

## 2022-09-23 ENCOUNTER — Encounter: Payer: Self-pay | Admitting: Family Medicine

## 2022-09-23 ENCOUNTER — Ambulatory Visit (INDEPENDENT_AMBULATORY_CARE_PROVIDER_SITE_OTHER): Payer: PPO | Admitting: Family Medicine

## 2022-09-23 VITALS — BP 128/70 | HR 65 | Temp 97.6°F | Ht 63.5 in | Wt 183.4 lb

## 2022-09-23 DIAGNOSIS — R0789 Other chest pain: Secondary | ICD-10-CM | POA: Diagnosis not present

## 2022-09-23 DIAGNOSIS — E785 Hyperlipidemia, unspecified: Secondary | ICD-10-CM

## 2022-09-23 DIAGNOSIS — I10 Essential (primary) hypertension: Secondary | ICD-10-CM

## 2022-09-23 DIAGNOSIS — R21 Rash and other nonspecific skin eruption: Secondary | ICD-10-CM | POA: Diagnosis not present

## 2022-09-23 DIAGNOSIS — E1169 Type 2 diabetes mellitus with other specified complication: Secondary | ICD-10-CM | POA: Diagnosis not present

## 2022-09-23 DIAGNOSIS — E118 Type 2 diabetes mellitus with unspecified complications: Secondary | ICD-10-CM | POA: Diagnosis not present

## 2022-09-23 LAB — POCT GLYCOSYLATED HEMOGLOBIN (HGB A1C): Hemoglobin A1C: 6.3 % — AB (ref 4.0–5.6)

## 2022-09-23 MED ORDER — COENZYME Q10 100 MG PO CAPS: 100.0000 mg | ORAL_CAPSULE | Freq: Every day | ORAL | Status: DC

## 2022-09-23 NOTE — Assessment & Plan Note (Signed)
Chronic, on statin. She did not tolerate atorvastatin '40mg'$  daily so dropped to '20mg'$  and she is tolerating this better. Discussed starting CoQ10 supplementation.  The 10-year ASCVD risk score (Arnett DK, et al., 2019) is: 30%   Values used to calculate the score:     Age: 72 years     Sex: Female     Is Non-Hispanic African American: No     Diabetic: Yes     Tobacco smoker: No     Systolic Blood Pressure: 832 mmHg     Is BP treated: Yes     HDL Cholesterol: 48.4 mg/dL     Total Cholesterol: 166 mg/dL

## 2022-09-23 NOTE — Assessment & Plan Note (Signed)
Possible candidal rash vs cellulitis. Rec start lotrimin BID x 2 wks, update with effect. If ongoing, low threshold to start antibiotic.

## 2022-09-23 NOTE — Assessment & Plan Note (Signed)
Chronic, well controlled on low dose metformin XR- continue this.

## 2022-09-23 NOTE — Progress Notes (Signed)
Patient ID: Jennifer Pineda, female    DOB: 13-Jun-1951, 72 y.o.   MRN: [id number]  This visit was conducted in person.  BP 128/70   Pulse 65   Temp 97.6 F (36.4 C) (Temporal)   Ht 5' 3.5" (1.613 m)   Wt 183 lb 6 oz (83.2 kg)   SpO2 97%   BMI 31.97 kg/m    CC: 6 mo DM f/u visit  Subjective:   HPI: Jennifer Pineda is a 72 y.o. female presenting on 09/23/2022 for Medical Management of Chronic Issues (Here for 6 mo DM f/u.)   PNdrainage started this morning. Notes some nasal congestion. No fever, cough.   Also notes red itchy tender rash to anterior lower abdomen inguinal region - present for a few days. Tried cutivate steroid lip cream.   Saw Dr Irish Lack cardiology 03/2022 for precordial chest pain, CTA coronary artery with calcium score returned reassuringly ok, was abnormal to apical LAD - rec medical management.  HLD - she was unable to tolerate '40mg'$  dose due to leg myalgias and cramping - so is taking '20mg'$  instead and tolerating this much better.   DM - does not regularly check sugars, last checked 3 wks ago fasting 105. Compliant with antihyperglycemic regimen which includes: metformin XR '500mg'$  daily. Denies low sugars or hypoglycemic symptoms. Denies paresthesias, blurry vision. Last diabetic eye exam 03/2022. Glucometer brand: one-touch ultra. Last foot exam: 08/2020 - DUE. DSME: completed 10/2015.  Lab Results  Component Value Date   HGBA1C 6.3 (A) 09/23/2022   Diabetic Foot Exam - Simple   Simple Foot Form Diabetic Foot exam was performed with the following findings: Yes 09/23/2022 10:21 AM  Visual Inspection No deformities, no ulcerations, no other skin breakdown bilaterally: Yes Sensation Testing Intact to touch and monofilament testing bilaterally: Yes Pulse Check Posterior Tibialis and Dorsalis pulse intact bilaterally: Yes Comments    Lab Results  Component Value Date   MICROALBUR 1.4 03/15/2022         Relevant past medical, surgical, family and social  history reviewed and updated as indicated. Interim medical history since our last visit reviewed. Allergies and medications reviewed and updated. Outpatient Medications Prior to Visit  Medication Sig Dispense Refill   albuterol (VENTOLIN HFA) 108 (90 Base) MCG/ACT inhaler Inhale 2 puffs into the lungs every 6 (six) hours as needed. For shortness of breath. 18 g 6   amLODipine (NORVASC) 10 MG tablet Take 1 tablet (10 mg total) by mouth daily. 90 tablet 3   aspirin EC 81 MG tablet Take 1 tablet (81 mg total) by mouth daily. Swallow whole.     Blood Glucose Monitoring Suppl (ONE TOUCH ULTRA SYSTEM KIT) w/Device KIT 1 kit by Does not apply route once. 1 each 0   BREO ELLIPTA 100-25 MCG/ACT AEPB Inhale 1 puff by mouth once daily 90 each 0   FLUoxetine (PROZAC) 10 MG capsule Take 1 capsule (10 mg total) by mouth daily. 90 capsule 3   fluticasone (CUTIVATE) 0.005 % ointment Apply topically daily as needed.     fluticasone (CUTIVATE) 0.05 % cream Apply topically. For lips     fluticasone (FLONASE) 50 MCG/ACT nasal spray Use 2 spray(s) in each nostril once daily 48 g 3   glucose blood (ONE TOUCH ULTRA TEST) test strip 1 each by Other route daily. Use to check sugar once daily and as needed. Dx: E11.9 100 each 3   lisinopril (ZESTRIL) 10 MG tablet Take 1 tablet (10 mg total)  by mouth daily. 90 tablet 3   loratadine (CLARITIN) 10 MG tablet TAKE 1 TABLET BY MOUTH ONCE DAILY 90 tablet 1   metFORMIN (GLUCOPHAGE XR) 500 MG 24 hr tablet Take 1 tablet (500 mg total) by mouth daily with breakfast. 90 tablet 3   Multiple Vitamins-Calcium (VIACTIV MULTI-VITAMIN) CHEW Chew 1 tablet by mouth daily.     Multiple Vitamins-Minerals (PRESERVISION AREDS PO) Take by mouth at bedtime.     omeprazole (PRILOSEC) 40 MG capsule Take 1 capsule (40 mg total) by mouth daily. 90 capsule 3   ONETOUCH DELICA LANCETS FINE MISC Use as directed - check as needed or 3 times weekly 100 each 1   triamterene-hydrochlorothiazide (MAXZIDE-25)  37.5-25 MG tablet Take 1 tablet by mouth daily. 90 tablet 3   valACYclovir (VALTREX) 1000 MG tablet SMARTSIG:2 Tablet(s) By Mouth Every 12 Hours     atorvastatin (LIPITOR) 40 MG tablet Take 1 tablet (40 mg total) by mouth daily. 90 tablet 3   atorvastatin (LIPITOR) 40 MG tablet Take 0.5 tablets (20 mg total) by mouth daily.     No facility-administered medications prior to visit.     Per HPI unless specifically indicated in ROS section below Review of Systems  Objective:  BP 128/70   Pulse 65   Temp 97.6 F (36.4 C) (Temporal)   Ht 5' 3.5" (1.613 m)   Wt 183 lb 6 oz (83.2 kg)   SpO2 97%   BMI 31.97 kg/m   Wt Readings from Last 3 Encounters:  09/23/22 183 lb 6 oz (83.2 kg)  08/12/22 181 lb 6.4 oz (82.3 kg)  03/28/22 178 lb 9.6 oz (81 kg)      Physical Exam Vitals and nursing note reviewed.  Constitutional:      Appearance: Normal appearance. She is not ill-appearing.  Eyes:     Extraocular Movements: Extraocular movements intact.     Conjunctiva/sclera: Conjunctivae normal.     Pupils: Pupils are equal, round, and reactive to light.  Cardiovascular:     Rate and Rhythm: Normal rate and regular rhythm.     Pulses: Normal pulses.     Heart sounds: Normal heart sounds. No murmur heard. Pulmonary:     Effort: Pulmonary effort is normal. No respiratory distress.     Breath sounds: Normal breath sounds. No wheezing, rhonchi or rales.  Musculoskeletal:     Right lower leg: No edema.     Left lower leg: No edema.  Skin:    General: Skin is warm and dry.     Findings: Erythema and rash present.     Comments: Tender patches of erythema to midline anterior inguinal region   Neurological:     Mental Status: She is alert.  Psychiatric:        Mood and Affect: Mood normal.        Behavior: Behavior normal.       Results for orders placed or performed in visit on 09/23/22  POCT glycosylated hemoglobin (Hb A1C)  Result Value Ref Range   Hemoglobin A1C 6.3 (A) 4.0 - 5.6 %    HbA1c POC (<> result, manual entry)     HbA1c, POC (prediabetic range)     HbA1c, POC (controlled diabetic range)      Assessment & Plan:   Problem List Items Addressed This Visit     Hyperlipidemia associated with type 2 diabetes mellitus (HCC)    Chronic, on statin. She did not tolerate atorvastatin '40mg'$  daily so dropped to '20mg'$  and  she is tolerating this better. Discussed starting CoQ10 supplementation.  The 10-year ASCVD risk score (Arnett DK, et al., 2019) is: 30%   Values used to calculate the score:     Age: 49 years     Sex: Female     Is Non-Hispanic African American: No     Diabetic: Yes     Tobacco smoker: No     Systolic Blood Pressure: 884 mmHg     Is BP treated: Yes     HDL Cholesterol: 48.4 mg/dL     Total Cholesterol: 166 mg/dL       Relevant Medications   atorvastatin (LIPITOR) 40 MG tablet   Essential hypertension    Chronic, stable on current regimen - continue.       Relevant Medications   atorvastatin (LIPITOR) 40 MG tablet   Diabetes mellitus type 2 with complications (HCC) - Primary    Chronic, well controlled on low dose metformin XR- continue this.       Relevant Medications   atorvastatin (LIPITOR) 40 MG tablet   Other Relevant Orders   POCT glycosylated hemoglobin (Hb A1C) (Completed)   Chest tightness    S/p reassuring cardiac evaluation last year.       Skin rash    Possible candidal rash vs cellulitis. Rec start lotrimin BID x 2 wks, update with effect. If ongoing, low threshold to start antibiotic.         Meds ordered this encounter  Medications   Coenzyme Q10 100 MG capsule    Sig: Take 1 capsule (100 mg total) by mouth daily.    Orders Placed This Encounter  Procedures   POCT glycosylated hemoglobin (Hb A1C)    Patient Instructions  Continue atorvastatin '20mg'$  daily (1/2 tab of '40mg'$ ).  Look into trying CoEnzyme Q10 50-'100mg'$  daily supplement for muscle ache while on statin.  For skin rash - try lotrimin over the counter  antifungal twice daily for 2 weeks. If not improving or any worsening, let me know to consider oral antibiotic course.  Return in 6 months for physical/wellness visit.   Follow up plan: Return in about 6 months (around 03/24/2023) for annual exam, prior fasting for blood work, medicare wellness visit.  Ria Bush, MD

## 2022-09-23 NOTE — Assessment & Plan Note (Signed)
Chronic, stable on current regimen - continue. 

## 2022-09-23 NOTE — Assessment & Plan Note (Signed)
S/p reassuring cardiac evaluation last year.

## 2022-09-23 NOTE — Patient Instructions (Addendum)
Continue atorvastatin '20mg'$  daily (1/2 tab of '40mg'$ ).  Look into trying CoEnzyme Q10 50-'100mg'$  daily supplement for muscle ache while on statin.  For skin rash - try lotrimin over the counter antifungal twice daily for 2 weeks. If not improving or any worsening, let me know to consider oral antibiotic course.  Return in 6 months for physical/wellness visit.

## 2022-09-24 ENCOUNTER — Other Ambulatory Visit: Payer: Self-pay | Admitting: Family Medicine

## 2022-10-31 DIAGNOSIS — B001 Herpesviral vesicular dermatitis: Secondary | ICD-10-CM | POA: Diagnosis not present

## 2022-11-06 ENCOUNTER — Encounter: Payer: Self-pay | Admitting: Internal Medicine

## 2022-11-26 ENCOUNTER — Other Ambulatory Visit: Payer: Self-pay

## 2022-11-26 MED ORDER — BREO ELLIPTA 100-25 MCG/ACT IN AEPB: INHALATION_SPRAY | RESPIRATORY_TRACT | 0 refills | Status: DC

## 2022-11-30 DIAGNOSIS — J209 Acute bronchitis, unspecified: Secondary | ICD-10-CM | POA: Diagnosis not present

## 2022-11-30 DIAGNOSIS — Z20822 Contact with and (suspected) exposure to covid-19: Secondary | ICD-10-CM | POA: Diagnosis not present

## 2022-12-09 ENCOUNTER — Telehealth: Payer: Self-pay | Admitting: Family Medicine

## 2022-12-09 NOTE — Telephone Encounter (Signed)
Pt called stating her blood sugar was currently a 226. Pt stated she currently takes metFORMIN (GLUCOPHAGE XR) 500 MG 24 hr tablet & is confused on why her Blood Sugar is so high. Pt  requested to speak to [facility], transferred pt to access nurse. Call back # [phone]

## 2022-12-09 NOTE — Telephone Encounter (Signed)
Left message on voicemail for patient to call the office back. 

## 2022-12-09 NOTE — Telephone Encounter (Signed)
Sugar was likely high due to steroid use from recent bronchitis treatment.  Push fluids, sugars should continue to improve as she's finished course. Let us know if ongoing hyperglycemia >200  Lab Results  Component Value Date   HGBA1C 6.3 (A) 09/23/2022

## 2022-12-09 NOTE — Telephone Encounter (Signed)
Sending note to Dr Sharen Hones and Sharen Hones pool.

## 2022-12-11 NOTE — Telephone Encounter (Signed)
Spoke with pt relaying Dr. Timoteo Expose message. Pt verbalizes understanding and states BS is returning to normal. Today was 127.

## 2022-12-11 NOTE — Telephone Encounter (Signed)
Lvm asking pt to call back.  Need to relay Dr. G's message.  

## 2022-12-11 NOTE — Telephone Encounter (Signed)
Patient returned call. Would like a return call back

## 2022-12-17 DIAGNOSIS — L814 Other melanin hyperpigmentation: Secondary | ICD-10-CM | POA: Diagnosis not present

## 2022-12-17 DIAGNOSIS — D225 Melanocytic nevi of trunk: Secondary | ICD-10-CM | POA: Diagnosis not present

## 2022-12-17 DIAGNOSIS — L821 Other seborrheic keratosis: Secondary | ICD-10-CM | POA: Diagnosis not present

## 2022-12-20 ENCOUNTER — Other Ambulatory Visit: Payer: Self-pay | Admitting: Family Medicine

## 2022-12-20 DIAGNOSIS — J4530 Mild persistent asthma, uncomplicated: Secondary | ICD-10-CM

## 2022-12-20 MED ORDER — BREO ELLIPTA 100-25 MCG/ACT IN AEPB: INHALATION_SPRAY | RESPIRATORY_TRACT | 0 refills | Status: DC

## 2022-12-20 NOTE — Telephone Encounter (Signed)
Prescription Request  12/20/2022  LOV: 09/23/2022  What is the name of the medication or equipment?  BREO ELLIPTA 100-25 MCG/ACT AEPB   Have you contacted your pharmacy to request a refill? Yes   Which pharmacy would you like this sent to?  [facility] [address] [place] - [address] [place] [address] Phone: [phone] Fax: [phone]     Patient notified that their request is being sent to the clinical staff for review and that they should receive a response within 2 business days.   Please advise at [facility] [phone]

## 2022-12-20 NOTE — Telephone Encounter (Signed)
Breo Last rx:  11/26/22, #90 Last OV: 09/23/22, 6 mo DM  Next OV:  03/25/23, CPE

## 2023-02-18 DIAGNOSIS — M79671 Pain in right foot: Secondary | ICD-10-CM | POA: Diagnosis not present

## 2023-02-18 DIAGNOSIS — S8001XA Contusion of right knee, initial encounter: Secondary | ICD-10-CM | POA: Diagnosis not present

## 2023-02-18 DIAGNOSIS — S92424A Nondisplaced fracture of distal phalanx of right great toe, initial encounter for closed fracture: Secondary | ICD-10-CM | POA: Diagnosis not present

## 2023-03-07 ENCOUNTER — Other Ambulatory Visit: Payer: Self-pay | Admitting: Family Medicine

## 2023-03-07 DIAGNOSIS — F341 Dysthymic disorder: Secondary | ICD-10-CM

## 2023-03-07 DIAGNOSIS — I10 Essential (primary) hypertension: Secondary | ICD-10-CM

## 2023-03-07 DIAGNOSIS — E118 Type 2 diabetes mellitus with unspecified complications: Secondary | ICD-10-CM

## 2023-03-07 NOTE — Telephone Encounter (Signed)
Advised patient of refills, cancelled one lab appointment.

## 2023-03-07 NOTE — Telephone Encounter (Signed)
Noted  

## 2023-03-07 NOTE — Telephone Encounter (Signed)
E-scribed refills.  Pt is scheduled for fasting CPE labs on 03/18/23 at 8:00 and 03/19/23 at 8:30.    Plz ask pt to see which day she prefers to have labs, then cancel the other appt.

## 2023-03-08 ENCOUNTER — Other Ambulatory Visit: Payer: Self-pay | Admitting: Family Medicine

## 2023-03-08 DIAGNOSIS — J4530 Mild persistent asthma, uncomplicated: Secondary | ICD-10-CM

## 2023-03-13 DIAGNOSIS — S8001XA Contusion of right knee, initial encounter: Secondary | ICD-10-CM | POA: Diagnosis not present

## 2023-03-13 DIAGNOSIS — S92424A Nondisplaced fracture of distal phalanx of right great toe, initial encounter for closed fracture: Secondary | ICD-10-CM | POA: Diagnosis not present

## 2023-03-13 DIAGNOSIS — M79671 Pain in right foot: Secondary | ICD-10-CM | POA: Diagnosis not present

## 2023-03-17 ENCOUNTER — Other Ambulatory Visit: Payer: Self-pay | Admitting: Family Medicine

## 2023-03-17 DIAGNOSIS — E118 Type 2 diabetes mellitus with unspecified complications: Secondary | ICD-10-CM

## 2023-03-17 DIAGNOSIS — E1169 Type 2 diabetes mellitus with other specified complication: Secondary | ICD-10-CM

## 2023-03-18 ENCOUNTER — Other Ambulatory Visit: Payer: PPO

## 2023-03-19 ENCOUNTER — Other Ambulatory Visit: Payer: PPO

## 2023-03-20 ENCOUNTER — Other Ambulatory Visit (INDEPENDENT_AMBULATORY_CARE_PROVIDER_SITE_OTHER): Payer: PPO

## 2023-03-20 DIAGNOSIS — E118 Type 2 diabetes mellitus with unspecified complications: Secondary | ICD-10-CM

## 2023-03-20 DIAGNOSIS — E1169 Type 2 diabetes mellitus with other specified complication: Secondary | ICD-10-CM | POA: Diagnosis not present

## 2023-03-20 DIAGNOSIS — E785 Hyperlipidemia, unspecified: Secondary | ICD-10-CM | POA: Diagnosis not present

## 2023-03-20 LAB — COMPREHENSIVE METABOLIC PANEL
ALT: 12 U/L (ref 0–35)
AST: 11 U/L (ref 0–37)
Albumin: 4.3 g/dL (ref 3.5–5.2)
Alkaline Phosphatase: 75 U/L (ref 39–117)
BUN: 19 mg/dL (ref 6–23)
CO2: 30 mEq/L (ref 19–32)
Calcium: 9.7 mg/dL (ref 8.4–10.5)
Chloride: 101 mEq/L (ref 96–112)
Creatinine, Ser: 0.76 mg/dL (ref 0.40–1.20)
GFR: 77.46 mL/min (ref >60.00)
Glucose, Bld: 129 mg/dL — ABNORMAL HIGH (ref 70–99)
Potassium: 4.5 mEq/L (ref 3.5–5.1)
Sodium: 139 mEq/L (ref 135–145)
Total Bilirubin: 0.4 mg/dL (ref 0.2–1.2)
Total Protein: 7.6 g/dL (ref 6.0–8.3)

## 2023-03-20 LAB — MICROALBUMIN / CREATININE URINE RATIO
Creatinine,U: 92.9 mg/dL
Microalb Creat Ratio: 1.3 mg/g (ref 0.0–30.0)
Microalb, Ur: 1.2 mg/dL (ref 0.0–1.9)

## 2023-03-20 LAB — LIPID PANEL
Cholesterol: 174 mg/dL (ref 0–200)
HDL: 51.9 mg/dL (ref >39.00)
LDL Cholesterol: 90 mg/dL (ref 0–99)
NonHDL: 122.31
Total CHOL/HDL Ratio: 3
Triglycerides: 162 mg/dL — ABNORMAL HIGH (ref 0.0–149.0)
VLDL: 32.4 mg/dL (ref 0.0–40.0)

## 2023-03-20 LAB — HEMOGLOBIN A1C: Hgb A1c MFr Bld: 6.4 % (ref 4.6–6.5)

## 2023-03-25 ENCOUNTER — Encounter: Payer: PPO | Admitting: Family Medicine

## 2023-03-25 ENCOUNTER — Ambulatory Visit (INDEPENDENT_AMBULATORY_CARE_PROVIDER_SITE_OTHER): Payer: PPO

## 2023-03-25 VITALS — Ht 63.5 in | Wt 185.0 lb

## 2023-03-25 DIAGNOSIS — Z1211 Encounter for screening for malignant neoplasm of colon: Secondary | ICD-10-CM

## 2023-03-25 DIAGNOSIS — Z0000 Encounter for general adult medical examination without abnormal findings: Secondary | ICD-10-CM

## 2023-03-25 NOTE — Patient Instructions (Signed)
Jennifer Pineda , Thank you for taking time to come for your Medicare Wellness Visit. I appreciate your ongoing commitment to your health goals. Please review the following plan we discussed and let me know if I can assist you in the future.   Referrals/Orders/Follow-Ups/Clinician Recommendations: Aim for 30 minutes of exercise or brisk walking, 6-8 glasses of water, and 5 servings of fruits and vegetables each day.   This is a list of the screening recommended for you and due dates:  Health Maintenance  Topic Date Due   COVID-19 Vaccine (3 - 2023-24 season) 04/26/2022   Colon Cancer Screening  10/14/2022   Medicare Annual Wellness Visit  03/22/2023   Flu Shot  03/27/2023   Eye exam for diabetics  04/12/2023   Mammogram  09/13/2023   Hemoglobin A1C  09/20/2023   Complete foot exam   09/24/2023   Yearly kidney function blood test for diabetes  03/19/2024   Yearly kidney health urinalysis for diabetes  03/19/2024   DTaP/Tdap/Td vaccine (3 - Td or Tdap) 05/11/2024   Pneumonia Vaccine  Completed   DEXA scan (bone density measurement)  Completed   Hepatitis C Screening  Completed   Zoster (Shingles) Vaccine  Completed   HPV Vaccine  Aged Out    Advanced directives: (In Chart) A copy of your advanced directives are scanned into your chart should your provider ever need it.  Next Medicare Annual Wellness Visit scheduled for next year: Yes  Preventive Care 58 Years and Older, Female Preventive care refers to lifestyle choices and visits with your health care provider that can promote health and wellness. What does preventive care include? A yearly physical exam. This is also called an annual well check. Dental exams once or twice a year. Routine eye exams. Ask your health care provider how often you should have your eyes checked. Personal lifestyle choices, including: Daily care of your teeth and gums. Regular physical activity. Eating a healthy diet. Avoiding tobacco and drug  use. Limiting alcohol use. Practicing safe sex. Taking low-dose aspirin every day. Taking vitamin and mineral supplements as recommended by your health care provider. What happens during an annual well check? The services and screenings done by your health care provider during your annual well check will depend on your age, overall health, lifestyle risk factors, and family history of disease. Counseling  Your health care provider may ask you questions about your: Alcohol use. Tobacco use. Drug use. Emotional well-being. Home and relationship well-being. Sexual activity. Eating habits. History of falls. Memory and ability to understand (cognition). Work and work Astronomer. Reproductive health. Screening  You may have the following tests or measurements: Height, weight, and BMI. Blood pressure. Lipid and cholesterol levels. These may be checked every 5 years, or more frequently if you are over 42 years old. Skin check. Lung cancer screening. You may have this screening every year starting at age 39 if you have a 30-pack-year history of smoking and currently smoke or have quit within the past 15 years. Fecal occult blood test (FOBT) of the stool. You may have this test every year starting at age 3. Flexible sigmoidoscopy or colonoscopy. You may have a sigmoidoscopy every 5 years or a colonoscopy every 10 years starting at age 55. Hepatitis C blood test. Hepatitis B blood test. Sexually transmitted disease (STD) testing. Diabetes screening. This is done by checking your blood sugar (glucose) after you have not eaten for a while (fasting). You may have this done every 1-3 years. Bone density  scan. This is done to screen for osteoporosis. You may have this done starting at age 66. Mammogram. This may be done every 1-2 years. Talk to your health care provider about how often you should have regular mammograms. Talk with your health care provider about your test results, treatment  options, and if necessary, the need for more tests. Vaccines  Your health care provider may recommend certain vaccines, such as: Influenza vaccine. This is recommended every year. Tetanus, diphtheria, and acellular pertussis (Tdap, Td) vaccine. You may need a Td booster every 10 years. Zoster vaccine. You may need this after age 1. Pneumococcal 13-valent conjugate (PCV13) vaccine. One dose is recommended after age 65. Pneumococcal polysaccharide (PPSV23) vaccine. One dose is recommended after age 64. Talk to your health care provider about which screenings and vaccines you need and how often you need them. This information is not intended to replace advice given to you by your health care provider. Make sure you discuss any questions you have with your health care provider. Document Released: 09/08/2015 Document Revised: 05/01/2016 Document Reviewed: 06/13/2015 Elsevier Interactive Patient Education  2017 [employer].  Fall Prevention in the Home Falls can cause injuries. They can happen to people of all ages. There are many things you can do to make your home safe and to help prevent falls. What can I do on the outside of my home? Regularly fix the edges of walkways and driveways and fix any cracks. Remove anything that might make you trip as you walk through a door, such as a raised step or threshold. Trim any bushes or trees on the path to your home. Use bright outdoor lighting. Clear any walking paths of anything that might make someone trip, such as rocks or tools. Regularly check to see if handrails are loose or broken. Make sure that both sides of any steps have handrails. Any raised decks and porches should have guardrails on the edges. Have any leaves, snow, or ice cleared regularly. Use sand or salt on walking paths during winter. Clean up any spills in your garage right away. This includes oil or grease spills. What can I do in the bathroom? Use night lights. Install grab  bars by the toilet and in the tub and shower. Do not use towel bars as grab bars. Use non-skid mats or decals in the tub or shower. If you need to sit down in the shower, use a plastic, non-slip stool. Keep the floor dry. Clean up any water that spills on the floor as soon as it happens. Remove soap buildup in the tub or shower regularly. Attach bath mats securely with double-sided non-slip rug tape. Do not have throw rugs and other things on the floor that can make you trip. What can I do in the bedroom? Use night lights. Make sure that you have a light by your bed that is easy to reach. Do not use any sheets or blankets that are too big for your bed. They should not hang down onto the floor. Have a firm chair that has side arms. You can use this for support while you get dressed. Do not have throw rugs and other things on the floor that can make you trip. What can I do in the kitchen? Clean up any spills right away. Avoid walking on wet floors. Keep items that you use a lot in easy-to-reach places. If you need to reach something above you, use a strong step stool that has a grab bar. Keep electrical  cords out of the way. Do not use floor polish or wax that makes floors slippery. If you must use wax, use non-skid floor wax. Do not have throw rugs and other things on the floor that can make you trip. What can I do with my stairs? Do not leave any items on the stairs. Make sure that there are handrails on both sides of the stairs and use them. Fix handrails that are broken or loose. Make sure that handrails are as long as the stairways. Check any carpeting to make sure that it is firmly attached to the stairs. Fix any carpet that is loose or worn. Avoid having throw rugs at the top or bottom of the stairs. If you do have throw rugs, attach them to the floor with carpet tape. Make sure that you have a light switch at the top of the stairs and the bottom of the stairs. If you do not have them,  ask someone to add them for you. What else can I do to help prevent falls? Wear shoes that: Do not have high heels. Have rubber bottoms. Are comfortable and fit you well. Are closed at the toe. Do not wear sandals. If you use a stepladder: Make sure that it is fully opened. Do not climb a closed stepladder. Make sure that both sides of the stepladder are locked into place. Ask someone to hold it for you, if possible. Clearly mark and make sure that you can see: Any grab bars or handrails. First and last steps. Where the edge of each step is. Use tools that help you move around (mobility aids) if they are needed. These include: Canes. Walkers. Scooters. Crutches. Turn on the lights when you go into a dark area. Replace any light bulbs as soon as they burn out. Set up your furniture so you have a clear path. Avoid moving your furniture around. If any of your floors are uneven, fix them. If there are any pets around you, be aware of where they are. Review your medicines with your doctor. Some medicines can make you feel dizzy. This can increase your chance of falling. Ask your doctor what other things that you can do to help prevent falls. This information is not intended to replace advice given to you by your health care provider. Make sure you discuss any questions you have with your health care provider. Document Released: 06/08/2009 Document Revised: 01/18/2016 Document Reviewed: 09/16/2014 Elsevier Interactive Patient Education  2017 [employer].

## 2023-03-25 NOTE — Progress Notes (Signed)
Subjective:   Jennifer Pineda is a 72 y.o. female who presents for Medicare Annual (Subsequent) preventive examination.  Visit Complete: Virtual  I connected with  Jennifer Pineda on 03/25/23 by a audio enabled telemedicine application and verified that I am speaking with the correct person using two identifiers.  Patient Location: Home  Provider Location: Home Office  I discussed the limitations of evaluation and management by telemedicine. The patient expressed understanding and agreed to proceed.  Vital Signs: Unable to obtain new vitals due to this being a telehealth visit.   Review of Systems      Cardiac Risk Factors include: advanced age (>42men, >53 women);hypertension;dyslipidemia;diabetes mellitus;sedentary lifestyle     Objective:    Today's Vitals   03/25/23 1117  Weight: 185 lb (83.9 kg)  Height: 5' 3.5" (1.613 m)   Body mass index is 32.26 kg/m.     03/25/2023   11:26 AM 03/21/2022    1:22 PM 03/13/2020   10:40 AM 02/18/2019   12:32 PM 08/21/2018    7:59 AM 11/11/2017    1:01 PM 09/30/2016    2:19 PM  Advanced Directives  Does Patient Have a Medical Advance Directive? Yes Yes Yes Yes Yes Yes Yes  Type of Estate agent of [place];Living will Healthcare Power of [place];Living will Healthcare Power of [place];Living will Healthcare Power of [place];Living will Healthcare Power of [place];Living will Healthcare Power of [place];Living will Healthcare Power of [place];Living will  Does patient want to make changes to medical advance directive? No - Patient declined Yes (Inpatient - patient defers changing a medical advance directive and declines information at this time)       Copy of Healthcare Power of Attorney in Chart? Yes - validated most recent copy scanned in chart (See row information) Yes - validated most recent copy scanned in chart (See row information) Yes - validated most recent copy scanned in chart (See row information) Yes  - validated most recent copy scanned in chart (See row information) No - copy requested Yes No - copy requested    Current Medications (verified) Outpatient Encounter Medications as of 03/25/2023  Medication Sig   albuterol (VENTOLIN HFA) 108 (90 Base) MCG/ACT inhaler Inhale 2 puffs into the lungs every 6 (six) hours as needed. For shortness of breath.   amLODipine (NORVASC) 10 MG tablet Take 1 tablet by mouth once daily   aspirin EC 81 MG tablet Take 1 tablet (81 mg total) by mouth daily. Swallow whole.   atorvastatin (LIPITOR) 40 MG tablet Take 0.5 tablets (20 mg total) by mouth daily.   Blood Glucose Monitoring Suppl (ONE TOUCH ULTRA SYSTEM KIT) w/Device KIT 1 kit by Does not apply route once.   BREO ELLIPTA 100-25 MCG/ACT AEPB Inhale 1 puff by mouth once daily   Coenzyme Q10 100 MG capsule Take 1 capsule (100 mg total) by mouth daily.   FLUoxetine (PROZAC) 10 MG capsule Take 1 capsule by mouth once daily   fluticasone (FLONASE) 50 MCG/ACT nasal spray Use 2 spray(s) in each nostril once daily   glucose blood (ONE TOUCH ULTRA TEST) test strip 1 each by Other route daily. Use to check sugar once daily and as needed. Dx: E11.9   lisinopril (ZESTRIL) 10 MG tablet Take 1 tablet by mouth once daily   loratadine (CLARITIN) 10 MG tablet Take 1 tablet by mouth once daily   metFORMIN (GLUCOPHAGE-XR) 500 MG 24 hr tablet Take 1 tablet by mouth once daily with breakfast  Multiple Vitamins-Calcium (VIACTIV MULTI-VITAMIN) CHEW Chew 1 tablet by mouth daily.   Multiple Vitamins-Minerals (PRESERVISION AREDS PO) Take by mouth at bedtime.   omeprazole (PRILOSEC) 40 MG capsule Take 1 capsule (40 mg total) by mouth daily.   ONETOUCH DELICA LANCETS FINE MISC Use as directed - check as needed or 3 times weekly   triamterene-hydrochlorothiazide (MAXZIDE-25) 37.5-25 MG tablet Take 1 tablet by mouth once daily   valACYclovir (VALTREX) 1000 MG tablet SMARTSIG:2 Tablet(s) By Mouth Every 12 Hours   fluticasone  (CUTIVATE) 0.005 % ointment Apply topically daily as needed. (Patient not taking: Reported on 03/25/2023)   fluticasone (CUTIVATE) 0.05 % cream Apply topically. For lips (Patient not taking: Reported on 03/25/2023)   No facility-administered encounter medications on file as of 03/25/2023.    Allergies (verified) Levofloxacin, Doxycycline hyclate, Metoprolol succinate, Other, Tetracycline hcl, and Adhesive [tape]   History: Past Medical History:  Diagnosis Date   Allergic rhinitis    Anxiety and depression    Arthritis    hands   Asthma, persistent    Basal cell carcinoma 12/2019   s/p excision, margins clear (Dr Wallace Cullens)   CAD (coronary artery disease)    per CT 06-02-2015   Complication of anesthesia    slow to wake   COPD, mild (HCC)    pulmologist--  dr Stephanie Acre ([facility] in [place])   COVID-19 virus infection 09/24/2019   Diet-controlled diabetes mellitus (HCC) 05/01/2011   Completed DSME 10/2015    Diverticulosis of colon    Essential hypertension    GERD (gastroesophageal reflux disease)    History of cellulitis    Nov 2015-- nasal cellulitis--  resolved   History of endometriosis    History of hiatal hernia    Hyperlipidemia    OSA on CPAP    severe per study 05-09-2010   PONV (postoperative nausea and vomiting)    Shingles 12/29/2006   Sleep apnea    Urothelial carcinoma (HCC) 2016   superficial papillary L lateral bladder wall s/p BCG therapy ([place])   Past Surgical History:  Procedure Laterality Date   ABDOMINAL HYSTERECTOMY  1983   and Appendectomy   CARDIOVASCULAR STRESS TEST  10-06-1998   no evidence ischemia or scar/  normal LV function and wall motion, ef 65%   COLONOSCOPY  09/2012   small int hem [name]) rpt 10 yrs   CYSTOSCOPY WITH BIOPSY N/A 08/15/2015   Procedure: CYSTOSCOPY WITH BLADDER RESECTION;  Surgeon: Jerilee Field, MD;  Location: [facility];  Service: Urology;  Laterality: N/A;   ESOPHAGOGASTRODUODENOSCOPY   last one 08/ 2001   LUMBAR LAMINECTOMY/DECOMPRESSION MICRODISCECTOMY  10/ 2013   SEPTOPLASTY  03/  2002   SKIN SURGERY  01/12/2020   mole removed from back   TRANSURETHRAL RESECTION OF BLADDER TUMOR N/A 07/11/2015   Procedure: TRANSURETHRAL RESECTION OF BLADDER TUMOR (TURBT);  Surgeon: Jerilee Field, MD;  Location[facility];  Service: Urology;  Laterality: N/A;   TRANSURETHRAL RESECTION OF BLADDER TUMOR N/A 08/15/2015   Procedure: TRANSURETHRAL RESECTION OF BLADDER TUMOR (TURBT);  Surgeon: Jerilee Field, MD;  Location: [facility];  Service: Urology;  Laterality: N/A;   TRIGGER FINGER RELEASE Right 2014   Family History  Problem Relation Age of Onset   Cancer Mother        kidney and lymphoma CA, neprectomy tumor of lung, non smoker   Kidney disease Mother        T-cell cancer in kidney   Aneurysm Father        ?  in throat   Diabetes Father    Heart disease Father        MI   Diabetes Sister    Hyperlipidemia Sister    Heart disease Brother 87       MI   Alcohol abuse Brother        smokes again   Hypertension Brother    Cancer Brother 73       T-cell renal cancer/ removed   Hypertension Brother    Colon cancer Neg Hx    Esophageal cancer Neg Hx    Rectal cancer Neg Hx    Stomach cancer Neg Hx    Breast cancer Neg Hx    Social History   Socioeconomic History   Marital status: Divorced    Spouse name: Not on file   Number of children: 2   Years of education: Not on file   Highest education level: Not on file  Occupational History   Occupation: Lawyer: [employer]    Comment: [employer]  Tobacco Use   Smoking status: Former    Current packs/day: 0.00    Average packs/day: 2.0 packs/day for 19.0 years (38.0 ttl pk-yrs)    Types: Cigarettes    Start date: 08/27/1967    Quit date: 08/26/1986    Years since quitting: 36.6   Smokeless tobacco: Never  Vaping Use   Vaping status: Never Used  Substance  and Sexual Activity   Alcohol use: Yes    Comment: 3 margaritas a week    Drug use: No   Sexual activity: Never  Other Topics Concern   Not on file  Social History Narrative   Divorced   Daughter [name], lives with her   Children: 2 children // one daughter lives with mother/ son lives with father   Activity: 20 min walk daily   Diet: good water, fruits/vegetables daily.    Social Determinants of Health   Financial Resource Strain: Low Risk  (03/25/2023)   Overall Financial Resource Strain (CARDIA)    Difficulty of Paying Living Expenses: Not hard at all  Food Insecurity: No Food Insecurity (03/25/2023)   Hunger Vital Sign    Worried About Running Out of Food in the Last Year: Never true    Ran Out of Food in the Last Year: Never true  Transportation Needs: No Transportation Needs (03/25/2023)   PRAPARE - Administrator, Civil Service (Medical): No    Lack of Transportation (Non-Medical): No  Physical Activity: Inactive (03/25/2023)   Exercise Vital Sign    Days of Exercise per Week: 0 days    Minutes of Exercise per Session: 0 min  Stress: Stress Concern Present (03/25/2023)   [employer] of Occupational Health - Occupational Stress Questionnaire    Feeling of Stress : To some extent  Social Connections: Moderately Isolated (03/25/2023)   Social Connection and Isolation Panel [NHANES]    Frequency of Communication with Friends and Family: More than three times a week    Frequency of Social Gatherings with Friends and Family: More than three times a week    Attends Religious Services: More than 4 times per year    Active Member of [employer] or Organizations: No    Attends Banker Meetings: Never    Marital Status: Divorced    Tobacco Counseling Counseling given: Not Answered   Clinical Intake:  Pre-visit preparation completed: Yes  Pain : No/denies pain  BMI - recorded: 32.26 Nutritional Status: BMI > 30  Obese Nutritional  Risks: None Diabetes: Yes CBG done?: No Did pt. bring in CBG monitor from home?: No  How often do you need to have someone help you when you read instructions, pamphlets, or other written materials from your doctor or pharmacy?: 1 - Never  Interpreter Needed?: No  Information entered by :: C.Jackelyn Illingworth LPN   Activities of Daily Living    03/25/2023   11:28 AM  In your present state of health, do you have any difficulty performing the following activities:  Hearing? 0  Vision? 0  Difficulty concentrating or making decisions? 0  Walking or climbing stairs? 0  Dressing or bathing? 0  Doing errands, shopping? 0  Preparing Food and eating ? N  Using the Toilet? N  In the past six months, have you accidently leaked urine? N  Do you have problems with loss of bowel control? N  Managing your Medications? N  Managing your Finances? N  Housekeeping or managing your Housekeeping? N    Patient Care Team: Eustaquio Boyden, MD as PCP - General (Family Medicine) Corky Crafts, MD as PCP - Cardiology (Cardiology) Eli Phillips, OD as Consulting Physician (Optometry) Jerilee Field, MD as Consulting Physician (Urology) Linus Salmons, MD as Consulting Physician (Otolaryngology) Stephanie Acre, MD (Inactive) as Consulting Physician (Internal Medicine) Rosina Lowenstein as Physician Assistant (Physician Assistant) Vilinda Flake, [facility] (Inactive) as Pharmacist (Pharmacist)  Indicate any recent Medical Services you may have received from other than [facility] providers in the past year (date may be approximate).     Assessment:   This is a routine wellness examination for Jennifer Pineda.  Hearing/Vision screen Hearing Screening - Comments:: Denies hearing difficulties   Vision Screening - Comments:: Readers - [facility] - Due for yearly exam - Pt will call for appointment.  Dietary issues and exercise activities discussed:     Goals Addressed             This Visit's  Progress    Patient Stated       Lose weight       Depression Screen    03/25/2023   11:25 AM 09/23/2022   10:08 AM 03/21/2022    1:17 PM 03/20/2021    9:52 AM 03/13/2020   10:44 AM 02/18/2019   12:40 PM 11/11/2017    1:01 PM  PHQ 2/9 Scores  PHQ - 2 Score 0 0 0 0 0 0 0  PHQ- 9 Score  4 1 2  0 0 0    Fall Risk    03/25/2023   11:27 AM 09/23/2022   10:08 AM 03/21/2022    1:24 PM 03/20/2021    9:14 AM 03/13/2020   10:43 AM  Fall Risk   Falls in the past year? 1 1 0 1 0  Number falls in past yr: 1 1 0 0 0  Comment fell when door shut on toe      Injury with Fall? 1 1 0 0 0  Comment Right big toe fracture      Risk for fall due to : No Fall Risks  No Fall Risks  Medication side effect  Follow up Falls prevention discussed;Falls evaluation completed;Education provided  Falls evaluation completed  Falls evaluation completed;Falls prevention discussed    MEDICARE RISK AT HOME:  Medicare Risk at Home - 03/25/23 1128     Any stairs in or around the home? No    If so, are there  any without handrails? No    Home free of loose throw rugs in walkways, pet beds, electrical cords, etc? Yes    Adequate lighting in your home to reduce risk of falls? Yes    Life alert? No   watch   Use of a cane, walker or w/c? No    Grab bars in the bathroom? Yes    Shower chair or bench in shower? Yes    Elevated toilet seat or a handicapped toilet? No             TIMED UP AND GO:  Was the test performed?  No    Cognitive Function:    03/13/2020   10:47 AM 02/18/2019   12:45 PM 11/11/2017    1:01 PM 09/30/2016    2:19 PM  MMSE - Mini Mental State Exam  Orientation to time 5 5 5 5   Orientation to Place 5 5 5 5   Registration 3 3 3 3   Attention/ Calculation 5 0 0 0  Recall 3 3 3 3   Language- name 2 objects  0 0 0  Language- repeat 1 1 1 1   Language- follow 3 step command  0 3 3  Language- read & follow direction  0 0 0  Write a sentence  0 0 0  Copy design  0 0 0  Total score  17 20 20          03/25/2023   11:29 AM  6CIT Screen  What Year? 0 points  What month? 0 points  What time? 0 points  Count back from 20 0 points  Months in reverse 0 points  Repeat phrase 4 points  Total Score 4 points    Immunizations Immunization History  Administered Date(s) Administered   Fluad Quad(high Dose 65+) 09/18/2020   Influenza, High Dose Seasonal PF 07/07/2017   Influenza,inj,Quad PF,6+ Mos 09/08/2015, 10/07/2016, 05/20/2018   Moderna Sars-Covid-2 Vaccination 10/31/2019, 12/01/2019   Pneumococcal Conjugate-13 05/11/2014   Pneumococcal Polysaccharide-23 09/08/2015   Td 02/11/2004   Tdap 05/11/2014   Zoster Recombinant(Shingrix) 04/04/2021, 08/06/2021   Zoster, Live 06/08/2014    TDAP status: Up to date  Flu Vaccine status: Declined, Education has been provided regarding the importance of this vaccine but patient still declined. Advised may receive this vaccine at local pharmacy or Health Dept. Aware to provide a copy of the vaccination record if obtained from local pharmacy or Health Dept. Verbalized acceptance and understanding.  Pneumococcal vaccine status: Up to date  Covid-19 vaccine status: Declined, Education has been provided regarding the importance of this vaccine but patient still declined. Advised may receive this vaccine at local pharmacy or Health Dept.or vaccine clinic. Aware to provide a copy of the vaccination record if obtained from local pharmacy or Health Dept. Verbalized acceptance and understanding.  Qualifies for Shingles Vaccine? Yes   Zostavax completed Yes   Shingrix Completed?: Yes  Screening Tests Health Maintenance  Topic Date Due   Colonoscopy  10/14/2022   COVID-19 Vaccine (3 - 2023-24 season) 04/10/2023 (Originally 04/26/2022)   INFLUENZA VACCINE  03/27/2023   OPHTHALMOLOGY EXAM  04/12/2023   MAMMOGRAM  09/13/2023   HEMOGLOBIN A1C  09/20/2023   FOOT EXAM  09/24/2023   Diabetic kidney evaluation - eGFR measurement  03/19/2024    Diabetic kidney evaluation - Urine ACR  03/19/2024   Medicare Annual Wellness (AWV)  03/24/2024   DTaP/Tdap/Td (3 - Td or Tdap) 05/11/2024   Pneumonia Vaccine 54+ Years old  Completed   DEXA SCAN  Completed   Hepatitis C Screening  Completed   Zoster Vaccines- Shingrix  Completed   HPV VACCINES  Aged Out    Health Maintenance  Health Maintenance Due  Topic Date Due   Colonoscopy  10/14/2022    Colorectal cancer screening: Referral to GI placed 03/25/23. Pt aware the office will call re: appt.  Mammogram status: Completed 09/12/22. Repeat every year  Bone Density status: Completed 07/18/14. Results reflect: Bone density results: NORMAL. Repeat every 5 years.  Lung Cancer Screening: (Low Dose CT Chest recommended if Age 77-80 years, 20 pack-year currently smoking OR have quit w/in 15years.) does not qualify.   Lung Cancer Screening Referral: no  Additional Screening:  Hepatitis C Screening: does qualify; Completed 09/30/16  Vision Screening: Recommended annual ophthalmology exams for early detection of glaucoma and other disorders of the eye. Is the patient up to date with their annual eye exam?   Pt due for yearly exam and states will call for appointment. Who is the provider or what is the name of the office in which the patient attends annual eye exams? [facility] If pt is not established with a provider, would they like to be referred to a provider to establish care? Yes .   Dental Screening: Recommended annual dental exams for proper oral hygiene  Diabetic Foot Exam: Diabetic Foot Exam: Completed 09/23/22  Community Resource Referral / Chronic Care Management: CRR required this visit?  No   CCM required this visit?  No     Plan:     I have personally reviewed and noted the following in the patient's chart:   Medical and social history Use of alcohol, tobacco or illicit drugs  Current medications and supplements including opioid prescriptions. Patient is not  currently taking opioid prescriptions. Functional ability and status Nutritional status Physical activity Advanced directives List of other physicians Hospitalizations, surgeries, and ER visits in previous 12 months Vitals Screenings to include cognitive, depression, and falls Referrals and appointments  In addition, I have reviewed and discussed with patient certain preventive protocols, quality metrics, and best practice recommendations. A written personalized care plan for preventive services as well as general preventive health recommendations were provided to patient.     Maryan Puls, LPN   6/64/4034   After Visit Summary: (MyChart) Due to this being a telephonic visit, the after visit summary with patients personalized plan was offered to patient via MyChart   Nurse Notes: None

## 2023-03-26 ENCOUNTER — Encounter: Payer: PPO | Admitting: Family Medicine

## 2023-03-26 ENCOUNTER — Telehealth: Payer: Self-pay

## 2023-03-26 ENCOUNTER — Encounter (INDEPENDENT_AMBULATORY_CARE_PROVIDER_SITE_OTHER): Payer: Self-pay

## 2023-03-26 NOTE — Telephone Encounter (Signed)
Sending note to lsc support. 

## 2023-03-26 NOTE — Telephone Encounter (Signed)
Noted  

## 2023-03-26 NOTE — Telephone Encounter (Signed)
R/s pt to 8/13

## 2023-03-26 NOTE — Telephone Encounter (Signed)
Lvm asking pt to call back. Needs to r/s today's CPE (next available slot).

## 2023-04-01 ENCOUNTER — Other Ambulatory Visit: Payer: Self-pay | Admitting: Pharmacist

## 2023-04-01 NOTE — Progress Notes (Signed)
Pharmacy Quality Measure Review  This patient is appearing on report for being at risk of failing the adherence measure for Statin Use in Persons with Diabetes (SUPD) medications this calendar year.   Prescribed atorvastatin 20 mg, but had been on atorvastatin 40 mg previously. She had been splitting the 40 mg tablets in half. She notes she is due for a refill.   Will collaborate with PCP to send refill on atorvastatin, but with 20 mg tablet strength.   Catie Eppie Gibson, PharmD, BCACP, CPP Clinical Pharmacist [facility] [phone]

## 2023-04-04 MED ORDER — ATORVASTATIN CALCIUM 20 MG PO TABS: 20.0000 mg | ORAL_TABLET | Freq: Every day | ORAL | 3 refills | Status: DC

## 2023-04-04 NOTE — Progress Notes (Signed)
New Rx refilled 20mg  atorvastatin

## 2023-04-08 ENCOUNTER — Ambulatory Visit (INDEPENDENT_AMBULATORY_CARE_PROVIDER_SITE_OTHER): Payer: PPO | Admitting: Family Medicine

## 2023-04-08 ENCOUNTER — Encounter: Payer: Self-pay | Admitting: Family Medicine

## 2023-04-08 VITALS — BP 136/64 | HR 65 | Temp 97.5°F | Ht 63.25 in | Wt 184.4 lb

## 2023-04-08 DIAGNOSIS — Z1211 Encounter for screening for malignant neoplasm of colon: Secondary | ICD-10-CM

## 2023-04-08 DIAGNOSIS — G4733 Obstructive sleep apnea (adult) (pediatric): Secondary | ICD-10-CM | POA: Diagnosis not present

## 2023-04-08 DIAGNOSIS — I10 Essential (primary) hypertension: Secondary | ICD-10-CM

## 2023-04-08 DIAGNOSIS — K219 Gastro-esophageal reflux disease without esophagitis: Secondary | ICD-10-CM

## 2023-04-08 DIAGNOSIS — E785 Hyperlipidemia, unspecified: Secondary | ICD-10-CM

## 2023-04-08 DIAGNOSIS — J3089 Other allergic rhinitis: Secondary | ICD-10-CM

## 2023-04-08 DIAGNOSIS — Z8551 Personal history of malignant neoplasm of bladder: Secondary | ICD-10-CM | POA: Diagnosis not present

## 2023-04-08 DIAGNOSIS — Z0000 Encounter for general adult medical examination without abnormal findings: Secondary | ICD-10-CM

## 2023-04-08 DIAGNOSIS — E669 Obesity, unspecified: Secondary | ICD-10-CM | POA: Diagnosis not present

## 2023-04-08 DIAGNOSIS — J4530 Mild persistent asthma, uncomplicated: Secondary | ICD-10-CM | POA: Diagnosis not present

## 2023-04-08 DIAGNOSIS — E1169 Type 2 diabetes mellitus with other specified complication: Secondary | ICD-10-CM

## 2023-04-08 DIAGNOSIS — Z7189 Other specified counseling: Secondary | ICD-10-CM

## 2023-04-08 DIAGNOSIS — E118 Type 2 diabetes mellitus with unspecified complications: Secondary | ICD-10-CM

## 2023-04-08 DIAGNOSIS — F341 Dysthymic disorder: Secondary | ICD-10-CM | POA: Diagnosis not present

## 2023-04-08 MED ORDER — LORATADINE 10 MG PO TABS: 10.0000 mg | ORAL_TABLET | Freq: Every day | ORAL | 4 refills | Status: DC

## 2023-04-08 MED ORDER — TRIAMTERENE-HCTZ 37.5-25 MG PO TABS
1.0000 | ORAL_TABLET | Freq: Every day | ORAL | 4 refills | Status: DC
Start: 2023-04-08 — End: 2024-04-08

## 2023-04-08 MED ORDER — OMEPRAZOLE 40 MG PO CPDR
40.0000 mg | DELAYED_RELEASE_CAPSULE | Freq: Every day | ORAL | 4 refills | Status: DC
Start: 2023-04-08 — End: 2024-04-08

## 2023-04-08 MED ORDER — LISINOPRIL 10 MG PO TABS: 10.0000 mg | ORAL_TABLET | Freq: Every day | ORAL | 4 refills | Status: DC

## 2023-04-08 MED ORDER — BREO ELLIPTA 100-25 MCG/ACT IN AEPB: INHALATION_SPRAY | RESPIRATORY_TRACT | 0 refills | Status: DC

## 2023-04-08 MED ORDER — AMLODIPINE BESYLATE 10 MG PO TABS: 10.0000 mg | ORAL_TABLET | Freq: Every day | ORAL | 4 refills | Status: DC

## 2023-04-08 MED ORDER — FLUOXETINE HCL 10 MG PO CAPS: 10.0000 mg | ORAL_CAPSULE | Freq: Every day | ORAL | 4 refills | Status: DC

## 2023-04-08 MED ORDER — METFORMIN HCL ER 500 MG PO TB24: 500.0000 mg | ORAL_TABLET | Freq: Every day | ORAL | 4 refills | Status: DC

## 2023-04-08 MED ORDER — ATORVASTATIN CALCIUM 20 MG PO TABS: 20.0000 mg | ORAL_TABLET | Freq: Every day | ORAL | 3 refills | Status: DC

## 2023-04-08 NOTE — Assessment & Plan Note (Signed)
Preventative protocols reviewed and updated unless pt declined. Discussed healthy diet and lifestyle.  

## 2023-04-08 NOTE — Assessment & Plan Note (Addendum)
Chronic, stable period on low dose prozac - desires to continue this.

## 2023-04-08 NOTE — Assessment & Plan Note (Signed)
Encourage healthy diet and lifestyle choices to affect sustainable weight loss.  

## 2023-04-08 NOTE — Assessment & Plan Note (Signed)
Chronic, stable on daily omeprazole 40mg 

## 2023-04-08 NOTE — Assessment & Plan Note (Signed)
Chronic, on CPAP. Previously saw pulm, last seen 2017.  She may need new machine parts - will check with [employer] supplier and let us know if needs referral for this.

## 2023-04-08 NOTE — Assessment & Plan Note (Signed)
Continue yearly urology follow up [name])

## 2023-04-08 NOTE — Assessment & Plan Note (Signed)
Chronic, stable on statin - continue current regimen.  The 10-year ASCVD risk score (Arnett DK, et al., 2019) is: 33.2%   Values used to calculate the score:     Age: 72 years     Sex: Female     Is Non-Hispanic African American: No     Diabetic: Yes     Tobacco smoker: No     Systolic Blood Pressure: 136 mmHg     Is BP treated: Yes     HDL Cholesterol: 51.9 mg/dL     Total Cholesterol: 174 mg/dL

## 2023-04-08 NOTE — Assessment & Plan Note (Signed)
Chronic, continue breo controller inhaler with PRN albuterol rescue inhaler.

## 2023-04-08 NOTE — Assessment & Plan Note (Signed)
Chronic, stable on current regimen - continue. 

## 2023-04-08 NOTE — Patient Instructions (Addendum)
If interested, check with local pharmacy about RSV vaccine.  Medicines refilled.  Let us know when if you need referral for new home sleep study test.  Good tos see ou today  Return as needed or in 1 year for next physical.

## 2023-04-08 NOTE — Assessment & Plan Note (Signed)
Continue daily antihistamine and flonase

## 2023-04-08 NOTE — Assessment & Plan Note (Signed)
Chronic, stable on low dose metformin XR - which she tolerates better than IR form

## 2023-04-08 NOTE — Progress Notes (Signed)
Ph: [phone] Fax: [phone]   Patient ID: Jennifer Pineda, female    DOB: 18-Jan-1951, 72 y.o.   MRN: [id number]  This visit was conducted in person.  BP 136/64   Pulse 65   Temp (!) 97.5 F (36.4 C) (Temporal)   Ht 5' 3.25" (1.607 m)   Wt 184 lb 6 oz (83.6 kg)   SpO2 98%   BMI 32.40 kg/m    CC: CPE Subjective:   HPI: Jennifer Pineda is a 72 y.o. female presenting on 04/08/2023 for Annual Exam (MCR prt 2 [AWV- 03/25/23]. Pt states she recently moved to [place], [place]. )   Now living in [place], [place] to be closer to family.  Brother with liver cancer not doing well. Sister with 4v CABG last year.  Most of her family lives there.   Saw health advisor last month for medicare wellness visit. Note reviewed.   No results found.  Flowsheet Row Clinical Support from 03/25/2023 in [facility] [place]  PHQ-2 Total Score 0          03/25/2023   11:27 AM 09/23/2022   10:08 AM 03/21/2022    1:24 PM 03/20/2021    9:14 AM 03/13/2020   10:43 AM  Fall Risk   Falls in the past year? 1 1 0 1 0  Number falls in past yr: 1 1 0 0 0  Comment fell when door shut on toe      Injury with Fall? 1 1 0 0 0  Comment Right big toe fracture      Risk for fall due to : No Fall Risks  No Fall Risks  Medication side effect  Follow up Falls prevention discussed;Falls evaluation completed;Education provided  Falls evaluation completed  Falls evaluation completed;Falls prevention discussed     H/o bladder cancer s/p TURBT 07/2015. Sees urology yearly Jennifer Pineda) with yearly surveillance cystoscopy, upcoming appt 05/2023.   OSA on CPAP through [facility] - trouble with loose mask straps, previously followed by Dr Sung Amabile. Needs new equipment.  Chronic cough managed with breo inhaler.   DM on metformin XR - doing well with this, tolerating better than IR formulation.   HLD - continues atorvastatin 20mg  daily, unable to tolerate 40mg  dose.   Strong fmhx CAD/MI  (sister, brother, father, grandparents). Sister had 4v bypass 12/2021. She had reassuring cardiac evaluation 2023.    Preventative: COLONOSCOPY Date: 09/2012 small int hem Jennifer Pineda) rpt 10 yrs. Requests Cologuard.  Well woman - last pap was 2010, all normal. Hysterectomy age 65yo for endometriosis/pelvic pain. Ovaries remain.  Mammogram - 08/2022 Birads1 @ [facility]  DEXA 06/2014 - WNL Lung cancer screening - not eligible Flu - yearly COVID vaccine - Moderna 10/2019, 11/2019, no booster Prevnar-13 2015, pneumovax23 08/2015 Td 2005, Tdap 2015 zostavax - 2015  shingrix - 03/2021, 07/2021 RSV - discussed, to consider  Advanced directives - in chart 08/2015. Would want son Jennifer Pineda) to be HCPOA. No prolonged life support if terminal condition. Full code, ok with temporary ventilation.  Seat belt use discussed Sunscreen use discussed. No changing moles on skin. Sees derm s/p BCC removed 2021 Sleep - averaging 6 hours/night due to dogs waking her up Ex smoker - quit 1988 Alcohol - 1-2 glasses wine per week  Eye exam yearly - h/o macular degeneration  Dentist q6 mo  Bowel - no constipation  Bladder - some urge incontinence, nocturia x3-4   Daughter Jennifer Pineda lives with her  Children: 2  children, one daughter lives with mother, son lives with father Activity: 20 min walk daily Diet: good water, fruits/vegetables daily.      Relevant past medical, surgical, family and social history reviewed and updated as indicated. Interim medical history since our last visit reviewed. Allergies and medications reviewed and updated. Outpatient Medications Prior to Visit  Medication Sig Dispense Refill   albuterol (VENTOLIN HFA) 108 (90 Base) MCG/ACT inhaler Inhale 2 puffs into the lungs every 6 (six) hours as needed. For shortness of breath. 18 g 6   aspirin EC 81 MG tablet Take 1 tablet (81 mg total) by mouth daily. Swallow whole.     Blood Glucose Monitoring Suppl (ONE TOUCH ULTRA SYSTEM KIT)  w/Device KIT 1 kit by Does not apply route once. 1 each 0   Coenzyme Q10 100 MG capsule Take 1 capsule (100 mg total) by mouth daily.     fluticasone (CUTIVATE) 0.05 % cream Apply topically. For lips     fluticasone (FLONASE) 50 MCG/ACT nasal spray Use 2 spray(s) in each nostril once daily 48 g 3   glucose blood (ONE TOUCH ULTRA TEST) test strip 1 each by Other route daily. Use to check sugar once daily and as needed. Dx: E11.9 100 each 3   Multiple Vitamins-Calcium (VIACTIV MULTI-VITAMIN) CHEW Chew 1 tablet by mouth daily.     Multiple Vitamins-Minerals (PRESERVISION AREDS PO) Take by mouth at bedtime.     ONETOUCH DELICA LANCETS FINE MISC Use as directed - check as needed or 3 times weekly 100 each 1   valACYclovir (VALTREX) 1000 MG tablet SMARTSIG:2 Tablet(s) By Mouth Every 12 Hours     amLODipine (NORVASC) 10 MG tablet Take 1 tablet by mouth once daily 90 tablet 0   atorvastatin (LIPITOR) 20 MG tablet Take 1 tablet (20 mg total) by mouth daily. 90 tablet 3   BREO ELLIPTA 100-25 MCG/ACT AEPB Inhale 1 puff by mouth once daily 180 each 0   FLUoxetine (PROZAC) 10 MG capsule Take 1 capsule by mouth once daily 90 capsule 0   lisinopril (ZESTRIL) 10 MG tablet Take 1 tablet by mouth once daily 90 tablet 0   loratadine (CLARITIN) 10 MG tablet Take 1 tablet by mouth once daily 90 tablet 1   metFORMIN (GLUCOPHAGE-XR) 500 MG 24 hr tablet Take 1 tablet by mouth once daily with breakfast 90 tablet 0   omeprazole (PRILOSEC) 40 MG capsule Take 1 capsule (40 mg total) by mouth daily. 90 capsule 3   triamterene-hydrochlorothiazide (MAXZIDE-25) 37.5-25 MG tablet Take 1 tablet by mouth once daily 90 tablet 0   fluticasone (CUTIVATE) 0.005 % ointment Apply topically daily as needed.     No facility-administered medications prior to visit.     Per HPI unless specifically indicated in ROS section below Review of Systems  Constitutional:  Negative for activity change, appetite change, chills, fatigue, fever and  unexpected weight change.  HENT:  Negative for hearing loss.   Eyes:  Negative for visual disturbance.  Respiratory:  Negative for cough, chest tightness, shortness of breath and wheezing.   Cardiovascular:  Negative for chest pain, palpitations and leg swelling.  Gastrointestinal:  Positive for diarrhea (intermittent). Negative for abdominal distention, abdominal pain, blood in stool, constipation, nausea and vomiting.  Genitourinary:  Negative for difficulty urinating and hematuria.  Musculoskeletal:  Negative for arthralgias, myalgias and neck pain.  Skin:  Negative for rash.  Neurological:  Negative for dizziness, seizures, syncope and headaches.  Hematological:  Negative for adenopathy. Does  not bruise/bleed easily.  Psychiatric/Behavioral:  Negative for dysphoric mood. The patient is not nervous/anxious.     Objective:  BP 136/64   Pulse 65   Temp (!) 97.5 F (36.4 C) (Temporal)   Ht 5' 3.25" (1.607 m)   Wt 184 lb 6 oz (83.6 kg)   SpO2 98%   BMI 32.40 kg/m   Wt Readings from Last 3 Encounters:  04/08/23 184 lb 6 oz (83.6 kg)  03/25/23 185 lb (83.9 kg)  09/23/22 183 lb 6 oz (83.2 kg)      Physical Exam Vitals and nursing note reviewed.  Constitutional:      Appearance: Normal appearance. She is not ill-appearing.  HENT:     Head: Normocephalic and atraumatic.     Right Ear: Tympanic membrane, ear canal and external ear normal. There is no impacted cerumen.     Left Ear: Tympanic membrane, ear canal and external ear normal. There is no impacted cerumen.     Mouth/Throat:     Mouth: Mucous membranes are moist.     Pharynx: Oropharynx is clear. No oropharyngeal exudate or posterior oropharyngeal erythema.  Eyes:     General:        Right eye: No discharge.        Left eye: No discharge.     Extraocular Movements: Extraocular movements intact.     Conjunctiva/sclera: Conjunctivae normal.     Pupils: Pupils are equal, round, and reactive to light.  Neck:     Thyroid:  No thyroid mass or thyromegaly.     Vascular: No carotid bruit.  Cardiovascular:     Rate and Rhythm: Normal rate and regular rhythm.     Pulses: Normal pulses.     Heart sounds: Normal heart sounds. No murmur heard. Pulmonary:     Effort: Pulmonary effort is normal. No respiratory distress.     Breath sounds: Normal breath sounds. No wheezing, rhonchi or rales.  Abdominal:     General: Bowel sounds are normal. There is no distension.     Palpations: Abdomen is soft. There is no mass.     Tenderness: There is no abdominal tenderness. There is no guarding or rebound.     Hernia: No hernia is present.  Musculoskeletal:     Cervical back: Normal range of motion and neck supple. No rigidity.     Right lower leg: No edema.     Left lower leg: No edema.  Lymphadenopathy:     Cervical: No cervical adenopathy.  Skin:    General: Skin is warm and dry.     Findings: Bruising (R elbow) present. No rash.  Neurological:     General: No focal deficit present.     Mental Status: She is alert. Mental status is at baseline.  Psychiatric:        Mood and Affect: Mood normal.        Behavior: Behavior normal.       Results for orders placed or performed in visit on 03/20/23  Comprehensive metabolic panel  Result Value Ref Range   Sodium 139 135 - 145 mEq/L   Potassium 4.5 3.5 - 5.1 mEq/L   Chloride 101 96 - 112 mEq/L   CO2 30 19 - 32 mEq/L   Glucose, Bld 129 (H) 70 - 99 mg/dL   BUN 19 6 - 23 mg/dL   Creatinine, Ser 2.13 0.40 - 1.20 mg/dL   Total Bilirubin 0.4 0.2 - 1.2 mg/dL   Alkaline Phosphatase 75 39 -  117 U/L   AST 11 0 - 37 U/L   ALT 12 0 - 35 U/L   Total Protein 7.6 6.0 - 8.3 g/dL   Albumin 4.3 3.5 - 5.2 g/dL   GFR 23.76 >28.31 mL/min   Calcium 9.7 8.4 - 10.5 mg/dL  Lipid panel  Result Value Ref Range   Cholesterol 174 0 - 200 mg/dL   Triglycerides 517.6 (H) 0.0 - 149.0 mg/dL   HDL 16.07 >37.10 mg/dL   VLDL 62.6 0.0 - 94.8 mg/dL   LDL Cholesterol 90 0 - 99 mg/dL   Total  CHOL/HDL Ratio 3    NonHDL 122.31   Microalbumin / creatinine urine ratio  Result Value Ref Range   Microalb, Ur 1.2 0.0 - 1.9 mg/dL   Creatinine,U 54.6 mg/dL   Microalb Creat Ratio 1.3 0.0 - 30.0 mg/g  Hemoglobin A1c  Result Value Ref Range   Hgb A1c MFr Bld 6.4 4.6 - 6.5 %    Assessment & Plan:   Problem List Items Addressed This Visit     Health maintenance examination - Primary (Chronic)    Preventative protocols reviewed and updated unless pt declined. Discussed healthy diet and lifestyle.       Advanced care planning/counseling discussion (Chronic)    Previously discussed      Hyperlipidemia associated with type 2 diabetes mellitus (HCC)    Chronic, stable on statin - continue current regimen.  The 10-year ASCVD risk score (Arnett DK, et al., 2019) is: 33.2%   Values used to calculate the score:     Age: 35 years     Sex: Female     Is Non-Hispanic African American: No     Diabetic: Yes     Tobacco smoker: No     Systolic Blood Pressure: 136 mmHg     Is BP treated: Yes     HDL Cholesterol: 51.9 mg/dL     Total Cholesterol: 174 mg/dL       Relevant Medications   amLODipine (NORVASC) 10 MG tablet   atorvastatin (LIPITOR) 20 MG tablet   lisinopril (ZESTRIL) 10 MG tablet   metFORMIN (GLUCOPHAGE-XR) 500 MG 24 hr tablet   triamterene-hydrochlorothiazide (MAXZIDE-25) 37.5-25 MG tablet   ANXIETY DEPRESSION    Chronic, stable period on low dose prozac - desires to continue this.       Relevant Medications   FLUoxetine (PROZAC) 10 MG capsule   Obstructive sleep apnea    Chronic, on CPAP. Previously saw pulm, last seen 2017.  She may need new machine parts - will check with [facility] D[facility] supplier and let us know if needs referral for this.       Essential hypertension    Chronic, stable on current regimen - continue.       Relevant Medications   amLODipine (NORVASC) 10 MG tablet   atorvastatin (LIPITOR) 20 MG tablet   lisinopril (ZESTRIL) 10 MG tablet    triamterene-hydrochlorothiazide (MAXZIDE-25) 37.5-25 MG tablet   Allergic rhinitis    Continue daily antihistamine and flonase.       GERD    Chronic, stable on daily omeprazole 40mg        Relevant Medications   omeprazole (PRILOSEC) 40 MG capsule   Diabetes mellitus type 2 with complications (HCC)    Chronic, stable on low dose metformin XR - which she tolerates better than IR form      Relevant Medications   atorvastatin (LIPITOR) 20 MG tablet   lisinopril (ZESTRIL) 10 MG tablet  metFORMIN (GLUCOPHAGE-XR) 500 MG 24 hr tablet   Persistent asthma without complication    Chronic, continue breo controller inhaler with PRN albuterol rescue inhaler.       Relevant Medications   BREO ELLIPTA 100-25 MCG/ACT AEPB   History of bladder cancer    Continue yearly urology follow up ([place])       Obesity, Class I, BMI 30-34.9    Encourage healthy diet and lifestyle choices to affect sustainable weight loss.       Other Visit Diagnoses     Special screening for malignant neoplasms, colon       Relevant Orders   Cologuard        Meds ordered this encounter  Medications   amLODipine (NORVASC) 10 MG tablet    Sig: Take 1 tablet (10 mg total) by mouth daily.    Dispense:  90 tablet    Refill:  4   atorvastatin (LIPITOR) 20 MG tablet    Sig: Take 1 tablet (20 mg total) by mouth daily.    Dispense:  90 tablet    Refill:  3   BREO ELLIPTA 100-25 MCG/ACT AEPB    Sig: Inhale 1 puff by mouth once daily    Dispense:  180 each    Refill:  0   FLUoxetine (PROZAC) 10 MG capsule    Sig: Take 1 capsule (10 mg total) by mouth daily.    Dispense:  90 capsule    Refill:  4   lisinopril (ZESTRIL) 10 MG tablet    Sig: Take 1 tablet (10 mg total) by mouth daily.    Dispense:  90 tablet    Refill:  4   metFORMIN (GLUCOPHAGE-XR) 500 MG 24 hr tablet    Sig: Take 1 tablet (500 mg total) by mouth daily with breakfast.    Dispense:  90 tablet    Refill:  4   omeprazole (PRILOSEC) 40 MG  capsule    Sig: Take 1 capsule (40 mg total) by mouth daily.    Dispense:  90 capsule    Refill:  4   triamterene-hydrochlorothiazide (MAXZIDE-25) 37.5-25 MG tablet    Sig: Take 1 tablet by mouth daily.    Dispense:  90 tablet    Refill:  4   loratadine (CLARITIN) 10 MG tablet    Sig: Take 1 tablet (10 mg total) by mouth daily.    Dispense:  90 tablet    Refill:  4    Orders Placed This Encounter  Procedures   Cologuard    Patient Instructions  If interested, check with local pharmacy about RSV vaccine.  Medicines refilled.  Let us know when if you need referral for new home sleep study test.  Good tos see ou today  Return as needed or in 1 year for next physical.   Follow up plan: Return in about 1 year (around 04/07/2024) for annual exam, prior fasting for blood work, medicare wellness visit.  Eustaquio Boyden, MD

## 2023-04-08 NOTE — Assessment & Plan Note (Signed)
Previously discussed.

## 2023-04-08 NOTE — Assessment & Plan Note (Deleted)

## 2023-04-15 DIAGNOSIS — Z1211 Encounter for screening for malignant neoplasm of colon: Secondary | ICD-10-CM | POA: Diagnosis not present

## 2023-04-22 LAB — COLOGUARD: COLOGUARD: NEGATIVE

## 2023-05-26 DIAGNOSIS — E785 Hyperlipidemia, unspecified: Secondary | ICD-10-CM | POA: Insufficient documentation

## 2023-05-27 DIAGNOSIS — G4733 Obstructive sleep apnea (adult) (pediatric): Secondary | ICD-10-CM | POA: Diagnosis not present

## 2023-05-28 DIAGNOSIS — Z8551 Personal history of malignant neoplasm of bladder: Secondary | ICD-10-CM | POA: Diagnosis not present

## 2023-06-27 DIAGNOSIS — G4733 Obstructive sleep apnea (adult) (pediatric): Secondary | ICD-10-CM | POA: Diagnosis not present

## 2023-07-14 ENCOUNTER — Encounter: Payer: Self-pay | Admitting: Family Medicine

## 2023-07-14 DIAGNOSIS — J3089 Other allergic rhinitis: Secondary | ICD-10-CM

## 2023-07-14 MED ORDER — FLUTICASONE PROPIONATE 50 MCG/ACT NA SUSP
2.0000 | Freq: Every day | NASAL | 0 refills | Status: DC
Start: 2023-07-14 — End: 2023-09-01

## 2023-07-14 NOTE — Telephone Encounter (Signed)
E-scribed 30-day Flonase rx to [facility], [place], [place], per pt request.

## 2023-07-21 DIAGNOSIS — K080 Exfoliation of teeth due to systemic causes: Secondary | ICD-10-CM | POA: Diagnosis not present

## 2023-07-27 DIAGNOSIS — G4733 Obstructive sleep apnea (adult) (pediatric): Secondary | ICD-10-CM | POA: Diagnosis not present

## 2023-07-29 DIAGNOSIS — K080 Exfoliation of teeth due to systemic causes: Secondary | ICD-10-CM | POA: Diagnosis not present

## 2023-08-11 DIAGNOSIS — H5203 Hypermetropia, bilateral: Secondary | ICD-10-CM | POA: Diagnosis not present

## 2023-08-11 DIAGNOSIS — E119 Type 2 diabetes mellitus without complications: Secondary | ICD-10-CM | POA: Diagnosis not present

## 2023-08-17 ENCOUNTER — Encounter: Payer: Self-pay | Admitting: Pharmacist

## 2023-08-17 NOTE — Progress Notes (Signed)
Pharmacy Quality Measure Review  This patient is appearing on a report for being at risk of failing the adherence measure for diabetes and hypertension (ACEi/ARB) medications this calendar year.   Medication: lisinopril 10 mg Last fill date: 11/22 for 90 day supply  Medication: metformin XR 500 mg Last fill date: 11/18 for 90 day supply  Insurance report was not up to date. No action needed at this time.   Jarrett Ables, PharmD PGY-1 Pharmacy Resident

## 2023-08-31 ENCOUNTER — Other Ambulatory Visit: Payer: Self-pay | Admitting: Family Medicine

## 2023-08-31 DIAGNOSIS — J3089 Other allergic rhinitis: Secondary | ICD-10-CM

## 2023-09-22 ENCOUNTER — Other Ambulatory Visit: Payer: Self-pay | Admitting: Family Medicine

## 2023-09-22 DIAGNOSIS — J4530 Mild persistent asthma, uncomplicated: Secondary | ICD-10-CM

## 2023-09-26 ENCOUNTER — Telehealth: Payer: Self-pay

## 2023-09-26 DIAGNOSIS — J4530 Mild persistent asthma, uncomplicated: Secondary | ICD-10-CM

## 2023-09-26 DIAGNOSIS — J3089 Other allergic rhinitis: Secondary | ICD-10-CM

## 2023-09-26 NOTE — Telephone Encounter (Signed)
Copied from CRM [id number]. Topic: Clinical - Prescription Issue >> Sep 26, 2023  3:13 PM Fuller Mandril wrote: Reason for CRM: Patient called states BREO ELLIPTA 100-25 MCG/ACT AEPB is now $385 for a one month supply and she would like to know why. States she spoke with the phamracy but not her insurance. Pharmacy did try [facility] but did not lower price. She would like to know what provider suggest because she is not able to afford that price. Thank You

## 2023-09-29 NOTE — Telephone Encounter (Signed)
Spoke with pt suggesting she contact her insurance co to see what med in the [employer] "family" would be more cost effective for her and let us know. Pt verbalizes understanding and will let us know what she finds out.

## 2023-10-01 MED ORDER — FLUTICASONE PROPIONATE 50 MCG/ACT NA SUSP
2.0000 | Freq: Every day | NASAL | 3 refills | Status: DC
Start: 2023-10-01 — End: 2024-04-08

## 2023-10-01 MED ORDER — FLUTICASONE PROPIONATE 50 MCG/ACT NA SUSP
2.0000 | Freq: Every day | NASAL | 1 refills | Status: DC
Start: 2023-10-01 — End: 2023-10-01

## 2023-10-01 MED ORDER — BREO ELLIPTA 100-25 MCG/ACT IN AEPB
INHALATION_SPRAY | RESPIRATORY_TRACT | 3 refills | Status: DC
Start: 2023-10-01 — End: 2024-04-08

## 2023-10-01 NOTE — Addendum Note (Signed)
 Addended by: Claire Crick on: 10/01/2023 02:27 PM   Modules accepted: Orders

## 2023-10-01 NOTE — Telephone Encounter (Signed)
 90d supply of breo and flonase  sent to pharmacy.

## 2023-10-01 NOTE — Telephone Encounter (Signed)
 Copied from CRM [id number]. Topic: General - Other >> Oct 01, 2023 10:21 AM Marlan Silva wrote: Reason for CRM: Patient called in stating that she has to tell Dr. Mariam Shingles what her insurance response was. Patient is requesting a call back from his nurse.

## 2023-10-01 NOTE — Addendum Note (Signed)
 Addended by: Skylor Schnapp on: 10/01/2023 12:58 PM   Modules accepted: Orders

## 2023-10-01 NOTE — Telephone Encounter (Signed)
 Rtn pt's call. Pt states per her insurance, she has to meet the deductible, then [facility] will be $45.00. Says she will pay deductible, however, she requests 90-day supply.   Also, requests 90-day rx for Flonase . E-scribed rx.

## 2023-10-30 ENCOUNTER — Other Ambulatory Visit: Payer: Self-pay | Admitting: Family Medicine

## 2023-10-30 DIAGNOSIS — Z1231 Encounter for screening mammogram for malignant neoplasm of breast: Secondary | ICD-10-CM

## 2023-11-10 ENCOUNTER — Ambulatory Visit
Admission: RE | Admit: 2023-11-10 | Discharge: 2023-11-10 | Disposition: A | Discharge status: Home / Self Care | Source: Ambulatory Visit | Attending: Family Medicine | Admitting: Family Medicine

## 2023-11-10 DIAGNOSIS — Z1231 Encounter for screening mammogram for malignant neoplasm of breast: Secondary | ICD-10-CM | POA: Insufficient documentation

## 2023-12-24 DIAGNOSIS — L570 Actinic keratosis: Secondary | ICD-10-CM | POA: Diagnosis not present

## 2023-12-24 DIAGNOSIS — L814 Other melanin hyperpigmentation: Secondary | ICD-10-CM | POA: Diagnosis not present

## 2023-12-24 DIAGNOSIS — D225 Melanocytic nevi of trunk: Secondary | ICD-10-CM | POA: Diagnosis not present

## 2023-12-24 DIAGNOSIS — L821 Other seborrheic keratosis: Secondary | ICD-10-CM | POA: Diagnosis not present

## 2024-01-25 DIAGNOSIS — E119 Type 2 diabetes mellitus without complications: Secondary | ICD-10-CM | POA: Diagnosis not present

## 2024-01-27 ENCOUNTER — Ambulatory Visit: Payer: Self-pay

## 2024-01-27 NOTE — Telephone Encounter (Signed)
 Reason for Disposition . Earache  (Exceptions: brief ear pain of < 60 minutes duration, earache occurring during air travel  Answer Assessment - Initial Assessment Questions 1. LOCATION: "Which ear is involved?"     Left ear pain- behind the ear- not in the ear 2. ONSET: "When did the ear start hurting"      2 months- comes and goes- more frequent in last 2 weeks 3. SEVERITY: "How bad is the pain?"  (Scale 1-10; mild, moderate or severe)   - MILD (1-3): doesn't interfere with normal activities    - MODERATE (4-7): interferes with normal activities or awakens from sleep    - SEVERE (8-10): excruciating pain, unable to do any normal activities      Lasting 5 minutes or less, usually mild 4. URI SYMPTOMS: "Do you have a runny nose or cough?"     no 5. FEVER: "Do you have a fever?" If Yes, ask: "What is your temperature, how was it measured, and when did it start?"     no 6. CAUSE: "Have you been swimming recently?", "How often do you use Q-TIPS?", "Have you had any recent air travel or scuba diving?"     unsure 7. OTHER SYMPTOMS: "Do you have any other symptoms?" (e.g., headache, stiff neck, dizziness, vomiting, runny nose, decreased hearing)     Headache, thrush- without symptoms, diarrhea  Protocols used: Earache-A-AH

## 2024-01-27 NOTE — Telephone Encounter (Addendum)
 Several complaints- ear pain, thrush, diabetes concerns- fasting level is up Chief Complaint: ear pain behind the ear Symptoms: pain can be severe- comes and goes- behind the ear Frequency: started 2 months ago Pertinent Negatives: Patient denies pain inner ear, fever Disposition: [] ED /[] Urgent Care (no appt availability in office) / [x] Appointment(In office/virtual)/ []  [facility]/ [] Home Care/ [] Refused Recommended Disposition /[] [facility]/ []  Follow-up with PCP Additional Notes: Patient has multiple complaints- her most concerning is the ear pain that has increased in pain and frequency.

## 2024-01-27 NOTE — Telephone Encounter (Signed)
 Pt initially scheduled on 01/29/24 at 11:30. However, Dr Crissie Dome is now showing an opening at 3:30 tomorrow.   Spoke with pt offering to r/s OV to 01/28/24 at 3:30. Pt accepted offer and OV was r/s. Pt expresses her thanks.

## 2024-01-27 NOTE — Telephone Encounter (Signed)
  Copied From CRM [id number]. Reason for Triage: patient calling in patient tongue is white possible thrush,  behind left ear sharp blast of pain lasting sometimes 5 minutes goes to dull ache for rest of the day. This is happening more frequently  Patient phone [phone]    First attempt; left vm.

## 2024-01-28 ENCOUNTER — Ambulatory Visit: Payer: Self-pay | Admitting: Family Medicine

## 2024-01-28 ENCOUNTER — Encounter: Payer: Self-pay | Admitting: Family Medicine

## 2024-01-28 ENCOUNTER — Ambulatory Visit (INDEPENDENT_AMBULATORY_CARE_PROVIDER_SITE_OTHER): Admitting: Family Medicine

## 2024-01-28 VITALS — BP 164/68 | HR 68 | Temp 98.7°F | Ht 63.25 in | Wt 183.1 lb

## 2024-01-28 DIAGNOSIS — E118 Type 2 diabetes mellitus with unspecified complications: Secondary | ICD-10-CM | POA: Diagnosis not present

## 2024-01-28 DIAGNOSIS — Z8551 Personal history of malignant neoplasm of bladder: Secondary | ICD-10-CM | POA: Diagnosis not present

## 2024-01-28 DIAGNOSIS — R5382 Chronic fatigue, unspecified: Secondary | ICD-10-CM | POA: Diagnosis not present

## 2024-01-28 DIAGNOSIS — E538 Deficiency of other specified B group vitamins: Secondary | ICD-10-CM

## 2024-01-28 DIAGNOSIS — H9202 Otalgia, left ear: Secondary | ICD-10-CM | POA: Diagnosis not present

## 2024-01-28 DIAGNOSIS — R8290 Unspecified abnormal findings in urine: Secondary | ICD-10-CM

## 2024-01-28 DIAGNOSIS — I10 Essential (primary) hypertension: Secondary | ICD-10-CM

## 2024-01-28 LAB — POC URINALSYSI DIPSTICK (AUTOMATED)
Bilirubin, UA: NEGATIVE
Blood, UA: NEGATIVE
Glucose, UA: NEGATIVE
Ketones, UA: NEGATIVE
Leukocytes, UA: NEGATIVE
Nitrite, UA: NEGATIVE
Protein, UA: NEGATIVE
Spec Grav, UA: 1.015 (ref 1.010–1.025)
Urobilinogen, UA: 0.2 U/dL
pH, UA: 6 (ref 5.0–8.0)

## 2024-01-28 LAB — POCT GLYCOSYLATED HEMOGLOBIN (HGB A1C): Hemoglobin A1C: 6.5 % — AB (ref 4.0–5.6)

## 2024-01-28 MED ORDER — LISINOPRIL 20 MG PO TABS: 20.0000 mg | ORAL_TABLET | Freq: Every day | ORAL | 1 refills | Status: DC

## 2024-01-28 NOTE — Patient Instructions (Addendum)
 VISIT SUMMARY: Today, we discussed your left ear pain, blood pressure, diabetes, fatigue, asthma, and urinary symptoms. We reviewed your current medications and made some adjustments to better manage your conditions.  YOUR PLAN: -LEFT EAR PAIN: Your left ear pain may be due to nerve issue. We will monitor your symptoms and consider imaging vs nerve pain medicine if the pain continues or worsens.  -HYPERTENSION: Your blood pressure is higher than desired. We will increase your lisinopril  dose to 20 mg daily and continue your current dose of Maxzide. Please monitor your blood pressure regularly and be aware of potential side effects like dry cough or swelling.  -DIABETES MELLITUS TYPE 2: Your diabetes is well-controlled with an A1c of 6.5%. Continue taking metformin  XR as prescribed. We will also check your vitamin B12 levels.  -FATIGUE: Your persistent fatigue may be due to thyroid  issues or a vitamin B12 deficiency. We will order tests to check your thyroid  function and vitamin B12 levels.  -ASTHMA: Your asthma is well-managed with Breo. Remember to rinse your mouth after using Breo to prevent thrush. You might find it helpful to time your Breo use with brushing your teeth.  -URINARY SYMPTOMS: You have noticed a bad smell in your urine. We will order a urine test to rule out any infection. If the symptoms continue, we may refer you back to your urologist in history of bladder cancer.  INSTRUCTIONS: Please follow up with the recommended tests for thyroid  function and vitamin B12 levels and other labwork ordered today. Urinalysis was also checked today. Continue monitoring your blood pressure and blood sugar levels regularly. If your symptoms persist or worsen, contact our office for further evaluation.

## 2024-01-28 NOTE — Progress Notes (Signed)
 Ph: [phone] Fax: [phone]   Patient ID: Jennifer Pineda, female    DOB: [birth year], 73 y.o.   MRN: [id number]  This visit was conducted in person.  BP (!) 164/68   Pulse 68   Temp 98.7 F (37.1 C) (Oral)   Ht 5' 3.25" (1.607 m)   Wt 183 lb 2 oz (83.1 kg)   SpO2 96%   BMI 32.18 kg/m   Similar on repeat testing  Chief Complaint  Patient presents with   Ear Pain    C/o off and on L ear pain- just behind ear. Started about 2 mo ago.     Subjective:   Discussed the use of AI scribe software for clinical note transcription with the patient, who gave verbal consent to proceed.  History of Present Illness   Jennifer Pineda is a 73 year old female with hypertension, sleep apnea, and diabetes who presents with left ear pain.  She experiences sharp, severe left ear pain behind the ear for about two months, occurring intermittently with three episodes total. The most recent episode lasted five minutes and caused immobility due to intensity. There is no radiation, hearing changes, tinnitus, nausea, dizziness, or drainage. No headaches otherwise, no vision change including no double vision or blurred vision.   She has persistent fatigue for over two months, along with a white tongue and halitosis. She takes metformin  XR daily for diabetes, with blood sugar levels consistently above 100 mg/dL, including a reading of 130 mg/dL at 6:57 AM today.  Her blood pressure fluctuates, often in the 130s or higher, with a reading of 164/68 mmHg today. She takes lisinopril  10 mg and Maxzide daily without missed doses, yet her blood pressure remains elevated.  She notes a bad smell in her urine intermittently for about two months, without dysuria, urgency, or frequency changes. Nocturia occurs two to four times nightly. She has a history of bladder cancer, with a normal urologist visit in October.  She has asthma and uses Breo daily, sometimes forgetting to rinse her mouth, which may  contribute to her white tongue. No current respiratory symptoms, but she had a cough, sneezing, and sore throat from January to March, resolved with allergy medications.           Relevant past medical, surgical, family and social history reviewed and updated as indicated. Interim medical history since our last visit reviewed. Allergies and medications reviewed and updated. Outpatient Medications Prior to Visit  Medication Sig Dispense Refill   albuterol  (VENTOLIN  HFA) 108 (90 Base) MCG/ACT inhaler Inhale 2 puffs into the lungs every 6 (six) hours as needed. For shortness of breath. 18 g 6   amLODipine  (NORVASC ) 10 MG tablet Take 1 tablet (10 mg total) by mouth daily. 90 tablet 4   aspirin  EC 81 MG tablet Take 1 tablet (81 mg total) by mouth daily. Swallow whole.     atorvastatin  (LIPITOR) 20 MG tablet Take 1 tablet (20 mg total) by mouth daily. 90 tablet 3   Blood Glucose Monitoring Suppl (ONE TOUCH ULTRA SYSTEM KIT) w/Device KIT 1 kit by Does not apply route once. 1 each 0   BREO ELLIPTA  100-25 MCG/ACT AEPB Inhale 1 puff by mouth once daily 180 each 3   FLUoxetine  (PROZAC ) 10 MG capsule Take 1 capsule (10 mg total) by mouth daily. 90 capsule 4   fluticasone  (CUTIVATE ) 0.05 % cream Apply topically. For lips     fluticasone  (FLONASE ) 50 MCG/ACT nasal spray Place 2 sprays  into both nostrils daily. 48 g 3   glucose blood (ONE TOUCH ULTRA TEST) test strip 1 each by Other route daily. Use to check sugar once daily and as needed. Dx: E11.9 100 each 3   loratadine  (CLARITIN ) 10 MG tablet Take 1 tablet (10 mg total) by mouth daily. 90 tablet 4   metFORMIN  (GLUCOPHAGE -XR) 500 MG 24 hr tablet Take 1 tablet (500 mg total) by mouth daily with breakfast. 90 tablet 4   Multiple Vitamins-Calcium  (VIACTIV MULTI-VITAMIN) CHEW Chew 1 tablet by mouth daily.     Multiple Vitamins-Minerals (PRESERVISION AREDS PO) Take by mouth at bedtime.     omeprazole  (PRILOSEC) 40 MG capsule Take 1 capsule (40 mg total) by  mouth daily. 90 capsule 4   ONETOUCH DELICA LANCETS FINE MISC Use as directed - check as needed or 3 times weekly 100 each 1   triamterene -hydrochlorothiazide (MAXZIDE-25) 37.5-25 MG tablet Take 1 tablet by mouth daily. 90 tablet 4   valACYclovir  (VALTREX ) 1000 MG tablet SMARTSIG:2 Tablet(s) By Mouth Every 12 Hours     lisinopril  (ZESTRIL ) 10 MG tablet Take 1 tablet (10 mg total) by mouth daily. 90 tablet 4   Coenzyme Q10 100 MG capsule Take 1 capsule (100 mg total) by mouth daily.     No facility-administered medications prior to visit.     Per HPI unless specifically indicated in ROS section below Review of Systems  Objective:  BP (!) 164/68   Pulse 68   Temp 98.7 F (37.1 C) (Oral)   Ht 5' 3.25" (1.607 m)   Wt 183 lb 2 oz (83.1 kg)   SpO2 96%   BMI 32.18 kg/m   Wt Readings from Last 3 Encounters:  01/28/24 183 lb 2 oz (83.1 kg)  04/08/23 184 lb 6 oz (83.6 kg)  03/25/23 185 lb (83.9 kg)      Physical Exam   VITALS: BP- 164/64 HEENT: Oral cavity without signs of thrush.       Physical Exam Vitals and nursing note reviewed.  Constitutional:      Appearance: Normal appearance. She is not ill-appearing.  HENT:     Head: Normocephalic and atraumatic.     Comments: No reproducible pain to palpation of mastoid process bilaterally    Right Ear: Tympanic membrane, ear canal and external ear normal.     Left Ear: Tympanic membrane, ear canal and external ear normal.     Nose: Nose normal.     Mouth/Throat:     Mouth: Mucous membranes are moist.     Pharynx: Oropharynx is clear. No oropharyngeal exudate or posterior oropharyngeal erythema.     Comments: No white film to tongue or throat Eyes:     Extraocular Movements: Extraocular movements intact.     Conjunctiva/sclera: Conjunctivae normal.     Pupils: Pupils are equal, round, and reactive to light.  Neck:     Vascular: No carotid bruit.  Cardiovascular:     Rate and Rhythm: Normal rate and regular rhythm.     Pulses:  Normal pulses.     Heart sounds: Normal heart sounds. No murmur heard. Pulmonary:     Effort: Pulmonary effort is normal. No respiratory distress.     Breath sounds: Normal breath sounds. No wheezing, rhonchi or rales.  Abdominal:     General: Bowel sounds are normal. There is no distension.     Palpations: Abdomen is soft. There is no mass.     Tenderness: There is no abdominal tenderness. There is  no right CVA tenderness, left CVA tenderness, guarding or rebound.     Hernia: No hernia is present.  Musculoskeletal:     Cervical back: Normal range of motion and neck supple.     Right lower leg: No edema.     Left lower leg: No edema.  Lymphadenopathy:     Cervical: No cervical adenopathy.  Skin:    General: Skin is warm and dry.     Capillary Refill: Capillary refill takes less than 2 seconds.     Findings: No rash.  Neurological:     General: No focal deficit present.     Mental Status: She is alert.     Cranial Nerves: Cranial nerves 2-12 are intact.     Sensory: Sensation is intact.     Motor: Motor function is intact.     Coordination: Coordination is intact.     Gait: Gait is intact.     Comments:  CN 2-12 intact  FTN intact EOMI  Psychiatric:        Mood and Affect: Mood normal.        Behavior: Behavior normal.       Results   LABS A1c: 6.5% (01/28/2024)       Results for orders placed or performed in visit on 01/28/24  POCT glycosylated hemoglobin (Hb A1C)   Collection Time: 01/28/24  4:07 PM  Result Value Ref Range   Hemoglobin A1C 6.5 (A) 4.0 - 5.6 %   HbA1c POC (<> result, manual entry)     HbA1c, POC (prediabetic range)     HbA1c, POC (controlled diabetic range)    POCT Urinalysis Dipstick (Automated)   Collection Time: 01/28/24  4:42 PM  Result Value Ref Range   Color, UA yellow    Clarity, UA clear    Glucose, UA Negative Negative   Bilirubin, UA negative    Ketones, UA negative    Spec Grav, UA 1.015 1.010 - 1.025   Blood, UA negative    pH,  UA 6.0 5.0 - 8.0   Protein, UA Negative Negative   Urobilinogen, UA 0.2 0.2 or 1.0 E.U./dL   Nitrite, UA negative    Leukocytes, UA Negative Negative    Assessment & Plan:      Left ear pain Intermittent sharp pain behind left ear, possible neuralgia or neuritis. No signs of ear infection, not consistent with aneurysm. - Monitor symptoms. - Consider imaging if pain persists or worsens. - Consider trial of gabapentin  a nerve pain medicine  - Work towards better blood pressure control  Hypertension Blood pressure uncontrolled at 164/64 mmHg despite lisinopril  10 mg and Maxzide. - Increase lisinopril  to 20 mg daily. - Continue Maxzide. - Monitor blood pressure regularly. - Discuss lisinopril  side effects: dry cough, angioedema.  Diabetes Mellitus Type 2 A1c at 6.5%, diabetes well-controlled. Adherent to metformin  XR. - Check vitamin B12 level. - Continue metformin  XR 500mg  daily.  Fatigue Chronic fatigue over two months, possible thyroid  dysfunction or vitamin B12 deficiency. - Order thyroid  function and vitamin B12 tests.  Asthma Well-managed with Breo, occasional non-rinsing post-use. - Ensure rinsing mouth after Breo. - Consider timing Breo with tooth brushing.  Urinary symptoms Intermittent malodorous urine, no dysuria, urgency, or frequency. Recent urology visit in October. - Order urine test to rule out infection. - Consider urology referral if symptoms persist.          Problem List Items Addressed This Visit     Essential hypertension   Relevant Medications  lisinopril  (ZESTRIL ) 20 MG tablet   Other Relevant Orders   CBC with Differential/Platelet   Basic metabolic panel with GFR   Diabetes mellitus type 2 with complications (HCC)   Relevant Medications   lisinopril  (ZESTRIL ) 20 MG tablet   Other Relevant Orders   POCT glycosylated hemoglobin (Hb A1C) (Completed)   History of bladder cancer   Left ear pain - Primary   Other Visit Diagnoses        Chronic fatigue       Relevant Orders   TSH   Vitamin B12     Foul smelling urine       Relevant Orders   POCT Urinalysis Dipstick (Automated) (Completed)        Meds ordered this encounter  Medications   lisinopril  (ZESTRIL ) 20 MG tablet    Sig: Take 1 tablet (20 mg total) by mouth daily.    Dispense:  90 tablet    Refill:  1    Orders Placed This Encounter  Procedures   CBC with Differential/Platelet   Basic metabolic panel with GFR   TSH   Vitamin B12   POCT glycosylated hemoglobin (Hb A1C)   POCT Urinalysis Dipstick (Automated)    Patient Instructions  VISIT SUMMARY: Today, we discussed your left ear pain, blood pressure, diabetes, fatigue, asthma, and urinary symptoms. We reviewed your current medications and made some adjustments to better manage your conditions.  YOUR PLAN: -LEFT EAR PAIN: Your left ear pain may be due to nerve irritation. We will monitor your symptoms and consider imaging vs nerve pain medicine if the pain continues or worsens.  -HYPERTENSION: Your blood pressure is higher than desired. We will increase your lisinopril  dose to 20 mg daily and continue your current dose of Maxzide. Please monitor your blood pressure regularly and be aware of potential side effects like dry cough or swelling.  -DIABETES MELLITUS TYPE 2: Your diabetes is well-controlled with an A1c of 6.5%. Continue taking metformin  XR as prescribed. We will also check your vitamin B12 levels.  -FATIGUE: Your persistent fatigue may be due to thyroid  issues or a vitamin B12 deficiency. We will order tests to check your thyroid  function and vitamin B12 levels.  -ASTHMA: Your asthma is well-managed with Breo. Remember to rinse your mouth after using Breo to prevent thrush. You might find it helpful to time your Breo use with brushing your teeth.  -URINARY SYMPTOMS: You have noticed a bad smell in your urine. We will order a urine test to rule out any infection. If the symptoms continue,  we may refer you back to your urologist in history of bladder cancer.  INSTRUCTIONS: Please follow up with the recommended tests for thyroid  function and vitamin B12 levels and other labwork ordered today. Urinalysis was also checked today. Continue monitoring your blood pressure and blood sugar levels regularly. If your symptoms persist or worsen, contact our office for further evaluation.  Follow up plan: Return if symptoms worsen or fail to improve.  Claire Crick, MD

## 2024-01-29 ENCOUNTER — Ambulatory Visit: Admitting: Family Medicine

## 2024-01-29 LAB — CBC WITH DIFFERENTIAL/PLATELET
Basophils Absolute: 0.1 10*3/uL (ref 0.0–0.1)
Basophils Relative: 0.6 % (ref 0.0–3.0)
Eosinophils Absolute: 0.2 10*3/uL (ref 0.0–0.7)
Eosinophils Relative: 1.7 % (ref 0.0–5.0)
HCT: 38.9 % (ref 36.0–46.0)
Hemoglobin: 13 g/dL (ref 12.0–15.0)
Lymphocytes Relative: 16.3 % (ref 12.0–46.0)
Lymphs Abs: 2.2 10*3/uL (ref 0.7–4.0)
MCHC: 33.4 g/dL (ref 30.0–36.0)
MCV: 89.2 fl (ref 78.0–100.0)
Monocytes Absolute: 1.1 10*3/uL — ABNORMAL HIGH (ref 0.1–1.0)
Monocytes Relative: 8 % (ref 3.0–12.0)
Neutro Abs: 10.1 10*3/uL — ABNORMAL HIGH (ref 1.4–7.7)
Neutrophils Relative %: 73.4 % (ref 43.0–77.0)
Platelets: 304 10*3/uL (ref 150.0–400.0)
RBC: 4.36 Mil/uL (ref 3.87–5.11)
RDW: 13.7 % (ref 11.5–15.5)
WBC: 13.8 10*3/uL — ABNORMAL HIGH (ref 4.0–10.5)

## 2024-01-29 LAB — BASIC METABOLIC PANEL WITH GFR
BUN: 16 mg/dL (ref 6–23)
CO2: 25 meq/L (ref 19–32)
Calcium: 9.4 mg/dL (ref 8.4–10.5)
Chloride: 98 meq/L (ref 96–112)
Creatinine, Ser: 0.97 mg/dL (ref 0.40–1.20)
GFR: 57.45 mL/min — ABNORMAL LOW (ref >60.00)
Glucose, Bld: 88 mg/dL (ref 70–99)
Potassium: 4.5 meq/L (ref 3.5–5.1)
Sodium: 138 meq/L (ref 135–145)

## 2024-01-29 LAB — TSH: TSH: 2.16 u[IU]/mL (ref 0.35–5.50)

## 2024-01-29 LAB — VITAMIN B12: Vitamin B-12: 221 pg/mL (ref 211–911)

## 2024-02-04 DIAGNOSIS — E538 Deficiency of other specified B group vitamins: Secondary | ICD-10-CM | POA: Insufficient documentation

## 2024-02-04 MED ORDER — VITAMIN B-12 1000 MCG PO TABS
1000.0000 ug | ORAL_TABLET | Freq: Every day | ORAL | Status: DC
Start: 2024-02-04 — End: 2024-04-12

## 2024-02-05 ENCOUNTER — Other Ambulatory Visit (INDEPENDENT_AMBULATORY_CARE_PROVIDER_SITE_OTHER)

## 2024-02-05 DIAGNOSIS — I10 Essential (primary) hypertension: Secondary | ICD-10-CM | POA: Diagnosis not present

## 2024-02-06 LAB — BASIC METABOLIC PANEL WITH GFR
BUN: 16 mg/dL (ref 7–25)
CO2: 25 mmol/L (ref 20–32)
Calcium: 9.6 mg/dL (ref 8.6–10.4)
Chloride: 101 mmol/L (ref 98–110)
Creat: 0.98 mg/dL (ref 0.60–1.00)
Glucose, Bld: 133 mg/dL — ABNORMAL HIGH (ref 65–99)
Potassium: 4.4 mmol/L (ref 3.5–5.3)
Sodium: 137 mmol/L (ref 135–146)
eGFR: 61 mL/min/{1.73_m2} (ref >=60)

## 2024-02-09 ENCOUNTER — Ambulatory Visit: Payer: Self-pay | Admitting: Family Medicine

## 2024-02-24 DIAGNOSIS — E119 Type 2 diabetes mellitus without complications: Secondary | ICD-10-CM | POA: Diagnosis not present

## 2024-03-25 ENCOUNTER — Ambulatory Visit: Payer: PPO

## 2024-03-25 VITALS — Ht 63.25 in | Wt 183.0 lb

## 2024-03-25 DIAGNOSIS — Z0000 Encounter for general adult medical examination without abnormal findings: Secondary | ICD-10-CM | POA: Diagnosis not present

## 2024-03-25 NOTE — Patient Instructions (Signed)
 Jennifer Pineda , Thank you for taking time out of your busy schedule to complete your Annual Wellness Visit with me. I enjoyed our conversation and look forward to speaking with you again next year. I, as well as your care team,  appreciate your ongoing commitment to your health goals. Please review the following plan we discussed and let me know if I can assist you in the future. Your Game plan/ To Do List    Referrals: If you haven't heard from the office you've been referred to, please reach out to them at the phone provided.   Follow up Visits: We will see or speak with you next year for your Next Medicare AWV with our clinical staff Have you seen your provider in the last 6 months (3 months if uncontrolled diabetes)? Yes  Clinician Recommendations:  Aim for 30 minutes of exercise or brisk walking, 6-8 glasses of water, and 5 servings of fruits and vegetables each day.       This is a list of the screenings recommended for you:  Health Maintenance  Topic Date Due   Yearly kidney health urinalysis for diabetes  Never done   Colon Cancer Screening  10/14/2022   Eye exam for diabetics  04/12/2023   COVID-19 Vaccine (3 - 2024-25 season) 04/27/2023   Complete foot exam   09/24/2023   Flu Shot  03/26/2024   DTaP/Tdap/Td vaccine (3 - Td or Tdap) 05/11/2024   Hemoglobin A1C  07/29/2024   Mammogram  11/09/2024   Yearly kidney function blood test for diabetes  02/04/2025   Medicare Annual Wellness Visit  03/25/2025   Pneumococcal Vaccine for age over 62  Completed   DEXA scan (bone density measurement)  Completed   Hepatitis C Screening  Completed   Zoster (Shingles) Vaccine  Completed   Hepatitis B Vaccine  Aged Out   HPV Vaccine  Aged Out   Meningitis B Vaccine  Aged Out    Advanced directives: (In Chart) A copy of your advanced directives are scanned into your chart should your provider ever need it. Advance Care Planning is important because it:  [x]  Makes sure you receive the  medical care that is consistent with your values, goals, and preferences  [x]  It provides guidance to your family and loved ones and reduces their decisional burden about whether or not they are making the right decisions based on your wishes.  Follow the link provided in your after visit summary or read over the paperwork we have mailed to you to help you started getting your Advance Directives in place. If you need assistance in completing these, please reach out to us  so that we can help you!

## 2024-03-25 NOTE — Progress Notes (Signed)
 Please attest and cosign this visit due to patients primary care provider not being in the office at the time the visit was completed.    Subjective:   Jennifer Pineda is a 73 y.o. who presents for a Medicare Wellness preventive visit.  As a reminder, Annual Wellness Visits don't include a physical exam, and some assessments may be limited, especially if this visit is performed virtually. We may recommend an in-person follow-up visit with your provider if needed.  Visit Complete: Virtual I connected with  Jennifer Pineda on 03/25/24 by a audio enabled telemedicine application and verified that I am speaking with the correct person using two identifiers.  Patient Location: Home  Provider Location: Office/Clinic  I discussed the limitations of evaluation and management by telemedicine. The patient expressed understanding and agreed to proceed.  Vital Signs: Because this visit was a virtual/telehealth visit, some criteria may be missing or patient reported. Any vitals not documented were not able to be obtained and vitals that have been documented are patient reported.  VideoDeclined- This patient declined Librarian, academic. Therefore the visit was completed with audio only.  Persons Participating in Visit: Patient.  AWV Questionnaire: No: Patient Medicare AWV questionnaire was not completed prior to this visit.  Cardiac Risk Factors include: advanced age (>85men, >73 women);diabetes mellitus;dyslipidemia;hypertension;obesity (BMI >30kg/m2)    Objective:    Today's Vitals   03/25/24 1446 03/25/24 1452  Weight: 183 lb (83 kg)   Height: 5' 3.25 (1.607 m)   PainSc:  5    Body mass index is 32.16 kg/m.     03/25/2024    3:06 PM 03/25/2023   11:26 AM 03/21/2022    1:22 PM 03/13/2020   10:40 AM 02/18/2019   12:32 PM 08/21/2018    7:59 AM 11/11/2017    1:01 PM  Advanced Directives  Does Patient Have a Medical Advance Directive? Yes Yes Yes Yes Yes Yes   Yes   Type of Estate agent of [place];Living will Healthcare Power of [place];Living will Healthcare Power of [place];Living will Healthcare Power of [place];Living will Healthcare Power of [place];Living will Healthcare Power of [place];Living will Healthcare Power of [place];Living will  Does patient want to make changes to medical advance directive?  No - Patient declined Yes (Inpatient - patient defers changing a medical advance directive and declines information at this time)      Copy of Healthcare Power of Attorney in Chart? Yes - validated most recent copy scanned in chart (See row information) Yes - validated most recent copy scanned in chart (See row information) Yes - validated most recent copy scanned in chart (See row information) Yes - validated most recent copy scanned in chart (See row information) Yes - validated most recent copy scanned in chart (See row information)  No - copy requested  Yes      Data saved with a previous flowsheet row definition    Current Medications (verified) Outpatient Encounter Medications as of 03/25/2024  Medication Sig   albuterol  (VENTOLIN  HFA) 108 (90 Base) MCG/ACT inhaler Inhale 2 puffs into the lungs every 6 (six) hours as needed. For shortness of breath.   amLODipine  (NORVASC ) 10 MG tablet Take 1 tablet (10 mg total) by mouth daily.   aspirin  EC 81 MG tablet Take 1 tablet (81 mg total) by mouth daily. Swallow whole.   atorvastatin  (LIPITOR) 20 MG tablet Take 1 tablet (20 mg total) by mouth daily.   Blood Glucose Monitoring Suppl (ONE  TOUCH ULTRA SYSTEM KIT) w/Device KIT 1 kit by Does not apply route once.   BREO ELLIPTA  100-25 MCG/ACT AEPB Inhale 1 puff by mouth once daily   cyanocobalamin  (VITAMIN B12) 1000 MCG tablet Take 1 tablet (1,000 mcg total) by mouth daily.   FLUoxetine  (PROZAC ) 10 MG capsule Take 1 capsule (10 mg total) by mouth daily.   fluticasone  (CUTIVATE ) 0.05 % cream Apply topically. For lips    fluticasone  (FLONASE ) 50 MCG/ACT nasal spray Place 2 sprays into both nostrils daily.   glucose blood (ONE TOUCH ULTRA TEST) test strip 1 each by Other route daily. Use to check sugar once daily and as needed. Dx: E11.9   lisinopril  (ZESTRIL ) 20 MG tablet Take 1 tablet (20 mg total) by mouth daily.   loratadine  (CLARITIN ) 10 MG tablet Take 1 tablet (10 mg total) by mouth daily.   metFORMIN  (GLUCOPHAGE -XR) 500 MG 24 hr tablet Take 1 tablet (500 mg total) by mouth daily with breakfast.   Multiple Vitamins-Calcium  (VIACTIV MULTI-VITAMIN) CHEW Chew 1 tablet by mouth daily.   Multiple Vitamins-Minerals (PRESERVISION AREDS PO) Take by mouth at bedtime.   omeprazole  (PRILOSEC) 40 MG capsule Take 1 capsule (40 mg total) by mouth daily.   ONETOUCH DELICA LANCETS FINE MISC Use as directed - check as needed or 3 times weekly   triamterene -hydrochlorothiazide (MAXZIDE-25) 37.5-25 MG tablet Take 1 tablet by mouth daily.   valACYclovir  (VALTREX ) 1000 MG tablet SMARTSIG:2 Tablet(s) By Mouth Every 12 Hours   No facility-administered encounter medications on file as of 03/25/2024.    Allergies (verified) Levofloxacin, Doxycycline  hyclate, Metoprolol  succinate, Other, Tetracycline hcl, and Adhesive [tape]   History: Past Medical History:  Diagnosis Date   Allergic rhinitis    Allergy    Anxiety and depression    Arthritis    hands   Asthma, persistent    Basal cell carcinoma 12/2019   s/p excision, margins clear (Dr Elnor)   CAD (coronary artery disease)    per CT 06-02-2015   Complication of anesthesia    slow to wake   COPD, mild (HCC)    pulmologist--  dr jorie cha ([facility] )   COVID-19 virus infection 09/24/2019   Diet-controlled diabetes mellitus (HCC) 05/01/2011   Completed DSME 10/2015    Diverticulosis of colon    Essential hypertension    GERD (gastroesophageal reflux disease)    History of cellulitis    Nov 2015-- nasal cellulitis--  resolved   History of  endometriosis    History of hiatal hernia    Hyperlipidemia    OSA on CPAP    severe per study 05-09-2010   PONV (postoperative nausea and vomiting)    Shingles 12/29/2006   Sleep apnea    Urothelial carcinoma (HCC) 2016   superficial papillary L lateral bladder wall s/p BCG therapy ([facility])   Past Surgical History:  Procedure Laterality Date   ABDOMINAL HYSTERECTOMY  1983   and Appendectomy   CARDIOVASCULAR STRESS TEST  10/06/1998   no evidence ischemia or scar/  normal LV function and wall motion, ef 65%   COLONOSCOPY  09/2012   small int hem [name]) rpt 10 yrs   CYSTOSCOPY WITH BIOPSY N/A 08/15/2015   Procedure: CYSTOSCOPY WITH BLADDER RESECTION;  Surgeon: Donnice Brooks, MD;  [facility];  Service: Urology;  Laterality: N/A;   ESOPHAGOGASTRODUODENOSCOPY  last one 08/ 2001   LUMBAR LAMINECTOMY/DECOMPRESSION MICRODISCECTOMY  05/2012   SEPTOPLASTY  10/2000   SKIN SURGERY  01/12/2020  mole removed from back   SPINE SURGERY     TRANSURETHRAL RESECTION OF BLADDER TUMOR N/A 07/11/2015   Procedure: TRANSURETHRAL RESECTION OF BLADDER TUMOR (TURBT);  Surgeon: Donnice Brooks, MD;  Location[facility];  Service: Urology;  Laterality: N/A;   TRANSURETHRAL RESECTION OF BLADDER TUMOR N/A 08/15/2015   Procedure: TRANSURETHRAL RESECTION OF BLADDER TUMOR (TURBT);  Surgeon: Donnice Brooks, MD;  Location: [facility];  Service: Urology;  Laterality: N/A;   TRIGGER FINGER RELEASE Right 2014   Family History  Problem Relation Age of Onset   Cancer Mother        kidney and lymphoma CA, neprectomy tumor of lung, non smoker   Kidney disease Mother        T-cell cancer in kidney   Diabetes Father    Heart disease Father 63       MI   Diabetes Sister    Hyperlipidemia Sister    CAD Sister 69       4v CABG   Heart disease Brother 68       MI   Alcohol abuse Brother        smokes again   Hypertension Brother    Cancer  Brother 31       T-cell renal cancer/ removed   Hypertension Brother    Colon cancer Neg Hx    Esophageal cancer Neg Hx    Rectal cancer Neg Hx    Stomach cancer Neg Hx    Breast cancer Neg Hx    Social History   Socioeconomic History   Marital status: Divorced    Spouse name: Not on file   Number of children: 2   Years of education: Not on file   Highest education level: Not on file  Occupational History   Occupation: Lawyer: [employer]    Comment: [employer]  Tobacco Use   Smoking status: Former    Current packs/day: 0.00    Average packs/day: 2.0 packs/day for 19.0 years (38.0 ttl pk-yrs)    Types: Cigarettes    Start date: 08/27/1967    Quit date: 08/26/1986    Years since quitting: 37.6   Smokeless tobacco: Never  Vaping Use   Vaping status: Never Used  Substance and Sexual Activity   Alcohol use: Yes    Comment: 3 margaritas a week    Drug use: No   Sexual activity: Never  Other Topics Concern   Not on file  Social History Narrative   Divorced   Daughter [name], lives with her   Children: 2 children // one daughter lives with mother/ son lives with father   Activity: 20 min walk daily   Diet: good water, fruits/vegetables daily.    Social Drivers of Corporate investment banker Strain: Low Risk  (03/25/2024)   Overall Financial Resource Strain (CARDIA)    Difficulty of Paying Living Expenses: Not hard at all  Food Insecurity: No Food Insecurity (03/25/2024)   Hunger Vital Sign    Worried About Running Out of Food in the Last Year: Never true    Ran Out of Food in the Last Year: Never true  Transportation Needs: No Transportation Needs (03/25/2024)   PRAPARE - Administrator, Civil Service (Medical): No    Lack of Transportation (Non-Medical): No  Physical Activity: Insufficiently Active (03/25/2024)   Exercise Vital Sign    Days of Exercise per Week: 3 days  Minutes of Exercise per Session: 30 min   Stress: No Stress Concern Present (03/25/2024)   [employer] of Occupational Health - Occupational Stress Questionnaire    Feeling of Stress: Only a little  Social Connections: Moderately Isolated (03/25/2024)   Social Connection and Isolation Panel    Frequency of Communication with Friends and Family: More than three times a week    Frequency of Social Gatherings with Friends and Family: More than three times a week    Attends Religious Services: More than 4 times per year    Active Member of [employer] or Organizations: No    Attends Engineer, structural: Never    Marital Status: Divorced    Tobacco Counseling Counseling given: Not Answered    Clinical Intake:  Pre-visit preparation completed: Yes  Pain : 0-10 Pain Score: 5  Pain Location: Back Pain Orientation: Lower Pain Descriptors / Indicators: Aching Pain Onset: More than a month ago Pain Frequency: Intermittent Pain Relieving Factors: stretching  Pain Relieving Factors: stretching  BMI - recorded: 32.16 Nutritional Status: BMI > 30  Obese Nutritional Risks: None Diabetes: Yes CBG done?: Yes (BS 135 at home this morning) CBG resulted in Enter/ Edit results?: No Did pt. bring in CBG monitor from home?: No  Lab Results  Component Value Date   HGBA1C 6.5 (A) 01/28/2024   HGBA1C 6.4 03/20/2023   HGBA1C 6.3 (A) 09/23/2022     How often do you need to have someone help you when you read instructions, pamphlets, or other written materials from your doctor or pharmacy?: 1 - Never  Interpreter Needed?: No  Comments: daughter lives with pt Information entered by :: B.Leanny Moeckel,LPN   Activities of Daily Living     03/25/2024    3:07 PM  In your present state of health, do you have any difficulty performing the following activities:  Hearing? 0  Vision? 0  Difficulty concentrating or making decisions? 0  Walking or climbing stairs? 0  Dressing or bathing? 0  Doing errands, shopping? 0  Preparing  Food and eating ? N  Using the Toilet? N  In the past six months, have you accidently leaked urine? N  Do you have problems with loss of bowel control? N  Managing your Medications? N  Managing your Finances? N  Housekeeping or managing your Housekeeping? N    Patient Care Team: Rilla Baller, MD as PCP - General (Family Medicine) Dann Candyce RAMAN, MD as PCP - Cardiology (Cardiology) Broadus Bare, OD as Consulting Physician (Optometry) Nieves Cough, MD as Consulting Physician (Urology) Herminio Miu, MD as Consulting Physician (Otolaryngology) Theta Bloch, MD (Inactive) as Consulting Physician (Internal Medicine) Meuth, Lyle LABOR, PA-C (Inactive) as Physician Assistant (Physician Assistant) Myra Rosaline FALCON, [facility] (Inactive) as Pharmacist (Pharmacist)  I have updated your Care Teams any recent Medical Services you may have received from other providers in the past year.     Assessment:   This is a routine wellness examination for Aron.  Hearing/Vision screen Hearing Screening - Comments:: Pt says her hearing is good Vision Screening - Comments:: Pt says her vision is good w/glasses (readers only) Dr Gareld   Goals Addressed             This Visit's Progress    DIET - EAT MORE FRUITS AND VEGETABLES   On track    03/25/24     Patient Stated   Not on track    03/25/24-Lose weight       Depression Screen  03/25/2024    3:02 PM 01/28/2024    3:28 PM 03/25/2023   11:25 AM 09/23/2022   10:08 AM 03/21/2022    1:17 PM 03/20/2021    9:52 AM 03/13/2020   10:44 AM  PHQ 2/9 Scores  PHQ - 2 Score 0 0 0 0 0 0 0  PHQ- 9 Score  4  4 1 2  0    Fall Risk     03/25/2024    2:55 PM 01/28/2024    3:28 PM 03/25/2023   11:27 AM 09/23/2022   10:08 AM 03/21/2022    1:24 PM  Fall Risk   Falls in the past year? 0 0 1 1 0  Number falls in past yr: 0  1 1 0  Comment   fell when door shut on toe    Injury with Fall? 0  1 1 0  Comment   Right big toe fracture     Risk for fall due to : No Fall Risks  No Fall Risks  No Fall Risks  Follow up Education provided;Falls prevention discussed  Falls prevention discussed;Falls evaluation completed;Education provided  Falls evaluation completed      Data saved with a previous flowsheet row definition    MEDICARE RISK AT HOME:  Medicare Risk at Home Any stairs in or around the home?: Yes If so, are there any without handrails?: Yes Home free of loose throw rugs in walkways, pet beds, electrical cords, etc?: Yes Adequate lighting in your home to reduce risk of falls?: Yes Life alert?: No Use of a cane, walker or w/c?: No Grab bars in the bathroom?: Yes Shower chair or bench in shower?: No Elevated toilet seat or a handicapped toilet?: Yes  TIMED UP AND GO:  Was the test performed?  No  Cognitive Function: 6CIT completed    03/13/2020   10:47 AM 02/18/2019   12:45 PM 11/11/2017    1:01 PM 09/30/2016    2:19 PM  MMSE - Mini Mental State Exam  Orientation to time 5 5 5 5    Orientation to Place 5 5 5 5    Registration 3 3 3 3    Attention/ Calculation 5 0 0 0   Recall 3 3 3 3    Language- name 2 objects  0 0 0   Language- repeat 1 1 1 1   Language- follow 3 step command  0 3 3   Language- read & follow direction  0 0 0   Write a sentence  0 0 0   Copy design  0 0 0   Total score  17 20 20       Data saved with a previous flowsheet row definition        03/25/2024    3:07 PM 03/25/2023   11:29 AM  6CIT Screen  What Year? 0 points 0 points  What month? 0 points 0 points  What time? 0 points 0 points  Count back from 20 0 points 0 points  Months in reverse 0 points 0 points  Repeat phrase 0 points 4 points  Total Score 0 points 4 points    Immunizations Immunization History  Administered Date(s) Administered   Fluad Quad(high Dose 65+) 09/18/2020   Influenza, High Dose Seasonal PF 07/07/2017   Influenza,inj,Quad PF,6+ Mos 09/08/2015, 10/07/2016, 05/20/2018   Moderna Sars-Covid-2  Vaccination 10/31/2019, 12/01/2019   Pneumococcal Conjugate-13 05/11/2014   Pneumococcal Polysaccharide-23 09/08/2015   Td 02/11/2004   Tdap 05/11/2014   Zoster Recombinant(Shingrix) 04/04/2021, 08/06/2021  Zoster, Live 06/08/2014    Screening Tests Health Maintenance  Topic Date Due   Diabetic kidney evaluation - Urine ACR  Never done   Colonoscopy  10/14/2022   OPHTHALMOLOGY EXAM  04/12/2023   COVID-19 Vaccine (3 - 2024-25 season) 04/27/2023   FOOT EXAM  09/24/2023   INFLUENZA VACCINE  03/26/2024   DTaP/Tdap/Td (3 - Td or Tdap) 05/11/2024   HEMOGLOBIN A1C  07/29/2024   MAMMOGRAM  11/09/2024   Diabetic kidney evaluation - eGFR measurement  02/04/2025   Medicare Annual Wellness (AWV)  03/25/2025   Pneumococcal Vaccine: 50+ Years  Completed   DEXA SCAN  Completed   Hepatitis C Screening  Completed   Zoster Vaccines- Shingrix  Completed   Hepatitis B Vaccines  Aged Out   HPV VACCINES  Aged Out   Meningococcal B Vaccine  Aged Out    Health Maintenance  Health Maintenance Due  Topic Date Due   Diabetic kidney evaluation - Urine ACR  Never done   Colonoscopy  10/14/2022   OPHTHALMOLOGY EXAM  04/12/2023   COVID-19 Vaccine (3 - 2024-25 season) 04/27/2023   FOOT EXAM  09/24/2023   Health Maintenance Items Addressed: Pt says the cologuard was done this year   Additional Screening:  Vision Screening: Recommended annual ophthalmology exams for early detection of glaucoma and other disorders of the eye. Would you like a referral to an eye doctor? No    Dental Screening: Recommended annual dental exams for proper oral hygiene  Community Resource Referral / Chronic Care Management: CRR required this visit?  No   CCM required this visit?  No   Plan:    I have personally reviewed and noted the following in the patient's chart:   Medical and social history Use of alcohol, tobacco or illicit drugs  Current medications and supplements including opioid prescriptions.  Patient is not currently taking opioid prescriptions. Functional ability and status Nutritional status Physical activity Advanced directives List of other physicians Hospitalizations, surgeries, and ER visits in previous 12 months Vitals Screenings to include cognitive, depression, and falls Referrals and appointments  In addition, I have reviewed and discussed with patient certain preventive protocols, quality metrics, and best practice recommendations. A written personalized care plan for preventive services as well as general preventive health recommendations were provided to patient.   Erminio LITTIE Saris, LPN   [id number]   After Visit Summary: (MyChart) Due to this being a telephonic visit, the after visit summary with patients personalized plan was offered to patient via MyChart   Notes: Nothing significant to report at this time.

## 2024-03-26 DIAGNOSIS — E119 Type 2 diabetes mellitus without complications: Secondary | ICD-10-CM | POA: Diagnosis not present

## 2024-04-09 ENCOUNTER — Ambulatory Visit (INDEPENDENT_AMBULATORY_CARE_PROVIDER_SITE_OTHER): Payer: PPO | Admitting: Family Medicine

## 2024-04-09 ENCOUNTER — Encounter: Payer: Self-pay | Admitting: Family Medicine

## 2024-04-09 VITALS — BP 138/64 | HR 64 | Temp 98.2°F | Ht 63.5 in | Wt 181.5 lb

## 2024-04-09 DIAGNOSIS — E538 Deficiency of other specified B group vitamins: Secondary | ICD-10-CM

## 2024-04-09 DIAGNOSIS — E118 Type 2 diabetes mellitus with unspecified complications: Secondary | ICD-10-CM

## 2024-04-09 DIAGNOSIS — J3089 Other allergic rhinitis: Secondary | ICD-10-CM

## 2024-04-09 DIAGNOSIS — F341 Dysthymic disorder: Secondary | ICD-10-CM | POA: Diagnosis not present

## 2024-04-09 DIAGNOSIS — Z0000 Encounter for general adult medical examination without abnormal findings: Secondary | ICD-10-CM | POA: Diagnosis not present

## 2024-04-09 DIAGNOSIS — Z7984 Long term (current) use of oral hypoglycemic drugs: Secondary | ICD-10-CM

## 2024-04-09 DIAGNOSIS — E785 Hyperlipidemia, unspecified: Secondary | ICD-10-CM

## 2024-04-09 DIAGNOSIS — Z23 Encounter for immunization: Secondary | ICD-10-CM | POA: Diagnosis not present

## 2024-04-09 DIAGNOSIS — I10 Essential (primary) hypertension: Secondary | ICD-10-CM

## 2024-04-09 DIAGNOSIS — E1169 Type 2 diabetes mellitus with other specified complication: Secondary | ICD-10-CM | POA: Diagnosis not present

## 2024-04-09 DIAGNOSIS — Z7189 Other specified counseling: Secondary | ICD-10-CM

## 2024-04-09 DIAGNOSIS — J4530 Mild persistent asthma, uncomplicated: Secondary | ICD-10-CM

## 2024-04-09 DIAGNOSIS — G4733 Obstructive sleep apnea (adult) (pediatric): Secondary | ICD-10-CM

## 2024-04-09 DIAGNOSIS — E66811 Obesity, class 1: Secondary | ICD-10-CM

## 2024-04-09 DIAGNOSIS — K219 Gastro-esophageal reflux disease without esophagitis: Secondary | ICD-10-CM

## 2024-04-09 DIAGNOSIS — Z8551 Personal history of malignant neoplasm of bladder: Secondary | ICD-10-CM

## 2024-04-09 LAB — COMPREHENSIVE METABOLIC PANEL WITH GFR
ALT: 16 U/L (ref 0–35)
AST: 13 U/L (ref 0–37)
Albumin: 4.2 g/dL (ref 3.5–5.2)
Alkaline Phosphatase: 71 U/L (ref 39–117)
BUN: 15 mg/dL (ref 6–23)
CO2: 27 meq/L (ref 19–32)
Calcium: 9.2 mg/dL (ref 8.4–10.5)
Chloride: 100 meq/L (ref 96–112)
Creatinine, Ser: 0.78 mg/dL (ref 0.40–1.20)
GFR: 74.53 mL/min (ref >60.00)
Glucose, Bld: 115 mg/dL — ABNORMAL HIGH (ref 70–99)
Potassium: 4.2 meq/L (ref 3.5–5.1)
Sodium: 137 meq/L (ref 135–145)
Total Bilirubin: 0.5 mg/dL (ref 0.2–1.2)
Total Protein: 7.3 g/dL (ref 6.0–8.3)

## 2024-04-09 LAB — LIPID PANEL
Cholesterol: 182 mg/dL (ref 0–200)
HDL: 46.1 mg/dL (ref >39.00)
LDL Cholesterol: 93 mg/dL (ref 0–99)
NonHDL: 135.71
Total CHOL/HDL Ratio: 4
Triglycerides: 216 mg/dL — ABNORMAL HIGH (ref 0.0–149.0)
VLDL: 43.2 mg/dL — ABNORMAL HIGH (ref 0.0–40.0)

## 2024-04-09 LAB — MICROALBUMIN / CREATININE URINE RATIO
Creatinine,U: 125.1 mg/dL
Microalb Creat Ratio: 11 mg/g (ref 0.0–30.0)
Microalb, Ur: 1.4 mg/dL (ref 0.0–1.9)

## 2024-04-09 LAB — HEMOGLOBIN A1C: Hgb A1c MFr Bld: 6.8 % — ABNORMAL HIGH (ref 4.6–6.5)

## 2024-04-09 LAB — VITAMIN B12: Vitamin B-12: >1500 pg/mL — ABNORMAL HIGH (ref 211–911)

## 2024-04-09 MED ORDER — ATORVASTATIN CALCIUM 20 MG PO TABS: 10.0000 mg | ORAL_TABLET | Freq: Every day | ORAL | 3 refills | Status: DC

## 2024-04-09 MED ORDER — METFORMIN HCL ER 500 MG PO TB24: 500.0000 mg | ORAL_TABLET | Freq: Every day | ORAL | 3 refills | Status: AC

## 2024-04-09 MED ORDER — FLUOXETINE HCL 10 MG PO CAPS: 10.0000 mg | ORAL_CAPSULE | Freq: Every day | ORAL | 3 refills | Status: AC

## 2024-04-09 MED ORDER — COENZYME Q10 100 MG PO CAPS: 100.0000 mg | ORAL_CAPSULE | Freq: Every day | ORAL | Status: AC

## 2024-04-09 MED ORDER — ATORVASTATIN CALCIUM 20 MG PO TABS: 20.0000 mg | ORAL_TABLET | Freq: Every day | ORAL | 3 refills | Status: DC

## 2024-04-09 MED ORDER — FLUTICASONE PROPIONATE 50 MCG/ACT NA SUSP: 2.0000 | Freq: Every day | NASAL | 3 refills | Status: AC

## 2024-04-09 MED ORDER — OMEPRAZOLE 40 MG PO CPDR: 40.0000 mg | DELAYED_RELEASE_CAPSULE | Freq: Every day | ORAL | 3 refills | Status: AC

## 2024-04-09 MED ORDER — LORATADINE 10 MG PO TABS: 10.0000 mg | ORAL_TABLET | Freq: Every day | ORAL | 3 refills | Status: AC

## 2024-04-09 MED ORDER — ATORVASTATIN CALCIUM 10 MG PO TABS: 10.0000 mg | ORAL_TABLET | Freq: Every day | ORAL | 3 refills | Status: AC

## 2024-04-09 MED ORDER — TRIAMTERENE-HCTZ 37.5-25 MG PO TABS: 1.0000 | ORAL_TABLET | Freq: Every day | ORAL | 3 refills | Status: AC

## 2024-04-09 MED ORDER — BREO ELLIPTA 100-25 MCG/ACT IN AEPB: INHALATION_SPRAY | RESPIRATORY_TRACT | 3 refills | Status: AC

## 2024-04-09 MED ORDER — LISINOPRIL 20 MG PO TABS: 20.0000 mg | ORAL_TABLET | Freq: Every day | ORAL | 3 refills | Status: DC

## 2024-04-09 MED ORDER — AMLODIPINE BESYLATE 10 MG PO TABS: 10.0000 mg | ORAL_TABLET | Freq: Every day | ORAL | 3 refills | Status: AC

## 2024-04-09 NOTE — Assessment & Plan Note (Signed)
 Chronic, continues nightly CPAP use.

## 2024-04-09 NOTE — Assessment & Plan Note (Addendum)
 Chronic, update FLP on atorvastatin . She has only been taking 10mg  dose due to myalgias - rec trial CoQ10 supplement .  Goal LDL <70, ideally <55.  The 10-year ASCVD risk score (Arnett DK, et al., 2019) is: 37.3%   Values used to calculate the score:     Age: 73 years     Clincally relevant sex: Female     Is Non-Hispanic African American: No     Diabetic: Yes     Tobacco smoker: No     Systolic Blood Pressure: 138 mmHg     Is BP treated: Yes     HDL Cholesterol: 51.9 mg/dL     Total Cholesterol: 174 mg/dL

## 2024-04-09 NOTE — Assessment & Plan Note (Signed)
 Chronic. Update A1c on metformin  XR.

## 2024-04-09 NOTE — Patient Instructions (Addendum)
 Prevnar-20 today  Labs today  Try coenzyme q10 muscle supplement  Good to see you today Happy early birthday! Return as needed or in 1 year for next physical.

## 2024-04-09 NOTE — Assessment & Plan Note (Signed)
 Update levels on daily SL replacement.

## 2024-04-09 NOTE — Assessment & Plan Note (Signed)
 Previously discussed.

## 2024-04-09 NOTE — Progress Notes (Signed)
 Ph: [phone] Fax: [phone]   Patient ID: Jennifer Pineda, female    DOB: March 25, 1951, 73 y.o.   MRN: [id number]  This visit was conducted in person.  BP 138/64   Pulse 64   Temp 98.2 F (36.8 C) (Oral)   Ht 5' 3.5 (1.613 m)   Wt 181 lb 8 oz (82.3 kg)   SpO2 97%   BMI 31.65 kg/m    CC: CPE Subjective:   HPI: Jennifer Pineda is a 73 y.o. female presenting on 04/09/2024 for Annual Exam   Saw health advisor last month for medicare wellness visit. Note reviewed.  Lives in [place] , [place] to be closer to family.  Brother with liver cancer not doing well. Sister with 4v CABG last year.  Most of her family lives there.   No results found.  Flowsheet Row Office Visit from 04/09/2024 in [facility] [place]  PHQ-2 Total Score 0       04/09/2024    8:03 AM 03/25/2024    2:55 PM 01/28/2024    3:28 PM 03/25/2023   11:27 AM 09/23/2022   10:08 AM  Fall Risk   Falls in the past year? 0 0 0 1 1  Number falls in past yr: 0 0  1 1  Comment    fell when door shut on toe   Injury with Fall? 0 0  1 1  Comment    Right big toe fracture   Risk for fall due to :  No Fall Risks  No Fall Risks   Follow up  Education provided;Falls prevention discussed  Falls prevention discussed;Falls evaluation completed;Education provided    Intermittent L ear pain - overall doing better.   H/o bladder cancer s/p TURBT 07/2015. Sees urology yearly Jennifer Pineda) with yearly surveillance cystoscopy each fall.    OSA on CPAP (DME supplier [employer]) previously followed by Dr Linard. Needs new equipment. Uses about 4 hours/night.  Chronic cough managed with breo inhaler.   DM on metformin  XR - doing well with this, tolerating better than IR formulation.   HLD - continues atorvastatin  20mg  daily, unable to tolerate 40mg  dose. She's only taking 10mg  due to myalgias.    Strong fmhx CAD/MI (sister, brother, father, grandparents). Sister had 4v bypass 12/2021. She had cardiac  evaluation 2023 with coronary morphology CTA with calcium  score 1211 (96%) and plaque to LAD s/p FFR rec medical treatment.   Preventative: COLONOSCOPY Date: 09/2012 small int hem Jennifer Pineda) rpt 10 yrs.  Cologuard WNL 03/2023.  Well woman - last pap was 2010, all normal. Hysterectomy age 9yo for endometriosis/pelvic pain. Ovaries remain. No pelvic pain.  Mammogram - 10/2023 Birads1 @ [name]  DEXA 06/2014 - WNL Lung cancer screening - not eligible Flu - yearly COVID vaccine - Moderna 10/2019, 11/2019, no booster Prevnar-13 2015, pneumovax23 08/2015, prevnar-20 today  Td 2005, Tdap 2015 zostavax - 2015  shingrix - 03/2021, 07/2021 RSV - discussed  Advanced directives - in chart 08/2015. Would want son Jennifer Pineda) to be HCPOA. No prolonged life support if terminal condition. Full code, ok with temporary ventilation.  Seat belt use discussed Sunscreen use discussed. No changing moles on skin. Sees derm yearly s/p BCC removed 2021  Sleep - averaging 6 hours/night due to dogs waking her up Ex smoker - quit 1988  Alcohol - 1-2 glasses wine per week  Eye exam yearly - h/o macular degeneration  Dentist q6 mo ([facility]) Bowel - no constipation  Bladder -  some urge incontinence, nocturia x2-3   Daughter Jennifer Pineda lives with her  Children: 2 children, one daughter lives with mother, son lives with father Activity: 20 min walk daily  Diet: good water, fruits/vegetables daily.      Relevant past medical, surgical, family and social history reviewed and updated as indicated. Interim medical history since our last visit reviewed. Allergies and medications reviewed and updated. Outpatient Medications Prior to Visit  Medication Sig Dispense Refill   albuterol  (VENTOLIN  HFA) 108 (90 Base) MCG/ACT inhaler Inhale 2 puffs into the lungs every 6 (six) hours as needed. For shortness of breath. 18 g 6   aspirin  EC 81 MG tablet Take 1 tablet (81 mg total) by mouth daily. Swallow whole.      Blood Glucose Monitoring Suppl (ONE TOUCH ULTRA SYSTEM KIT) w/Device KIT 1 kit by Does not apply route once. 1 each 0   cyanocobalamin  (VITAMIN B12) 1000 MCG tablet Take 1 tablet (1,000 mcg total) by mouth daily.     fluticasone  (CUTIVATE ) 0.05 % cream Apply topically. For lips     glucose blood (ONE TOUCH ULTRA TEST) test strip 1 each by Other route daily. Use to check sugar once daily and as needed. Dx: E11.9 100 each 3   ibuprofen (ADVIL) 600 MG tablet Take 600 mg by mouth every 6 (six) hours as needed.     Multiple Vitamins-Calcium  (VIACTIV MULTI-VITAMIN) CHEW Chew 1 tablet by mouth daily.     Multiple Vitamins-Minerals (PRESERVISION AREDS PO) Take by mouth at bedtime.     ONETOUCH DELICA LANCETS FINE MISC Use as directed - check as needed or 3 times weekly 100 each 1   oxyCODONE (OXY IR/ROXICODONE) 5 MG immediate release tablet Take 5 mg by mouth every 6 (six) hours as needed.     valACYclovir  (VALTREX ) 1000 MG tablet SMARTSIG:2 Tablet(s) By Mouth Every 12 Hours     amLODipine  (NORVASC ) 10 MG tablet Take 1 tablet (10 mg total) by mouth daily. 90 tablet 4   atorvastatin  (LIPITOR) 20 MG tablet Take 1 tablet (20 mg total) by mouth daily. 90 tablet 3   BREO ELLIPTA  100-25 MCG/ACT AEPB Inhale 1 puff by mouth once daily 180 each 3   FLUoxetine  (PROZAC ) 10 MG capsule Take 1 capsule (10 mg total) by mouth daily. 90 capsule 4   fluticasone  (FLONASE ) 50 MCG/ACT nasal spray Place 2 sprays into both nostrils daily. 48 g 3   lisinopril  (ZESTRIL ) 20 MG tablet Take 1 tablet (20 mg total) by mouth daily. 90 tablet 1   loratadine  (CLARITIN ) 10 MG tablet Take 1 tablet (10 mg total) by mouth daily. 90 tablet 4   metFORMIN  (GLUCOPHAGE -XR) 500 MG 24 hr tablet Take 1 tablet (500 mg total) by mouth daily with breakfast. 90 tablet 4   omeprazole  (PRILOSEC) 40 MG capsule Take 1 capsule (40 mg total) by mouth daily. 90 capsule 4   triamterene -hydrochlorothiazide (MAXZIDE-25) 37.5-25 MG tablet Take 1 tablet by mouth  daily. 90 tablet 4   No facility-administered medications prior to visit.     Per HPI unless specifically indicated in ROS section below Review of Systems  Constitutional:  Negative for activity change, appetite change, chills, fatigue, fever and unexpected weight change.  HENT:  Negative for hearing loss.   Eyes:  Negative for visual disturbance.  Respiratory:  Negative for cough, chest tightness, shortness of breath and wheezing.   Cardiovascular:  Positive for palpitations (occ at night about once a week). Negative for chest pain and  leg swelling.  Gastrointestinal:  Negative for abdominal distention, abdominal pain, blood in stool, constipation, diarrhea, nausea and vomiting.  Genitourinary:  Negative for difficulty urinating and hematuria.  Musculoskeletal:  Negative for arthralgias, myalgias and neck pain.  Skin:  Negative for rash.  Neurological:  Negative for dizziness, seizures, syncope and headaches.  Hematological:  Negative for adenopathy. Bruises/bleeds easily.  Psychiatric/Behavioral:  Negative for dysphoric mood. The patient is not nervous/anxious.     Objective:  BP 138/64   Pulse 64   Temp 98.2 F (36.8 C) (Oral)   Ht 5' 3.5 (1.613 m)   Wt 181 lb 8 oz (82.3 kg)   SpO2 97%   BMI 31.65 kg/m   Wt Readings from Last 3 Encounters:  04/09/24 181 lb 8 oz (82.3 kg)  03/25/24 183 lb (83 kg)  01/28/24 183 lb 2 oz (83.1 kg)      Physical Exam Vitals and nursing note reviewed.  Constitutional:      Appearance: Normal appearance. She is not ill-appearing.  HENT:     Head: Normocephalic and atraumatic.     Right Ear: Tympanic membrane, ear canal and external ear normal. There is no impacted cerumen.     Left Ear: Tympanic membrane, ear canal and external ear normal. There is no impacted cerumen.     Mouth/Throat:     Mouth: Mucous membranes are moist.     Pharynx: Oropharynx is clear. No oropharyngeal exudate or posterior oropharyngeal erythema.  Eyes:      General:        Right eye: No discharge.        Left eye: No discharge.     Extraocular Movements: Extraocular movements intact.     Conjunctiva/sclera: Conjunctivae normal.     Pupils: Pupils are equal, round, and reactive to light.  Neck:     Thyroid : No thyroid  mass or thyromegaly.     Vascular: No carotid bruit.  Cardiovascular:     Rate and Rhythm: Normal rate and regular rhythm.     Pulses: Normal pulses.     Heart sounds: Normal heart sounds. No murmur heard. Pulmonary:     Effort: Pulmonary effort is normal. No respiratory distress.     Breath sounds: Normal breath sounds. No wheezing, rhonchi or rales.  Abdominal:     General: Bowel sounds are normal. There is no distension.     Palpations: Abdomen is soft. There is no mass.     Tenderness: There is no abdominal tenderness. There is no guarding or rebound.     Hernia: No hernia is present.  Musculoskeletal:     Cervical back: Normal range of motion and neck supple. No rigidity.     Right lower leg: No edema.     Left lower leg: No edema.  Lymphadenopathy:     Cervical: No cervical adenopathy.  Skin:    General: Skin is warm and dry.     Findings: No rash.  Neurological:     General: No focal deficit present.     Mental Status: She is alert. Mental status is at baseline.  Psychiatric:        Mood and Affect: Mood normal.        Behavior: Behavior normal.       Results for orders placed or performed in visit on 02/05/24  Basic metabolic panel with GFR   Collection Time: 02/05/24  1:51 PM  Result Value Ref Range   Glucose, Bld 133 (H) 65 - 99 mg/dL  BUN 16 7 - 25 mg/dL   Creat 9.01 9.39 - 8.99 mg/dL   eGFR 61 > OR = 60 fO/fpw/8.26f7   BUN/Creatinine Ratio SEE NOTE: 6 - 22 (calc)   Sodium 137 135 - 146 mmol/L   Potassium 4.4 3.5 - 5.3 mmol/L   Chloride 101 98 - 110 mmol/L   CO2 25 20 - 32 mmol/L   Calcium  9.6 8.6 - 10.4 mg/dL    Assessment & Plan:   Problem List Items Addressed This Visit     Health  maintenance examination - Primary (Chronic)   Preventative protocols reviewed and updated unless pt declined. Discussed healthy diet and lifestyle.       Advanced care planning/counseling discussion (Chronic)   Previously discussed      Hyperlipidemia associated with type 2 diabetes mellitus (HCC)   Chronic, update FLP on atorvastatin . She has only been taking 10mg  dose due to myalgias - rec trial CoQ10 supplement .  Goal LDL <70, ideally <55.  The 10-year ASCVD risk score (Arnett DK, et al., 2019) is: 37.3%   Values used to calculate the score:     Age: 62 years     Clincally relevant sex: Female     Is Non-Hispanic African American: No     Diabetic: Yes     Tobacco smoker: No     Systolic Blood Pressure: 138 mmHg     Is BP treated: Yes     HDL Cholesterol: 51.9 mg/dL     Total Cholesterol: 174 mg/dL       Relevant Medications   amLODipine  (NORVASC ) 10 MG tablet   lisinopril  (ZESTRIL ) 20 MG tablet   metFORMIN  (GLUCOPHAGE -XR) 500 MG 24 hr tablet   triamterene -hydrochlorothiazide (MAXZIDE-25) 37.5-25 MG tablet   atorvastatin  (LIPITOR) 10 MG tablet   Other Relevant Orders   Lipid panel   Comprehensive metabolic panel with GFR   ANXIETY DEPRESSION   Chronic, stable period on low dose prozac  10mg  daily.       Relevant Medications   FLUoxetine  (PROZAC ) 10 MG capsule   Obstructive sleep apnea   Chronic, continues nightly CPAP use.       Essential hypertension   Chronic, stable. Continue current regimen.       Relevant Medications   amLODipine  (NORVASC ) 10 MG tablet   lisinopril  (ZESTRIL ) 20 MG tablet   triamterene -hydrochlorothiazide (MAXZIDE-25) 37.5-25 MG tablet   atorvastatin  (LIPITOR) 10 MG tablet   Allergic rhinitis   Continue daily antihistamine and flonase .       Relevant Medications   fluticasone  (FLONASE ) 50 MCG/ACT nasal spray   GERD   Chronic, stable on daily omeprazole  40mg .  On this for 10+ years..  EGD 2001 - 3cm sliding HH, mild reflux  esophagitis, gastritis, duodenitis       Relevant Medications   omeprazole  (PRILOSEC) 40 MG capsule   Diabetes mellitus type 2 with complications (HCC)   Chronic. Update A1c on metformin  XR.       Relevant Medications   lisinopril  (ZESTRIL ) 20 MG tablet   metFORMIN  (GLUCOPHAGE -XR) 500 MG 24 hr tablet   atorvastatin  (LIPITOR) 10 MG tablet   Other Relevant Orders   Hemoglobin A1c   Microalbumin / creatinine urine ratio   Vitamin B12   Persistent asthma without complication   Chronic, stable on breo daily.       Relevant Medications   BREO ELLIPTA  100-25 MCG/ACT AEPB   History of bladder cancer   Regularly sees uro with yearly cystoscopy ([facility])  Obesity, Class I, BMI 30-34.9   Reviewed healthy diet and lifestyle changes to effect sustainable weight loss.        Low serum vitamin B12   Update levels on daily SL replacement.       Relevant Orders   Vitamin B12   Other Visit Diagnoses       Need for vaccination against Streptococcus pneumoniae       Relevant Orders   Pneumococcal conjugate vaccine 20-valent (Prevnar 20) (Completed)        Meds ordered this encounter  Medications   amLODipine  (NORVASC ) 10 MG tablet    Sig: Take 1 tablet (10 mg total) by mouth daily.    Dispense:  90 tablet    Refill:  3   DISCONTD: atorvastatin  (LIPITOR) 20 MG tablet    Sig: Take 1 tablet (20 mg total) by mouth daily.    Dispense:  90 tablet    Refill:  3   BREO ELLIPTA  100-25 MCG/ACT AEPB    Sig: Inhale 1 puff by mouth once daily    Dispense:  180 each    Refill:  3   FLUoxetine  (PROZAC ) 10 MG capsule    Sig: Take 1 capsule (10 mg total) by mouth daily.    Dispense:  90 capsule    Refill:  3   fluticasone  (FLONASE ) 50 MCG/ACT nasal spray    Sig: Place 2 sprays into both nostrils daily.    Dispense:  48 g    Refill:  3   lisinopril  (ZESTRIL ) 20 MG tablet    Sig: Take 1 tablet (20 mg total) by mouth daily.    Dispense:  90 tablet    Refill:  3    loratadine  (CLARITIN ) 10 MG tablet    Sig: Take 1 tablet (10 mg total) by mouth daily.    Dispense:  90 tablet    Refill:  3   metFORMIN  (GLUCOPHAGE -XR) 500 MG 24 hr tablet    Sig: Take 1 tablet (500 mg total) by mouth daily with breakfast.    Dispense:  90 tablet    Refill:  3   omeprazole  (PRILOSEC) 40 MG capsule    Sig: Take 1 capsule (40 mg total) by mouth daily.    Dispense:  90 capsule    Refill:  3   triamterene -hydrochlorothiazide (MAXZIDE-25) 37.5-25 MG tablet    Sig: Take 1 tablet by mouth daily.    Dispense:  90 tablet    Refill:  3   DISCONTD: atorvastatin  (LIPITOR) 20 MG tablet    Sig: Take 0.5 tablets (10 mg total) by mouth daily.    Dispense:  90 tablet    Refill:  3    Note new dose   atorvastatin  (LIPITOR) 10 MG tablet    Sig: Take 1 tablet (10 mg total) by mouth daily.    Dispense:  90 tablet    Refill:  3    Use this dose   Coenzyme Q10 100 MG capsule    Sig: Take 1 capsule (100 mg total) by mouth daily. Take with meal    Orders Placed This Encounter  Procedures   Pneumococcal conjugate vaccine 20-valent (Prevnar 20)   Lipid panel   Comprehensive metabolic panel with GFR   Hemoglobin A1c   Microalbumin / creatinine urine ratio   Vitamin B12    Patient Instructions  Prevnar-20 today  Labs today  Try coenzyme q10 muscle supplement  Good to see you today Happy early birthday! Return as needed  or in 1 year for next physical.   Follow up plan: Return in about 1 year (around 04/09/2025), or if symptoms worsen or fail to improve, for medicare wellness visit, annual exam, prior fasting for blood work.  Anton Blas, MD

## 2024-04-09 NOTE — Assessment & Plan Note (Signed)
 Continue daily antihistamine and flonase .

## 2024-04-09 NOTE — Assessment & Plan Note (Addendum)
 Chronic, stable period on low dose prozac  10mg  daily.

## 2024-04-09 NOTE — Assessment & Plan Note (Signed)
 Chronic, stable. Continue current regimen.

## 2024-04-09 NOTE — Assessment & Plan Note (Signed)
 Regularly sees uro with yearly cystoscopy ([facility])

## 2024-04-09 NOTE — Assessment & Plan Note (Signed)
 Chronic, stable on breo daily.

## 2024-04-09 NOTE — Assessment & Plan Note (Signed)
 Preventative protocols reviewed and updated unless pt declined. Discussed healthy diet and lifestyle.

## 2024-04-09 NOTE — Assessment & Plan Note (Addendum)
 Reviewed healthy diet and lifestyle changes to effect sustainable weight loss.

## 2024-04-09 NOTE — Assessment & Plan Note (Addendum)
 Chronic, stable on daily omeprazole  40mg .  On this for 10+ years..  EGD 2001 - 3cm sliding HH, mild reflux esophagitis, gastritis, duodenitis

## 2024-04-12 ENCOUNTER — Ambulatory Visit: Payer: Self-pay | Admitting: Family Medicine

## 2024-04-12 MED ORDER — VITAMIN B-12 1000 MCG PO TABS: 1000.0000 ug | ORAL_TABLET | ORAL | Status: AC

## 2024-04-26 DIAGNOSIS — E119 Type 2 diabetes mellitus without complications: Secondary | ICD-10-CM | POA: Diagnosis not present

## 2024-05-26 DIAGNOSIS — E119 Type 2 diabetes mellitus without complications: Secondary | ICD-10-CM | POA: Diagnosis not present

## 2024-06-04 NOTE — Progress Notes (Signed)
 Jennifer Pineda                                          MRN: [id number]   06/04/2024   The [facility] Quality Team Specialist reviewed this patient medical record for the purposes of chart review for care gap closure. The following were reviewed: abstraction for care gap closure-kidney health evaluation for diabetes:eGFR  and uACR.    [facility] Quality Team

## 2024-06-14 ENCOUNTER — Other Ambulatory Visit: Payer: Self-pay | Admitting: Family Medicine

## 2024-06-14 DIAGNOSIS — I10 Essential (primary) hypertension: Secondary | ICD-10-CM

## 2024-06-16 ENCOUNTER — Other Ambulatory Visit: Payer: Self-pay | Admitting: Family Medicine

## 2024-06-16 DIAGNOSIS — I10 Essential (primary) hypertension: Secondary | ICD-10-CM

## 2024-06-16 NOTE — Telephone Encounter (Signed)
 Pt has not been seen since 09/23/22 and no appointments scheduled. Ok to refill?

## 2024-06-21 NOTE — Telephone Encounter (Signed)
 I last saw her 03/2024.  Refilled with latest dose 20mg  tablets.

## 2024-06-26 DIAGNOSIS — E119 Type 2 diabetes mellitus without complications: Secondary | ICD-10-CM | POA: Diagnosis not present

## 2024-08-04 DIAGNOSIS — Z8551 Personal history of malignant neoplasm of bladder: Secondary | ICD-10-CM | POA: Diagnosis not present

## 2024-08-11 DIAGNOSIS — H5203 Hypermetropia, bilateral: Secondary | ICD-10-CM | POA: Diagnosis not present

## 2025-03-28 ENCOUNTER — Ambulatory Visit

## 2025-04-11 ENCOUNTER — Encounter: Admitting: Family Medicine
